# Patient Record
Sex: Female | Born: 1940 | Race: White | Hispanic: No | Marital: Married | Smoking: Former smoker
Health system: Southern US, Community
[De-identification: ages and names within clinical notes are randomized; demographics above are authoritative.]

## PROBLEM LIST (undated history)

## (undated) DIAGNOSIS — D126 Benign neoplasm of colon, unspecified: Secondary | ICD-10-CM

## (undated) DIAGNOSIS — K5909 Other constipation: Secondary | ICD-10-CM

## (undated) DIAGNOSIS — E079 Disorder of thyroid, unspecified: Secondary | ICD-10-CM

## (undated) DIAGNOSIS — K5792 Diverticulitis of intestine, part unspecified, without perforation or abscess without bleeding: Secondary | ICD-10-CM

## (undated) DIAGNOSIS — R51 Headache: Secondary | ICD-10-CM

## (undated) DIAGNOSIS — K317 Polyp of stomach and duodenum: Secondary | ICD-10-CM

## (undated) DIAGNOSIS — I10 Essential (primary) hypertension: Secondary | ICD-10-CM

## (undated) DIAGNOSIS — K8020 Calculus of gallbladder without cholecystitis without obstruction: Secondary | ICD-10-CM

## (undated) DIAGNOSIS — E538 Deficiency of other specified B group vitamins: Secondary | ICD-10-CM

## (undated) DIAGNOSIS — R519 Headache, unspecified: Secondary | ICD-10-CM

## (undated) DIAGNOSIS — K449 Diaphragmatic hernia without obstruction or gangrene: Secondary | ICD-10-CM

## (undated) DIAGNOSIS — M1990 Unspecified osteoarthritis, unspecified site: Secondary | ICD-10-CM

## (undated) DIAGNOSIS — K219 Gastro-esophageal reflux disease without esophagitis: Secondary | ICD-10-CM

## (undated) DIAGNOSIS — E785 Hyperlipidemia, unspecified: Secondary | ICD-10-CM

## (undated) DIAGNOSIS — K5790 Diverticulosis of intestine, part unspecified, without perforation or abscess without bleeding: Secondary | ICD-10-CM

## (undated) HISTORY — DX: Benign neoplasm of colon, unspecified: D12.6

## (undated) HISTORY — DX: Disorder of thyroid, unspecified: E07.9

## (undated) HISTORY — PX: VAGINAL HYSTERECTOMY: SUR661

## (undated) HISTORY — DX: Other constipation: K59.09

## (undated) HISTORY — DX: Unspecified osteoarthritis, unspecified site: M19.90

## (undated) HISTORY — PX: COLON SURGERY: SHX602

## (undated) HISTORY — PX: TONSILLECTOMY: SUR1361

## (undated) HISTORY — DX: Diaphragmatic hernia without obstruction or gangrene: K44.9

## (undated) HISTORY — DX: Calculus of gallbladder without cholecystitis without obstruction: K80.20

## (undated) HISTORY — DX: Diverticulitis of intestine, part unspecified, without perforation or abscess without bleeding: K57.92

## (undated) HISTORY — DX: Hyperlipidemia, unspecified: E78.5

## (undated) HISTORY — DX: Polyp of stomach and duodenum: K31.7

## (undated) HISTORY — DX: Headache, unspecified: R51.9

## (undated) HISTORY — PX: JOINT REPLACEMENT: SHX530

## (undated) HISTORY — DX: Deficiency of other specified B group vitamins: E53.8

## (undated) HISTORY — DX: Gastro-esophageal reflux disease without esophagitis: K21.9

## (undated) HISTORY — DX: Essential (primary) hypertension: I10

## (undated) HISTORY — DX: Headache: R51

## (undated) HISTORY — DX: Diverticulosis of intestine, part unspecified, without perforation or abscess without bleeding: K57.90

---

## 1997-10-02 ENCOUNTER — Ambulatory Visit (HOSPITAL_BASED_OUTPATIENT_CLINIC_OR_DEPARTMENT_OTHER): Admission: RE | Admit: 1997-10-02 | Discharge: 1997-10-02 | Payer: Self-pay | Admitting: Plastic Surgery

## 1998-06-06 ENCOUNTER — Other Ambulatory Visit: Admission: RE | Admit: 1998-06-06 | Discharge: 1998-06-06 | Payer: Self-pay | Admitting: Gastroenterology

## 1999-06-23 ENCOUNTER — Ambulatory Visit (HOSPITAL_COMMUNITY): Admission: RE | Admit: 1999-06-23 | Discharge: 1999-06-23 | Payer: Self-pay | Admitting: Obstetrics and Gynecology

## 1999-06-23 ENCOUNTER — Encounter (INDEPENDENT_AMBULATORY_CARE_PROVIDER_SITE_OTHER): Payer: Self-pay | Admitting: Specialist

## 2000-04-05 HISTORY — PX: ABDOMINAL HYSTERECTOMY: SUR658

## 2000-05-17 ENCOUNTER — Encounter (INDEPENDENT_AMBULATORY_CARE_PROVIDER_SITE_OTHER): Payer: Self-pay | Admitting: Specialist

## 2000-05-17 ENCOUNTER — Ambulatory Visit (HOSPITAL_COMMUNITY): Admission: RE | Admit: 2000-05-17 | Discharge: 2000-05-17 | Payer: Self-pay | Admitting: Internal Medicine

## 2000-05-26 ENCOUNTER — Encounter: Payer: Self-pay | Admitting: Gynecology

## 2000-05-27 ENCOUNTER — Encounter (INDEPENDENT_AMBULATORY_CARE_PROVIDER_SITE_OTHER): Payer: Self-pay | Admitting: Specialist

## 2000-05-27 ENCOUNTER — Inpatient Hospital Stay (HOSPITAL_COMMUNITY): Admission: RE | Admit: 2000-05-27 | Discharge: 2000-05-28 | Payer: Self-pay | Admitting: Gynecology

## 2001-01-12 ENCOUNTER — Encounter: Payer: Self-pay | Admitting: Obstetrics and Gynecology

## 2001-01-12 ENCOUNTER — Encounter: Admission: RE | Admit: 2001-01-12 | Discharge: 2001-01-12 | Payer: Self-pay | Admitting: Obstetrics and Gynecology

## 2001-12-21 ENCOUNTER — Other Ambulatory Visit: Admission: RE | Admit: 2001-12-21 | Discharge: 2001-12-21 | Payer: Self-pay | Admitting: Gynecology

## 2002-04-03 ENCOUNTER — Encounter: Payer: Self-pay | Admitting: Gynecology

## 2002-04-03 ENCOUNTER — Encounter: Admission: RE | Admit: 2002-04-03 | Discharge: 2002-04-03 | Payer: Self-pay | Admitting: Gynecology

## 2002-12-24 ENCOUNTER — Other Ambulatory Visit: Admission: RE | Admit: 2002-12-24 | Discharge: 2002-12-24 | Payer: Self-pay | Admitting: Gynecology

## 2002-12-27 ENCOUNTER — Encounter: Payer: Self-pay | Admitting: Gynecology

## 2002-12-27 ENCOUNTER — Encounter: Admission: RE | Admit: 2002-12-27 | Discharge: 2002-12-27 | Payer: Self-pay | Admitting: Gynecology

## 2003-04-06 HISTORY — PX: TOTAL KNEE ARTHROPLASTY: SHX125

## 2003-10-14 ENCOUNTER — Encounter: Admission: RE | Admit: 2003-10-14 | Discharge: 2003-10-14 | Payer: Self-pay | Admitting: Internal Medicine

## 2003-12-31 ENCOUNTER — Other Ambulatory Visit: Admission: RE | Admit: 2003-12-31 | Discharge: 2003-12-31 | Payer: Self-pay | Admitting: Gynecology

## 2004-01-09 ENCOUNTER — Encounter: Payer: Self-pay | Admitting: Gastroenterology

## 2004-01-16 ENCOUNTER — Encounter: Admission: RE | Admit: 2004-01-16 | Discharge: 2004-01-16 | Payer: Self-pay | Admitting: Gynecology

## 2004-01-29 ENCOUNTER — Encounter (INDEPENDENT_AMBULATORY_CARE_PROVIDER_SITE_OTHER): Payer: Self-pay | Admitting: *Deleted

## 2004-01-29 ENCOUNTER — Encounter: Payer: Self-pay | Admitting: Gastroenterology

## 2004-01-29 ENCOUNTER — Ambulatory Visit (HOSPITAL_COMMUNITY): Admission: RE | Admit: 2004-01-29 | Discharge: 2004-01-29 | Payer: Self-pay | Admitting: Gastroenterology

## 2005-01-06 ENCOUNTER — Other Ambulatory Visit: Admission: RE | Admit: 2005-01-06 | Discharge: 2005-01-06 | Payer: Self-pay | Admitting: Gynecology

## 2005-03-15 ENCOUNTER — Encounter: Admission: RE | Admit: 2005-03-15 | Discharge: 2005-03-15 | Payer: Self-pay | Admitting: Gynecology

## 2006-01-24 ENCOUNTER — Other Ambulatory Visit: Admission: RE | Admit: 2006-01-24 | Discharge: 2006-01-24 | Payer: Self-pay | Admitting: Gynecology

## 2006-03-16 ENCOUNTER — Encounter: Admission: RE | Admit: 2006-03-16 | Discharge: 2006-03-16 | Payer: Self-pay | Admitting: Gynecology

## 2006-04-19 ENCOUNTER — Ambulatory Visit: Payer: Self-pay | Admitting: Gastroenterology

## 2006-04-19 LAB — CONVERTED CEMR LAB
ALT: 13 units/L (ref 0–40)
AST: 16 units/L (ref 0–37)
Albumin: 3.9 g/dL (ref 3.5–5.2)
Alkaline Phosphatase: 21 units/L — ABNORMAL LOW (ref 39–117)
BUN: 21 mg/dL (ref 6–23)
Basophils Absolute: 0 10*3/uL (ref 0.0–0.1)
Basophils Relative: 0.5 % (ref 0.0–1.0)
CO2: 31 meq/L (ref 19–32)
Calcium: 10.4 mg/dL (ref 8.4–10.5)
Chloride: 102 meq/L (ref 96–112)
Creatinine, Ser: 1.3 mg/dL — ABNORMAL HIGH (ref 0.4–1.2)
Eosinophils Relative: 2.7 % (ref 0.0–5.0)
Ferritin: 81.8 ng/mL (ref 10.0–291.0)
Folate: 11.4 ng/mL
GFR calc Af Amer: 53 mL/min
GFR calc non Af Amer: 44 mL/min
Glucose, Bld: 100 mg/dL — ABNORMAL HIGH (ref 70–99)
HCT: 42.4 % (ref 36.0–46.0)
Hemoglobin: 14.2 g/dL (ref 12.0–15.0)
Lymphocytes Relative: 25.4 % (ref 12.0–46.0)
MCHC: 33.4 g/dL (ref 30.0–36.0)
MCV: 92.9 fL (ref 78.0–100.0)
Monocytes Absolute: 0.6 10*3/uL (ref 0.2–0.7)
Monocytes Relative: 9.7 % (ref 3.0–11.0)
Neutro Abs: 3.7 10*3/uL (ref 1.4–7.7)
Neutrophils Relative %: 61.7 % (ref 43.0–77.0)
Platelets: 357 10*3/uL (ref 150–400)
Potassium: 3.6 meq/L (ref 3.5–5.1)
RBC: 4.57 M/uL (ref 3.87–5.11)
RDW: 12.8 % (ref 11.5–14.6)
Sodium: 140 meq/L (ref 135–145)
T4, Total: 10.1 ug/dL (ref 5.0–12.5)
TSH: 1.05 microintl units/mL (ref 0.35–5.50)
Total Bilirubin: 1 mg/dL (ref 0.3–1.2)
Total Protein: 6.7 g/dL (ref 6.0–8.3)
Vitamin B-12: 249 pg/mL (ref 211–911)
WBC: 6 10*3/uL (ref 4.5–10.5)

## 2006-05-04 ENCOUNTER — Ambulatory Visit: Payer: Self-pay | Admitting: Gastroenterology

## 2006-05-05 ENCOUNTER — Ambulatory Visit: Payer: Self-pay | Admitting: Gastroenterology

## 2006-05-05 LAB — CONVERTED CEMR LAB
Albumin: 3.7 g/dL (ref 3.5–5.2)
BUN: 13 mg/dL (ref 6–23)
CO2: 31 meq/L (ref 19–32)
Calcium: 10 mg/dL (ref 8.4–10.5)
Chloride: 103 meq/L (ref 96–112)
Creatinine, Ser: 1 mg/dL (ref 0.4–1.2)
GFR calc Af Amer: 72 mL/min
GFR calc non Af Amer: 60 mL/min
Glucose, Bld: 90 mg/dL (ref 70–99)
Phosphorus: 3.3 mg/dL (ref 2.3–4.6)
Potassium: 3.4 meq/L — ABNORMAL LOW (ref 3.5–5.1)
Sodium: 140 meq/L (ref 135–145)

## 2006-08-09 ENCOUNTER — Inpatient Hospital Stay (HOSPITAL_COMMUNITY): Admission: RE | Admit: 2006-08-09 | Discharge: 2006-08-11 | Payer: Self-pay | Admitting: Orthopedic Surgery

## 2006-08-15 ENCOUNTER — Observation Stay (HOSPITAL_COMMUNITY): Admission: EM | Admit: 2006-08-15 | Discharge: 2006-08-16 | Payer: Self-pay | Admitting: Emergency Medicine

## 2006-08-23 ENCOUNTER — Ambulatory Visit: Payer: Self-pay | Admitting: Gastroenterology

## 2006-08-23 LAB — CONVERTED CEMR LAB
ALT: 63 units/L — ABNORMAL HIGH (ref 0–40)
AST: 35 units/L (ref 0–37)
Albumin: 3.3 g/dL — ABNORMAL LOW (ref 3.5–5.2)
Alkaline Phosphatase: 39 units/L (ref 39–117)
BUN: 14 mg/dL (ref 6–23)
Basophils Absolute: 0 10*3/uL (ref 0.0–0.1)
Basophils Relative: 0.5 % (ref 0.0–1.0)
Bilirubin, Direct: 0.1 mg/dL (ref 0.0–0.3)
CO2: 32 meq/L (ref 19–32)
Calcium: 10.2 mg/dL (ref 8.4–10.5)
Chloride: 103 meq/L (ref 96–112)
Creatinine, Ser: 1 mg/dL (ref 0.4–1.2)
Eosinophils Absolute: 0.2 10*3/uL (ref 0.0–0.6)
Eosinophils Relative: 2.7 % (ref 0.0–5.0)
Folate: 10.6 ng/mL
GFR calc Af Amer: 72 mL/min
GFR calc non Af Amer: 59 mL/min
Glucose, Bld: 101 mg/dL — ABNORMAL HIGH (ref 70–99)
HCT: 33.5 % — ABNORMAL LOW (ref 36.0–46.0)
Hemoglobin: 11.6 g/dL — ABNORMAL LOW (ref 12.0–15.0)
Lymphocytes Relative: 14.2 % (ref 12.0–46.0)
MCHC: 34.7 g/dL (ref 30.0–36.0)
MCV: 92.5 fL (ref 78.0–100.0)
Monocytes Absolute: 0.8 10*3/uL — ABNORMAL HIGH (ref 0.2–0.7)
Monocytes Relative: 10.6 % (ref 3.0–11.0)
Neutro Abs: 5.7 10*3/uL (ref 1.4–7.7)
Neutrophils Relative %: 72 % (ref 43.0–77.0)
Platelets: 682 10*3/uL — ABNORMAL HIGH (ref 150–400)
Potassium: 4 meq/L (ref 3.5–5.1)
RBC: 3.62 M/uL — ABNORMAL LOW (ref 3.87–5.11)
RDW: 12.3 % (ref 11.5–14.6)
Sodium: 140 meq/L (ref 135–145)
TSH: 1.13 microintl units/mL (ref 0.35–5.50)
Total Bilirubin: 0.7 mg/dL (ref 0.3–1.2)
Total Protein: 6.6 g/dL (ref 6.0–8.3)
Vitamin B-12: 341 pg/mL (ref 211–911)
WBC: 7.8 10*3/uL (ref 4.5–10.5)

## 2006-08-25 ENCOUNTER — Ambulatory Visit: Payer: Self-pay | Admitting: Internal Medicine

## 2006-09-06 ENCOUNTER — Ambulatory Visit: Payer: Self-pay | Admitting: Vascular Surgery

## 2006-10-18 ENCOUNTER — Ambulatory Visit: Payer: Self-pay | Admitting: Gastroenterology

## 2007-04-04 ENCOUNTER — Inpatient Hospital Stay (HOSPITAL_COMMUNITY): Admission: RE | Admit: 2007-04-04 | Discharge: 2007-04-06 | Payer: Self-pay | Admitting: Orthopedic Surgery

## 2007-07-12 ENCOUNTER — Encounter: Admission: RE | Admit: 2007-07-12 | Discharge: 2007-07-12 | Payer: Self-pay | Admitting: Gynecology

## 2007-07-21 DIAGNOSIS — K5730 Diverticulosis of large intestine without perforation or abscess without bleeding: Secondary | ICD-10-CM | POA: Insufficient documentation

## 2007-07-21 DIAGNOSIS — M129 Arthropathy, unspecified: Secondary | ICD-10-CM | POA: Insufficient documentation

## 2007-07-21 DIAGNOSIS — K449 Diaphragmatic hernia without obstruction or gangrene: Secondary | ICD-10-CM | POA: Insufficient documentation

## 2007-07-21 DIAGNOSIS — K5909 Other constipation: Secondary | ICD-10-CM | POA: Insufficient documentation

## 2007-07-21 DIAGNOSIS — K219 Gastro-esophageal reflux disease without esophagitis: Secondary | ICD-10-CM | POA: Insufficient documentation

## 2007-08-23 ENCOUNTER — Telehealth: Payer: Self-pay | Admitting: Gastroenterology

## 2008-07-24 ENCOUNTER — Encounter: Admission: RE | Admit: 2008-07-24 | Discharge: 2008-07-24 | Payer: Self-pay | Admitting: Gynecology

## 2009-10-13 ENCOUNTER — Ambulatory Visit: Payer: Self-pay | Admitting: Gastroenterology

## 2009-10-13 DIAGNOSIS — E538 Deficiency of other specified B group vitamins: Secondary | ICD-10-CM | POA: Insufficient documentation

## 2009-10-13 LAB — CONVERTED CEMR LAB
ALT: 31 units/L (ref 0–35)
AST: 26 units/L (ref 0–37)
Albumin: 4.1 g/dL (ref 3.5–5.2)
Alkaline Phosphatase: 32 units/L — ABNORMAL LOW (ref 39–117)
BUN: 23 mg/dL (ref 6–23)
Basophils Absolute: 0 10*3/uL (ref 0.0–0.1)
Basophils Relative: 0.4 % (ref 0.0–3.0)
Bilirubin, Direct: 0.1 mg/dL (ref 0.0–0.3)
CO2: 30 meq/L (ref 19–32)
Calcium: 10.2 mg/dL (ref 8.4–10.5)
Chloride: 110 meq/L (ref 96–112)
Creatinine, Ser: 0.9 mg/dL (ref 0.4–1.2)
Eosinophils Absolute: 0.2 10*3/uL (ref 0.0–0.7)
Eosinophils Relative: 5.4 % — ABNORMAL HIGH (ref 0.0–5.0)
Ferritin: 58 ng/mL (ref 10.0–291.0)
Folate: 11 ng/mL
GFR calc non Af Amer: 69.9 mL/min (ref >60)
Glucose, Bld: 96 mg/dL (ref 70–99)
HCT: 39.5 % (ref 36.0–46.0)
Hemoglobin: 13.6 g/dL (ref 12.0–15.0)
Iron: 104 ug/dL (ref 42–145)
Lymphocytes Relative: 30.2 % (ref 12.0–46.0)
Lymphs Abs: 1.4 10*3/uL (ref 0.7–4.0)
MCHC: 34.4 g/dL (ref 30.0–36.0)
MCV: 93.9 fL (ref 78.0–100.0)
Monocytes Absolute: 0.6 10*3/uL (ref 0.1–1.0)
Monocytes Relative: 13.1 % — ABNORMAL HIGH (ref 3.0–12.0)
Neutro Abs: 2.3 10*3/uL (ref 1.4–7.7)
Neutrophils Relative %: 50.9 % (ref 43.0–77.0)
Platelets: 310 10*3/uL (ref 150.0–400.0)
Potassium: 4.8 meq/L (ref 3.5–5.1)
RBC: 4.21 M/uL (ref 3.87–5.11)
RDW: 13.6 % (ref 11.5–14.6)
Saturation Ratios: 30.6 % (ref 20.0–50.0)
Sodium: 143 meq/L (ref 135–145)
TSH: 0.89 microintl units/mL (ref 0.35–5.50)
Total Bilirubin: 0.7 mg/dL (ref 0.3–1.2)
Total Protein: 6.8 g/dL (ref 6.0–8.3)
Transferrin: 242.8 mg/dL (ref 212.0–360.0)
Vitamin B-12: 245 pg/mL (ref 211–911)
WBC: 4.5 10*3/uL (ref 4.5–10.5)

## 2009-10-15 ENCOUNTER — Ambulatory Visit: Payer: Self-pay | Admitting: Gastroenterology

## 2009-10-22 ENCOUNTER — Ambulatory Visit: Payer: Self-pay | Admitting: Gastroenterology

## 2009-10-28 ENCOUNTER — Encounter (INDEPENDENT_AMBULATORY_CARE_PROVIDER_SITE_OTHER): Payer: Self-pay | Admitting: *Deleted

## 2009-10-28 ENCOUNTER — Ambulatory Visit: Payer: Self-pay | Admitting: Gastroenterology

## 2009-10-28 DIAGNOSIS — L03039 Cellulitis of unspecified toe: Secondary | ICD-10-CM | POA: Insufficient documentation

## 2009-12-03 ENCOUNTER — Ambulatory Visit: Payer: Self-pay | Admitting: Gastroenterology

## 2009-12-05 ENCOUNTER — Encounter: Payer: Self-pay | Admitting: Gastroenterology

## 2010-03-03 ENCOUNTER — Encounter: Admission: RE | Admit: 2010-03-03 | Discharge: 2010-03-03 | Payer: Self-pay | Admitting: Gynecology

## 2010-04-26 ENCOUNTER — Encounter: Payer: Self-pay | Admitting: Internal Medicine

## 2010-05-05 NOTE — Procedures (Signed)
Summary: Colonoscopy  Patient: Pamela Snow Note: All result statuses are Final unless otherwise noted.  Tests: (1) Colonoscopy (COL)   COL Colonoscopy           DONE     [facility]     [address]     [place], [place]  [address]           COLONOSCOPY PROCEDURE REPORT           PATIENT:  Pamela Snow, Pamela Snow  MR#:  [id number]     BIRTHDATE:  [birth year], 69 yrs. old  GENDER:  female     ENDOSCOPIST:  Vania Rea. Jarold Motto, MD, [facility]     REF. BY:     PROCEDURE DATE:  12/03/2009     PROCEDURE:  Colonoscopy with biopsy, Colonoscopy with biopsy and     snare polypectomy     ASA CLASS:  Class II     INDICATIONS:  family history of colon cancer     MEDICATIONS:   Fentanyl 100 mcg IV, Versed 10 mg IV           DESCRIPTION OF PROCEDURE:   After the risks benefits and     alternatives of the procedure were thoroughly explained, informed     consent was obtained.  Digital rectal exam was performed and     revealed no abnormalities.   The LB [id number] endoscope     was introduced through the anus and advanced to the cecum, which     was identified by both the appendix and ileocecal valve, limited     by a redundant colon.  VERY REDUNDANT AND TORTUOUS     COLON,ESPECIALLY THE SIGMOID AREA.  The quality of the prep was     excellent, using Osmoprep.  The instrument was then slowly     withdrawn as the colon was fully examined.     <<PROCEDUREIMAGES>>           FINDINGS:  Mild diverticulosis was found in the sigmoid to     descending colon segments.  A sessile polyp was found in the     descending colon. FLAT POLYP HOT SNARE EXCISED.2 OTHER     DIMINUTIVE POLYS,,,1 COLD SNARE EXCISED,AND 1 CAUTERIZED IN     SIGMIOD AREA.  This was otherwise a normal examination of the     colon.   Retroflexed views in the rectum revealed no     abnormalities.    The scope was then withdrawn from the patient     and the procedure completed.           COMPLICATIONS:  None  ENDOSCOPIC IMPRESSION:     1) Mild diverticulosis in the sigmoid to descending colon     segments     2) Sessile polyp in the descending colon     3) Otherwise normal examination     R/O ADENOMAS     RECOMMENDATIONS:     1) Follow up colonoscopy in 5 years     REPEAT EXAM:  No           ______________________________     Vania Rea. Jarold Motto, MD, [facility]           CC:  Creola Corn, MD           n.     Rosalie Doctor:   Vania Rea. Asser Lucena at 12/03/2009 11:56 AM           Robinette Haines, [id number]  Note: An  exclamation mark (!) indicates a result that was not dispersed into the flowsheet. Document Creation Date: 12/03/2009 11:57 AM _______________________________________________________________________  (1) Order result status: Final Collection or observation date-time: 12/03/2009 11:49 Requested date-time:  Receipt date-time:  Reported date-time:  Referring Physician:   Ordering Physician: Sheryn Bison [phone]) Specimen Source:  Source: Launa Grill Order Number: [id number] Lab site:   Appended Document: Colonoscopy     Procedures Next Due Date:    Colonoscopy: 12/2012

## 2010-05-05 NOTE — Assessment & Plan Note (Signed)
Summary: #2 of 3 weekly B12/dfs  Nurse Visit   Allergies: No Known Drug Allergies  Medication Administration  Injection # 1:    Medication: Vit B12 1000 mcg    Diagnosis: VITAMIN B12 DEFICIENCY (ICD-266.2)    Route: IM    Site: R deltoid    Exp Date: 05/2011    Lot #: [id number]    Mfr: [employer]    Patient tolerated injection without complications    Given by: Milford Cage NCMA (October 22, 2009 9:20 AM)  Orders Added: 1)  Vit B12 1000 mcg [J3420]

## 2010-05-05 NOTE — Letter (Signed)
Summary: Patient Notice-Endo Biopsy Results  [facility]  [address] [place], [place] [address]   Phone: [phone]  Fax: [phone]        December 05, 2009 MRN: [id number]    JANIQUA FRISCIA [address] [place], [place]  [address]    Dear Lynden Ang, I am pleased to inform you that the biopsies taken during your recent endoscopic examination did not show any evidence of cancer upon pathologic examination.  Additional information/recommendations:  _x_No further action is needed at this time.  Please follow-up with      your primary care physician for your other healthcare needs.  __ Please call [phone] to schedule a return visit to review      your condition.  __ Continue with the treatment plan as outlined on the day of your      exam.  __ You should have a repeat endoscopic examination for this problem              in _ months/years.   Please call us if you are having persistent problems or have questions about your condition that have not been fully answered at this time.  Sincerely,  Mardella Layman MD [facility]  This letter has been electronically signed by your physician.  Appended Document: Patient Notice-Endo Biopsy Results letter mailed

## 2010-05-05 NOTE — Assessment & Plan Note (Signed)
Summary: #1 of 3 weekly B12/dfs  Nurse Visit   Allergies: No Known Drug Allergies  Medication Administration  Injection # 1:    Medication: Vit B12 1000 mcg    Diagnosis: VITAMIN B12 DEFICIENCY (ICD-266.2)    Route: IM    Site: L deltoid    Exp Date: 05/07/2011    Lot #: [id number]    Mfr: [employer]    Patient tolerated injection without complications    Given by: Harlow Mares CMA (AAMA) (October 15, 2009 9:12 AM)  Appended Document: #1 of 3 weekly B12/dfs needes ov

## 2010-05-05 NOTE — Miscellaneous (Signed)
Summary: OSP Consent Form/[facility]  OSP Consent Form/[facility]   Imported By: Sherian Rein 11/04/2009 11:30:46  _____________________________________________________________________  External Attachment:    Type:   Image     Comment:   External Document

## 2010-05-05 NOTE — Letter (Signed)
Summary: Patient Notice- Polyp Results  [facility]  [address] [place], [place] [address]   Phone: [phone]  Fax: [phone]        December 05, 2009 MRN: [id number]    Pamela Snow [address] [place], [place]  [address]    Dear [name],  I am pleased to inform you that the colon polyp(s) removed during your recent colonoscopy was (were) found to be benign (no cancer detected) upon pathologic examination.  I recommend you have a repeat colonoscopy examination in 3_ years to look for recurrent polyps, as having colon polyps increases your risk for having recurrent polyps or even colon cancer in the future.  Should you develop new or worsening symptoms of abdominal pain, bowel habit changes or bleeding from the rectum or bowels, please schedule an evaluation with either your primary care physician or with me.  Additional information/recommendations:  _x_ No further action with gastroenterology is needed at this time. Please      follow-up with your primary care physician for your other healthcare      needs.  __ Please call [phone] to schedule a return visit to review your      situation.  __ Please keep your follow-up visit as already scheduled.  __ Continue treatment plan as outlined the day of your exam.  Please call us if you are having persistent problems or have questions about your condition that have not been fully answered at this time.  Sincerely,  Mardella Layman MD [facility]  This letter has been electronically signed by your physician.  Appended Document: Patient Notice- Polyp Results letter mailed

## 2010-05-05 NOTE — Procedures (Signed)
Summary: Gastroenterology colon  Gastroenterology colon   Imported By: Donneta Romberg 07/22/2007 10:45:31  _____________________________________________________________________  External Attachment:    Type:   Image     Comment:   External Document

## 2010-05-05 NOTE — Letter (Signed)
Summary: OSP Based Product Consent Form  [facility]  [address] [place], [place] [address]   Phone: [phone]  Fax: [phone]      October 28, 2009 MRN: [id number] [name]  [facility]  OSP BASED PRODUCT CONSENT FORM  I understand that in order to prepare myself for an examination of my large bowel during a colonoscpoy I must undergo a bowel cleansing prior to the procedure. This is to ensure that my bowel is as clean as possible so the physician performing the procedure can thoroughly examine the lining of my colon to detect polyps or other abnormalities.  I have beeen informed that the FDA has become aware of reports of acute phosphate nephropathy, a type of acute kidney injury, associated with the use of oral sodium phosphate based products (OPS) for bowel cleansing prior to colonoscopy and other procedures. These rare but serious events have occurred in some patients wothout identifible factors that would put them at risk for developing acute kidney injury. These products include the prescription products, OsmoPrep and Visicol, as well as previously available over-the-counter laxatives (e.g., Fleet Phospho-soda).  Prior caution should be taken in patients with a history of renal insufficiency, advanced age, or those taking diuretics or certain blood pressure medications. The use of OSP is contraindicated in patients with advanced renal disease or a history of heart failure.  Alternative preparations for bowel cleansing recommended by my physician include: MoviPrep, Golytely, HalfLytely, Miralax, or other PEG (polyethylene glycol) based products which, to date, have not been associated with these types od adverse events.  The above risks and alternatives have been fully expained to me and I am aware that by taking OsmoPrep, or like products, an acute kidney injury may occur without warning. Despite this warning, I have chosen to use a phosphate-based  regime for bowel cleansing prior to my colonoscopy and will assume responsibility for any adverse effect that I may experience from this product. I also understand the importance of vigorous hydration before and immediately following my procedure.     Instructed by:________________________________     Signed:________________________________________   Date:_______________   Original 06/06/07

## 2010-05-05 NOTE — Assessment & Plan Note (Signed)
Summary: Recheck/B12 inj/dfs   History of Present Illness Visit Type: follow up  Primary GI MD: Sheryn Bison MD FACP FAGA Primary Provider: [name], MD  Requesting Provider: na Chief Complaint: F/u for chronic constipation and pt denies any GI complaints. Need B12 injection  History of Present Illness:   70 year old Caucasian female with a strong family history of colon cancer in her father and her sister who has continued chronic constipation requiring daily MiraLax use. She also has chronic acid reflux and is on daily Nexium 40 mg. She currently denies GI complaints. She's had a recent traumatic injury to her left great toe nail. She is postmenopausal is on estrogen therapy and Lexapro 10 mg a day for depression. Recent lab data was normal except for a borderline low B12 level.She currently is on parenteral B12 replacement therapy. She denies peripheral neuropathy symptomatology or any mental status changes. She does take a daily Ecotrin tablet also.   GI Review of Systems      Denies abdominal pain, acid reflux, belching, bloating, chest pain, dysphagia with liquids, dysphagia with solids, heartburn, loss of appetite, nausea, vomiting, vomiting blood, weight loss, and  weight gain.        Denies anal fissure, black tarry stools, change in bowel habit, constipation, diarrhea, diverticulosis, fecal incontinence, heme positive stool, hemorrhoids, irritable bowel syndrome, jaundice, light color stool, liver problems, rectal bleeding, and  rectal pain.    Current Medications (verified): 1)  Ecotrin 325 Mg Tbec (Aspirin) .... As Needed For Pain and Headaches 2)  Lexapro 10 Mg Tabs (Escitalopram Oxalate) .... One Tablet By Mouth Once Daily 3)  Nexium 40 Mg Cpdr (Esomeprazole Magnesium) .... One Tablet By Mouth Once Daily 4)  Synthroid 50 Mcg Tabs (Levothyroxine Sodium) .... One Tablet By Mouth Once Daily 5)  Estrogen Patch 0.375 .... Two Times A Week 6)  Calcium-Vitamin D 250-125  Mg-Unit Tabs (Calcium Carbonate-Vitamin D) .... One Tablet By Mouth Once Daily 7)  Miralax  Powd (Polyethylene Glycol 3350) .... Once Daily  Allergies (verified): 1)  ! Penicillin 2)  ! * Mycins  Past History:  Past medical, surgical, family and social histories (including risk factors) reviewed for relevance to current acute and chronic problems.  Past Medical History: Reviewed history from 07/21/2007 and no changes required. Current Problems:  HIATAL HERNIA (ICD-553.3) DIVERTICULOSIS, ASYMPTOMATIC (ICD-562.10) CONSTIPATION, CHRONIC (ICD-564.09) GERD (ICD-530.81) ARTHRITIS (ICD-716.90)  Past Surgical History: Reviewed history from 07/21/2007 and no changes required. knee replacement rt.  Family History: Reviewed history and no changes required. Family History of Colon Cancer: Mother and Sister   Social History: Reviewed history and no changes required. Occupation: Retired Married Patient is a former smoker.  Alcohol Use - yes Illicit Drug Use - no Smoking Status:  quit Drug Use:  no  Review of Systems       The patient complains of arthritis/joint pain.  The patient denies allergy/sinus, anemia, anxiety-new, back pain, blood in urine, breast changes/lumps, change in vision, confusion, cough, coughing up blood, depression-new, fainting, fatigue, fever, headaches-new, hearing problems, heart murmur, heart rhythm changes, itching, menstrual pain, muscle pains/cramps, night sweats, nosebleeds, pregnancy symptoms, shortness of breath, skin rash, sleeping problems, sore throat, swelling of feet/legs, swollen lymph glands, thirst - excessive, urination - excessive, urination changes/pain, urine leakage, vision changes, and voice change.    Vital Signs:  Patient profile:   70 year old female Pulse rate:   68 / minute Pulse rhythm:   regular BP sitting:   122 /  76  (left arm) Cuff size:   regular  Vitals Entered By: Ok Anis CMA (October 28, 2009 11:20 AM)  Physical  Exam  General:  Well developed, well nourished, no acute distress.healthy appearing.   Head:  Normocephalic and atraumatic. Eyes:  PERRLA, no icterus. Neck:  Supple; no masses or thyromegaly. Lungs:  Clear throughout to auscultation. Heart:  Regular rate and rhythm; no murmurs, rubs,  or bruits. Abdomen:  Soft, nontender and nondistended. No masses, hepatosplenomegaly or hernias noted. Normal bowel sounds. Rectal:  deferred until time of colonoscopy.   Msk:  Symmetrical with no gross deformities. Normal posture.Left large toe with missing toe nail obviously resected to dermal margin. There is no tenderness or exudate or evidence of cellulitis. Pulses:  Normal pulses noted. Extremities:  No clubbing, cyanosis, edema or deformities noted. Cervical Nodes:  No significant cervical adenopathy. Psych:  Alert and cooperative. Normal mood and affect.   Impression & Recommendations:  Problem # 1:  CONSTIPATION, CHRONIC (ICD-564.09) Assessment Improved Continue daily MiraLax as tolerated. Followup colonoscopy scheduled at her convenience.  Problem # 2:  GERD (ICD-530.81) Assessment: Improved Continue reflex regime with followup endoscopy at her request  Problem # 3:  VITAMIN B12 DEFICIENCY (ICD-266.2) Assessment: Improved Weekly nasal B12 spray suggested  Problem # 4:  PARONYCHIA, TOE (ICD-681.11) Assessment: Improved Continue local care and Neosporin application. She is to call Dr. Timothy Lasso should any evidence of infection ensue.  Other Orders: Vit B12 1000 mcg (U9811)  Patient Instructions: 1)  You are scheduled for an upper endoscopy and a colonoscopy. 2)  Begin Nascobal once a week. 3)  The medication list was reviewed and reconciled.  All changed / newly prescribed medications were explained.  A complete medication list was provided to the patient / caregiver. 4)  Copy sent to : Dr. Creola Corn 5)  Please continue current medications.  6)  Constipation and Hemorrhoids brochure  given.  7)  Colonoscopy and Flexible Sigmoidoscopy brochure given.  8)  Conscious Sedation brochure given.  9)  Upper Endoscopy brochure given.  Prescriptions: NASCOBAL 500 MCG/0.1ML SOLN (CYANOCOBALAMIN) 1 spray weekly  #1 x 6   Entered by:   Ashok Cordia RN   Authorized by:   Mardella Layman MD [facility]   Signed by:   Ashok Cordia RN on 10/28/2009   Method used:   Print then Give to Patient   RxID:   [id number]    Medication Administration  Injection # 1:    Medication: Vit B12 1000 mcg    Diagnosis: VITAMIN B12 DEFICIENCY (ICD-266.2)    Route: IM    Site: L deltoid    Exp Date: 5/13    Lot #: [id number]    Mfr: [employer]    Patient tolerated injection without complications    Given by: Milford Cage NCMA (October 28, 2009 12:09 PM)  Orders Added: 1)  Vit B12 1000 mcg [J3420]  Appended Document: Recheck/B12 inj/dfs    Clinical Lists Changes  Medications: Added new medication of OSMOPREP 1.102-0.398 GM  TABS (SOD PHOS MONO-SOD PHOS DIBASIC) As per prep instructions. - Signed Rx of OSMOPREP 1.102-0.398 GM  TABS (SOD PHOS MONO-SOD PHOS DIBASIC) As per prep instructions.;  #32 x 0;  Signed;  Entered by: Ashok Cordia RN;  Authorized by: Mardella Layman MD [facility];  Method used: Electronically to [facility] [address] [phone]*, [address] [place], [place], [place]  [address], Ph: [phone] or [phone], Fax: [phone] Orders: Added new Test order of  Colon/Endo (Colon/Endo) - Signed    Prescriptions: OSMOPREP 1.102-0.398 GM  TABS (SOD PHOS MONO-SOD PHOS DIBASIC) As per prep instructions.  #32 x 0   Entered by:   Ashok Cordia RN   Authorized by:   Mardella Layman MD [facility]   Signed by:   Ashok Cordia RN on 10/28/2009   Method used:   Electronically to        [facility] [address] [facility]* (retail)       [address]       [place], [place]  [address]       Ph: [phone] or [phone]       Fax: [phone]   RxID:    [id number]    Appended Document: Recheck/B12 inj/dfs did she schedule her endoscopy and colonoscopy ??  Appended Document: Recheck/B12 inj/dfs Scheduled for 12/03/09

## 2010-05-05 NOTE — Procedures (Signed)
Summary: Upper Endoscopy  Patient: Pamela Snow Note: All result statuses are Final unless otherwise noted.  Tests: (1) Upper Endoscopy (EGD)   EGD Upper Endoscopy       DONE     [facility]     [address]     [place], [place]  [address]           ENDOSCOPY PROCEDURE REPORT           PATIENT:  [name], [name]  MR#:  [id number]     BIRTHDATE:  Jul 26, 1940, 69 yrs. old  GENDER:  female           ENDOSCOPIST:  Vania Rea. Jarold Motto, MD, [facility]     Referred by:           PROCEDURE DATE:  12/03/2009     PROCEDURE:  EGD with biopsy     ASA CLASS:  Class II     INDICATIONS:  CHRONIC GERD           MEDICATIONS:   There was residual sedation effect present from     prior procedure., Versed 3 mg IV     TOPICAL ANESTHETIC:           DESCRIPTION OF PROCEDURE:   After the risks benefits and     alternatives of the procedure were thoroughly explained, informed     consent was obtained.  The LB GIF-H180 [id number] endoscope was     introduced through the mouth and advanced to the second portion of     the duodenum, without limitations.  The instrument was slowly     withdrawn as the mucosa was fully examined.     <<PROCEDUREIMAGES>>           Mild gastritis was found in the fundus. MILD ERYTHEMA AND A FEW     EROSIONS BIOPSIED.ALSO REMOVED WERE BENIGN FUNDAL POLYPS.  Normal     duodenal folds were noted.  The esophagus and gastroesophageal     junction were completely normal in appearance.    Retroflexed     views revealed no masses.    The scope was then withdrawn from the     patient and the procedure completed.           COMPLICATIONS:  None           ENDOSCOPIC IMPRESSION:     1) Mild gastritis in the fundus     2) Normal duodenal folds     3) Normal esophagus     4) No masses     1.R/O H.PYLORI     2.PPI INDUCED HYPERPLASTIC FUNDAL POLYPS     3.NO BARRETT'S MUCOSA NOTED.     RECOMMENDATIONS:     1) Await biopsy results     2) Rx CLO if positive     3) continue  PPI           REPEAT EXAM:  No           ______________________________     Vania Rea. Jarold Motto, MD, [facility]           CC:  Creola Corn, MD           n.     Rosalie Doctor:   Vania Rea. Ahleah Simko at 12/03/2009 12:12 PM           Robinette Haines, [phone]  Note: An exclamation mark (!) indicates a result that was not dispersed into the flowsheet. Document Creation Date: 12/03/2009 12:14 PM _______________________________________________________________________  (  1) Order result status: Final Collection or observation date-time: 12/03/2009 12:05 Requested date-time:  Receipt date-time:  Reported date-time:  Referring Physician:   Ordering Physician: Sheryn Bison [phone]) Specimen Source:  Source: Launa Grill Order Number: [id number] Lab site:

## 2010-05-05 NOTE — Letter (Signed)
Summary: [facility] Instructions  [facility]  [address] [place], [place] [address]   Phone: [phone]  Fax: [phone]       Pamela Snow    1940/10/16    MRN: [id number]        Procedure Day Dorna Bloom:  Wednesday, 12/03/09     Arrival Time: 10:00      Procedure Time: 11:00     Location of Procedure:                    _X _  [facility] (4th Floor)     PREPARATION FOR COLONOSCOPY WITH OSMOPREP  Starting 5 days prior to your procedure 11/28/09 do not eat nuts, seeds, popcorn, corn, beans, peas,  salads, or any raw vegetables.  Do not take any fiber supplements (e.g. Metamucil, Citrucel, and Benefiber). _________________________________________________________________________________________________  THE DAY BEFORE YOUR PROCEDURE             DATE: 12/02/09    DAY: Tuesday  1.   Drink clear liquids the entire day - NO SOLID FOOD.  Drink at least 64 oz. of fluid during the day to prevent hydration and help the prep work efficiently.    2.   Do not drink anything colored red or purple.  Avoid juices with pulp.  No orange juice.              CLEAR LIQUIDS INCLUDE: Water Jello Ice Popsicles Tea (sugar ok, no milk/cream) Powdered fruit flavored drinks Coffee (sugar ok, no milk/cream) Gatorade Juice: apple, white grape, white cranberry  Lemonade Clear bullion, consomm, broth Carbonated beverages (any kind) Strained chicken noodle soup Hard Candy   3.   Beginning at 5:00 p.m. or 6:00 p.m. the night before your procedure, drink one dose (4 tablets with 8 oz. of any clear liquid) every 15 minutes for a total of 5 doses (20 tablets total).  ___________________________________________________________________________________________________   THE DAY OF YOUR PROCEDURE            DATE: 12/03/09  DAY: Wednesday  1.   Beginning at 6:00 (5 hours before procedure), drink one dose (4 tablets with 8 oz. of any clear liquid) every 15 minutes for a total  of 3 doses (12 tablets).  2.   You may drink clear liquids until 9:00 (2 hours before exam).  Do not drink anything after this time.       MEDICATION INSTRUCTIONS  Unless otherwise instructed, you should take regular prescription medications with a small sip of water as early as possible the morning of your procedure.                   OTHER INSTRUCTIONS  You will need a responsible adult at least 68 years of age to accompany you and drive you home.   This person must remain in the waiting room during your procedure.  Wear loose fitting clothing that is easily removed.  Leave jewelry and other valuables at home.  However, you may wish to bring a book to read or an iPod/MP3 player to listen to music as you wait for your procedure to start.  Remove all body piercing jewelry and leave at home.  Total time from sign-in until discharge is approximately 2-3 hours.  You should go home directly after your procedure and rest.  You can resume normal activities the day after your procedure.  The day of your procedure you should not:   Drive   Make legal  decisions   Operate machinery   Drink alcohol   Return to work  You will receive specific instructions about eating, activities and medications before you leave.   The above instructions have been reviewed and explained to me by   _______________________   I fully understand and can verbalize these instructions _____________________________ Date _________

## 2010-05-05 NOTE — Progress Notes (Signed)
Summary: Pt wants to sched EGD & Colon  Medications Added DARVOCET-N 100 100-650 MG TABS (PROPOXYPHENE N-APAP) Take 1 tablet by mouth every twelve  hours LEVSIN 0.125 MG TABS (HYOSCYAMINE SULFATE) Take 1 tablet by mouth every six hours      Allergies Added: NKDA Phone Note Call from Patient Call back at [phone]   Call For: [facility] Summary of Call: Pt having difficutly swallowing, mother & sister have colon ca, pt wants to schedulle EGD & Colon. Is this okay? Reminder in [facility] for 2013 for colon. Please review. Patient's chart has been requested.  Initial call taken by: Verdell Face,  Aug 23, 2007 1:06 PM  Follow-up for Phone Call        pt ? when last colon and endo was Dr. Timothy Lasso told the pt that it was more than 5 years. advised pt she had both procedures done on 05-04-2006. pt was ok with that and now needs nothing Follow-up by: Harlow Mares CMA,  Aug 23, 2007 2:46 PM    New/Updated Medications: DARVOCET-N 100 100-650 MG TABS (PROPOXYPHENE N-APAP) Take 1 tablet by mouth every twelve  hours LEVSIN 0.125 MG TABS (HYOSCYAMINE SULFATE) Take 1 tablet by mouth every six hours

## 2010-07-13 ENCOUNTER — Telehealth: Payer: Self-pay | Admitting: Gastroenterology

## 2010-07-13 ENCOUNTER — Ambulatory Visit (INDEPENDENT_AMBULATORY_CARE_PROVIDER_SITE_OTHER): Payer: Medicare Other | Admitting: Physician Assistant

## 2010-07-13 ENCOUNTER — Encounter: Payer: Self-pay | Admitting: Physician Assistant

## 2010-07-13 ENCOUNTER — Other Ambulatory Visit (INDEPENDENT_AMBULATORY_CARE_PROVIDER_SITE_OTHER): Payer: Medicare Other

## 2010-07-13 VITALS — BP 134/86 | HR 62

## 2010-07-13 DIAGNOSIS — R1032 Left lower quadrant pain: Secondary | ICD-10-CM

## 2010-07-13 DIAGNOSIS — K5732 Diverticulitis of large intestine without perforation or abscess without bleeding: Secondary | ICD-10-CM

## 2010-07-13 DIAGNOSIS — D126 Benign neoplasm of colon, unspecified: Secondary | ICD-10-CM

## 2010-07-13 LAB — CBC WITH DIFFERENTIAL/PLATELET
Basophils Absolute: 0 10*3/uL (ref 0.0–0.1)
Basophils Relative: 0.3 % (ref 0.0–3.0)
Eosinophils Absolute: 0.1 10*3/uL (ref 0.0–0.7)
Eosinophils Relative: 2.4 % (ref 0.0–5.0)
HCT: 38.2 % (ref 36.0–46.0)
Hemoglobin: 12.9 g/dL (ref 12.0–15.0)
Lymphocytes Relative: 30.8 % (ref 12.0–46.0)
Lymphs Abs: 1.8 10*3/uL (ref 0.7–4.0)
MCHC: 33.9 g/dL (ref 30.0–36.0)
MCV: 94.2 fl (ref 78.0–100.0)
Monocytes Absolute: 0.6 10*3/uL (ref 0.1–1.0)
Monocytes Relative: 9.7 % (ref 3.0–12.0)
Neutro Abs: 3.2 10*3/uL (ref 1.4–7.7)
Neutrophils Relative %: 56.8 % (ref 43.0–77.0)
Platelets: 339 10*3/uL (ref 150.0–400.0)
RBC: 4.05 Mil/uL (ref 3.87–5.11)
RDW: 12.5 % (ref 11.5–14.6)
WBC: 5.7 10*3/uL (ref 4.5–10.5)

## 2010-07-13 LAB — BASIC METABOLIC PANEL
BUN: 18 mg/dL (ref 6–23)
CO2: 29 mEq/L (ref 19–32)
Calcium: 10.4 mg/dL (ref 8.4–10.5)
Chloride: 101 mEq/L (ref 96–112)
Creatinine, Ser: 0.8 mg/dL (ref 0.4–1.2)
GFR: 76.92 mL/min (ref >60.00)
Glucose, Bld: 85 mg/dL (ref 70–99)
Potassium: 4.8 mEq/L (ref 3.5–5.1)
Sodium: 138 mEq/L (ref 135–145)

## 2010-07-13 MED ORDER — CIPROFLOXACIN HCL 500 MG PO TABS: 500.0000 mg | ORAL_TABLET | Freq: Two times a day (BID) | ORAL | Status: DC

## 2010-07-13 MED ORDER — METRONIDAZOLE 500 MG PO TABS: 500.0000 mg | ORAL_TABLET | Freq: Two times a day (BID) | ORAL | Status: AC

## 2010-07-13 MED ORDER — HYDROCODONE-ACETAMINOPHEN 5-500 MG PO TABS: 1.0000 | ORAL_TABLET | Freq: Every day | ORAL | Status: DC

## 2010-07-13 NOTE — Telephone Encounter (Signed)
Pt c/o new onset of lower abdominal pain. Hx of constipation, HH, and gerd. ENDO 01/2004 with Polyps, Stricture, HH; COLON showing Diverticulosis. Repeat ECL with Gastritis, Tubular Adenoma in COLON.  Pain is described as pressure and tenderness to her lower abdomen just above the pubic area; she also thinks her abdomen is distended. She takes Miralax daily so she has "normal stools" for her, but she c/o pressure when she passes gas and has a BM. She denies n/v, blood in her stool or a temp. She is most comfortable lying down or standing still; any movement causes the discomfort. Pt thought it was gas, but GAS X did not help. Pt would like to be seen today. Pt given an appt with Mike Gip, PA at 3pm today.

## 2010-07-13 NOTE — Patient Instructions (Addendum)
We sent prescriptions to [facility] for Cipro and  Flagyl. We gave you a printed, signed, prescription for Vicodin 5/500 mg to take to your pharmacy. We scheduled the CT scan for tomorrow 07-14-2010 at 2:30 PM. I have given you 2 bottles of contrast. You need to drink the 1st bottle at 12:30 PM, and the 2nd bottle at 1:30 PM .  Have no food or drink past  10:30AM.  The location is [facility] In the [employer] building on [address].

## 2010-07-13 NOTE — Progress Notes (Signed)
  Subjective:    Patient ID: Pamela Snow, female    DOB: Oct 21, 1940, 70 y.o.   MRN: [id number]  HPI Pamela Snow is a very nice 70 year old female known to Dr. Jarold Motto. She has family history of colon cancer and personal history of colon polyps. She last underwent colonoscopy August of 2001 with finding of mild sigmoid diverticulosis and one sessile polyp which was a tubular adenoma. She also had upper endoscopy at that time with finding of gastritis and fundic gland polyps biopsies negative for H. pylori.  She comes in today with onset of lower abdominal pain on Thursday, 07/09/2010. She says that the pain gradually worsened through the weekend and has been constant. She said she spent much of the weekend in bed. She has not had any documented fever, chills or sweats. She complains of a pressure type of pain which is worse with movement and walking. She feels bloated and distended though she has been having bowel movements. No melanoma or hematochezia. She complains of increased pain with straining for bowel movement,no dysuria urgency or frequency. No nausea or vomiting.   Review of Systems  Constitutional: Negative.   HENT: Negative.   Eyes: Negative.   Respiratory: Negative.   Cardiovascular: Negative.   Gastrointestinal: Positive for abdominal pain, constipation and abdominal distention.  Genitourinary: Negative.   Musculoskeletal: Negative.   Skin: Negative.   Neurological: Negative.   Hematological: Negative.   Psychiatric/Behavioral: Negative.        Objective:   Physical Exam Well-developed white female in no acute distress pleasant alert and oriented x3 HEENT; nontraumatic normocephalic EOMI PERRLA sclera anicteric Neck; Supple no JVD Cardiovascular ;regular rate and rhythm with S1-S2 no murmur rub or gallop Pulmonary; clear bilaterally  Abdomen; soft bowel sounds active she is tender in the left lower quadrant and suprapubic area no guarding she does have rebound no palpable mass  or hepatosplenomegaly  Rectal; not done  Skin; Benign Psych; mood and affect normal an appropriate        Assessment & Plan:  #102 70 year old female with a five-day history of left lower quadrant pain and pressure consistent with diverticulitis. She does have some rebound on physical exam and will therefore obtain CT scan abdomen and pelvis. Check CBC and be met today Start Cipro 500 mg twice daily x14 days Start Flagyl 500 mg twice daily x14 days Vicodin 5/501 every 6-8 hours if needed for pain #20 no refills. She is advised to call should she have any worsening in her abdominal pain. We have scheduled the CT scan for tomorrow 07/14/2010

## 2010-07-14 ENCOUNTER — Ambulatory Visit (INDEPENDENT_AMBULATORY_CARE_PROVIDER_SITE_OTHER)
Admission: RE | Admit: 2010-07-14 | Discharge: 2010-07-14 | Disposition: A | Discharge status: Home / Self Care | Payer: Medicare Other | Source: Ambulatory Visit | Attending: Physician Assistant | Admitting: Physician Assistant

## 2010-07-14 ENCOUNTER — Telehealth: Payer: Self-pay | Admitting: *Deleted

## 2010-07-14 DIAGNOSIS — R1032 Left lower quadrant pain: Secondary | ICD-10-CM

## 2010-07-14 DIAGNOSIS — K5732 Diverticulitis of large intestine without perforation or abscess without bleeding: Secondary | ICD-10-CM

## 2010-07-14 MED ORDER — IOHEXOL 300 MG/ML  SOLN
100.0000 mL | Freq: Once | INTRAMUSCULAR | Status: AC | PRN
  Administered 2010-07-14: 100 mL via INTRAVENOUS

## 2010-07-14 NOTE — Progress Notes (Signed)
Reviewed and agree with management. Draco Malczewski D. Devona Holmes, M.D., FACG  

## 2010-07-14 NOTE — Telephone Encounter (Signed)
Message copied by Jesse Fall on Tue Jul 14, 2010  8:22 AM ------      Message from: [place], [place]      Created: Mon Jul 13, 2010  5:33 PM       Please let pt know her labs are normal;

## 2010-07-14 NOTE — Telephone Encounter (Signed)
Patient notified of lab results as per Amy Esterwood, PA 

## 2010-07-15 ENCOUNTER — Telehealth: Payer: Self-pay | Admitting: *Deleted

## 2010-07-15 NOTE — Telephone Encounter (Signed)
Per Mike Gip, PA, call patient and tell her the CT shows diverticulitis. Need to finish antibiotics. Call if pain continues after finishing antibiotics. Patient notified of results and recommendations.

## 2010-07-22 ENCOUNTER — Inpatient Hospital Stay (HOSPITAL_COMMUNITY): Payer: Medicare Other

## 2010-07-22 ENCOUNTER — Inpatient Hospital Stay (HOSPITAL_COMMUNITY)
Admission: AD | Admit: 2010-07-22 | Discharge: 2010-07-26 | DRG: 392 | Disposition: A | Discharge status: Home / Self Care | Payer: Medicare Other | Source: Ambulatory Visit | Attending: Internal Medicine | Admitting: Internal Medicine

## 2010-07-22 ENCOUNTER — Other Ambulatory Visit (HOSPITAL_COMMUNITY): Payer: Medicare Other

## 2010-07-22 ENCOUNTER — Telehealth: Payer: Self-pay | Admitting: Gastroenterology

## 2010-07-22 ENCOUNTER — Encounter: Payer: Self-pay | Admitting: Nurse Practitioner

## 2010-07-22 ENCOUNTER — Ambulatory Visit (HOSPITAL_COMMUNITY): Payer: Medicare Other | Admitting: Nurse Practitioner

## 2010-07-22 VITALS — BP 144/88 | HR 80 | Ht 68.5 in

## 2010-07-22 DIAGNOSIS — K5732 Diverticulitis of large intestine without perforation or abscess without bleeding: Secondary | ICD-10-CM | POA: Insufficient documentation

## 2010-07-22 LAB — COMPREHENSIVE METABOLIC PANEL
ALT: 22 U/L (ref 0–35)
AST: 20 U/L (ref 0–37)
Albumin: 3.7 g/dL (ref 3.5–5.2)
Alkaline Phosphatase: 27 U/L — ABNORMAL LOW (ref 39–117)
BUN: 17 mg/dL (ref 6–23)
CO2: 28 mEq/L (ref 19–32)
Calcium: 10 mg/dL (ref 8.4–10.5)
Chloride: 103 mEq/L (ref 96–112)
Creatinine, Ser: 0.91 mg/dL (ref 0.4–1.2)
GFR calc Af Amer: >60 mL/min (ref >60)
GFR calc non Af Amer: >60 mL/min (ref >60)
Glucose, Bld: 101 mg/dL — ABNORMAL HIGH (ref 70–99)
Potassium: 3.8 mEq/L (ref 3.5–5.1)
Sodium: 139 mEq/L (ref 135–145)
Total Bilirubin: 0.7 mg/dL (ref 0.3–1.2)
Total Protein: 6.6 g/dL (ref 6.0–8.3)

## 2010-07-22 LAB — CBC
HCT: 38.7 % (ref 36.0–46.0)
Hemoglobin: 13.3 g/dL (ref 12.0–15.0)
MCH: 30.9 pg (ref 26.0–34.0)
MCHC: 34.4 g/dL (ref 30.0–36.0)
MCV: 90 fL (ref 78.0–100.0)
Platelets: 297 10*3/uL (ref 150–400)
RBC: 4.3 MIL/uL (ref 3.87–5.11)
RDW: 12.4 % (ref 11.5–15.5)
WBC: 11.6 10*3/uL — ABNORMAL HIGH (ref 4.0–10.5)

## 2010-07-22 MED ORDER — GADOBENATE DIMEGLUMINE 529 MG/ML IV SOLN
15.0000 mL | Freq: Once | INTRAVENOUS | Status: AC | PRN
  Administered 2010-07-22: 15 mL via INTRAVENOUS

## 2010-07-22 NOTE — Progress Notes (Signed)
07/22/2010 Pamela Snow [id number] Jun 12, 1940   History of Present Illness:  Pamela Snow is a 70 year old female followed by Dr. Jarold Motto for a family history of colon cancer and a personal history of colon polyps. She has a known history of sigmoid diverticulosis. On April 9th  [name] saw [name], PA-C in our office with lower abdominal pain described as a pressure exacerbated with movement and walking. CT scan of the abdomen and pelvis with contrast remarkable for a focal area of wall thickening and pericolonic edema of the sigmoid colon. Incidentally there was gastric wall thickening seen, favored to be under distention. CBC at that time was unremarkable.Basic metabolic profile was also unremarkable. Patient was given a 14 day course of Flagyl and Cipro. She responded nicely to the antibiotics but then over the last couple of days has developed recurrent abdominal pain. Abdominal pain seems to be more diffuse in nature this time, though she has significant left lower quadrant tenderness on examination. No chills. No significant nausea. Patient is scared to eat or drink for fear of causing exacerbation of pain. In the office her vital signs are stable, no signs of dehydration. Her examination is remarkable for fairly significant left lower quadrant tenderness.   Past Medical History  Diagnosis Date  . Hiatal hernia   . Diverticulosis   . Constipation   . GERD (gastroesophageal reflux disease)   . Arthritis    Past Surgical History  Procedure Date  . Knee surgery     bilateral     reports that she has quit smoking. She has never used smokeless tobacco. She reports that she drinks alcohol. She reports that she does not use illicit drugs. family history includes Colon cancer in her mother. Allergies  Allergen Reactions  . Penicillins       Outpatient Encounter Prescriptions as of 07/22/2010  Medication Sig Dispense Refill  . CALCIUM-VITAMIN D PO Take by mouth.        .  ciprofloxacin (CIPRO) 500 MG tablet Take 1 tablet (500 mg total) by mouth 2 (two) times daily.  28 tablet  0  . escitalopram (LEXAPRO) 10 MG tablet Take 10 mg by mouth daily.        Marland Kitchen esomeprazole (NEXIUM) 40 MG capsule Take 40 mg by mouth daily before breakfast.        . levothyroxine (SYNTHROID, LEVOTHROID) 50 MCG tablet Take 50 mcg by mouth daily.        . metroNIDAZOLE (FLAGYL) 500 MG tablet Take 1 tablet (500 mg total) by mouth 2 (two) times daily.  28 tablet  0  . polyethylene glycol powder (GLYCOLAX/MIRALAX) powder Take 17 g by mouth daily. Nightly       . HYDROcodone-acetaminophen (VICODIN) 5-500 MG per tablet Take 1 tablet by mouth at bedtime.  20 tablet  0     ROS: All other systems reviewed and negative except where noted in the History of Present Illness.   Physical Exam: General: Well developed , well nourished, no acute distress Head: Normocephalic and atraumatic Eyes:  sclerae anicteric,conjunctive pink. Ears: Normal auditory acuity Mouth: No deformity or lesions Neck: Supple, no masses.  Lungs: Clear throughout to auscultation Heart: Regular rate and rhythm; no murmurs heard Abdomen: Soft, non tender and non distended. No masses or hepatomegaly noted. Normal Bowel sounds Rectal: Not done Musculoskeletal: Symmetrical with no gross deformities  Skin: No lesions on visible extremities Extremities: No edema or deformities noted Neurological: Alert oriented x 4, grossly nonfocal Cervical Nodes:  No significant cervical adenopathy Psychological:  Alert and cooperative. Normal mood and affect  Assessment and Plan:

## 2010-07-22 NOTE — Patient Instructions (Signed)
We have called admitting at [facility] and they have a room for you in the 3 [facility] floor. When you leave here go directly to admitting across the street at [facility]. Our hospital  MD for the week is Dr. Stan Head. Please give the envelope we have given you to the admitting department representative.

## 2010-07-22 NOTE — Telephone Encounter (Signed)
Spoke to pt who stated Dr Jarold Motto has already called her and Willette Cluster, NP will work her in today.

## 2010-07-22 NOTE — Assessment & Plan Note (Signed)
First episode of diverticulitis seen on CT scan 07/10/2010. Patient has been on Cipro and Flagyl and was showing considerable improvement until the last day or so when she had recurrent abdominal pain. We discussed continuing outpatient management versus inpatient treatment. Given the patient's relapse while on antibiotics, probably prudent to admit her for further evaluation and treatment. She will be admitted to [facility] for MRI of the abdomen, IV antibiotics, IV fluids, analgesics and DVT prophylaxis.

## 2010-07-23 ENCOUNTER — Inpatient Hospital Stay (HOSPITAL_COMMUNITY): Payer: Medicare Other

## 2010-07-23 DIAGNOSIS — R1032 Left lower quadrant pain: Secondary | ICD-10-CM

## 2010-07-23 DIAGNOSIS — K5732 Diverticulitis of large intestine without perforation or abscess without bleeding: Secondary | ICD-10-CM

## 2010-07-23 MED ORDER — IOHEXOL 300 MG/ML  SOLN
100.0000 mL | Freq: Once | INTRAMUSCULAR | Status: AC | PRN
  Administered 2010-07-23: 100 mL via INTRAVENOUS

## 2010-07-24 LAB — DIFFERENTIAL
Basophils Absolute: 0 10*3/uL (ref 0.0–0.1)
Basophils Relative: 0 % (ref 0–1)
Eosinophils Absolute: 0.1 10*3/uL (ref 0.0–0.7)
Eosinophils Relative: 2 % (ref 0–5)
Lymphocytes Relative: 24 % (ref 12–46)
Lymphs Abs: 1.4 10*3/uL (ref 0.7–4.0)
Monocytes Absolute: 0.6 10*3/uL (ref 0.1–1.0)
Monocytes Relative: 10 % (ref 3–12)
Neutro Abs: 3.7 10*3/uL (ref 1.7–7.7)
Neutrophils Relative %: 64 % (ref 43–77)

## 2010-07-24 LAB — CBC
HCT: 35.8 % — ABNORMAL LOW (ref 36.0–46.0)
Hemoglobin: 12 g/dL (ref 12.0–15.0)
MCH: 30.2 pg (ref 26.0–34.0)
MCHC: 33.5 g/dL (ref 30.0–36.0)
MCV: 90.2 fL (ref 78.0–100.0)
Platelets: 264 10*3/uL (ref 150–400)
RBC: 3.97 MIL/uL (ref 3.87–5.11)
RDW: 12.3 % (ref 11.5–15.5)
WBC: 5.8 10*3/uL (ref 4.0–10.5)

## 2010-07-24 LAB — C-REACTIVE PROTEIN: CRP: 4.2 mg/dL — ABNORMAL HIGH (ref <0.6)

## 2010-07-25 ENCOUNTER — Inpatient Hospital Stay (HOSPITAL_COMMUNITY): Payer: Medicare Other

## 2010-07-26 DIAGNOSIS — K5732 Diverticulitis of large intestine without perforation or abscess without bleeding: Secondary | ICD-10-CM

## 2010-07-26 DIAGNOSIS — R1032 Left lower quadrant pain: Secondary | ICD-10-CM

## 2010-07-26 LAB — CLOSTRIDIUM DIFFICILE BY PCR: Toxigenic C. Difficile by PCR: NEGATIVE

## 2010-07-27 ENCOUNTER — Encounter: Payer: Self-pay | Admitting: *Deleted

## 2010-07-27 ENCOUNTER — Telehealth: Payer: Self-pay | Admitting: *Deleted

## 2010-07-27 NOTE — Telephone Encounter (Signed)
Per Mike Gip, PA, lmom and mailed pt a letter notifying her of f/u appt with Dr Jarold Motto for 08/13/10 at 11am.

## 2010-08-11 ENCOUNTER — Other Ambulatory Visit: Payer: Medicare Other

## 2010-08-11 ENCOUNTER — Telehealth: Payer: Self-pay | Admitting: *Deleted

## 2010-08-11 DIAGNOSIS — R197 Diarrhea, unspecified: Secondary | ICD-10-CM

## 2010-08-11 DIAGNOSIS — R509 Fever, unspecified: Secondary | ICD-10-CM

## 2010-08-11 MED ORDER — TRAMADOL HCL 50 MG PO TABS: ORAL_TABLET | ORAL | Status: DC

## 2010-08-11 NOTE — Telephone Encounter (Signed)
Resent tramadol script.

## 2010-08-11 NOTE — Telephone Encounter (Signed)
Pt called to report she completed the Cipro and Flagyl last Wednesday and was feeling fine. This am she woke up with temp of 100, diarrhea and abdominal pain. Pt has an appt 08/13/10 at 1100am.  Notified pt per Dr Jarold Motto, bring in a stool sample for C Diff by PCR and we will order Ultram for her pain. Pt reports she hasn't eaten today and I recommended she try the BRAT diet and avoid dairy products. Keep her appt on 08/13/10. Pt will call for worsening problems.

## 2010-08-12 LAB — CLOSTRIDIUM DIFFICILE EIA: CDIFTX: NEGATIVE

## 2010-08-12 MED ORDER — TRAMADOL HCL 50 MG PO TABS: ORAL_TABLET | ORAL | Status: DC

## 2010-08-12 NOTE — Telephone Encounter (Signed)
Addended by: Graciella Freer on: 08/12/2010 02:17 PM   Modules accepted: Orders

## 2010-08-13 ENCOUNTER — Ambulatory Visit (INDEPENDENT_AMBULATORY_CARE_PROVIDER_SITE_OTHER): Payer: Medicare Other | Admitting: Gastroenterology

## 2010-08-13 ENCOUNTER — Encounter: Payer: Self-pay | Admitting: Gastroenterology

## 2010-08-13 ENCOUNTER — Other Ambulatory Visit (INDEPENDENT_AMBULATORY_CARE_PROVIDER_SITE_OTHER): Payer: Medicare Other

## 2010-08-13 VITALS — BP 156/88 | HR 104 | Temp 98.8°F

## 2010-08-13 DIAGNOSIS — T887XXA Unspecified adverse effect of drug or medicament, initial encounter: Secondary | ICD-10-CM

## 2010-08-13 DIAGNOSIS — T50905A Adverse effect of unspecified drugs, medicaments and biological substances, initial encounter: Secondary | ICD-10-CM

## 2010-08-13 LAB — CBC WITH DIFFERENTIAL/PLATELET
Basophils Absolute: 0 10*3/uL (ref 0.0–0.1)
Basophils Relative: 0.2 % (ref 0.0–3.0)
Eosinophils Absolute: 0 10*3/uL (ref 0.0–0.7)
Eosinophils Relative: 0 % (ref 0.0–5.0)
HCT: 36.6 % (ref 36.0–46.0)
Hemoglobin: 12.7 g/dL (ref 12.0–15.0)
Lymphocytes Relative: 7.7 % — ABNORMAL LOW (ref 12.0–46.0)
Lymphs Abs: 0.7 10*3/uL (ref 0.7–4.0)
MCHC: 34.8 g/dL (ref 30.0–36.0)
MCV: 91.6 fl (ref 78.0–100.0)
Monocytes Absolute: 0.8 10*3/uL (ref 0.1–1.0)
Monocytes Relative: 8.3 % (ref 3.0–12.0)
Neutro Abs: 7.7 10*3/uL (ref 1.4–7.7)
Neutrophils Relative %: 83.8 % — ABNORMAL HIGH (ref 43.0–77.0)
Platelets: 264 10*3/uL (ref 150.0–400.0)
RBC: 4 Mil/uL (ref 3.87–5.11)
RDW: 12.6 % (ref 11.5–14.6)
WBC: 9.1 10*3/uL (ref 4.5–10.5)

## 2010-08-13 LAB — BASIC METABOLIC PANEL
BUN: 16 mg/dL (ref 6–23)
CO2: 25 mEq/L (ref 19–32)
Calcium: 9.7 mg/dL (ref 8.4–10.5)
Chloride: 100 mEq/L (ref 96–112)
Creatinine, Ser: 0.9 mg/dL (ref 0.4–1.2)
GFR: 67.9 mL/min (ref >60.00)
Glucose, Bld: 92 mg/dL (ref 70–99)
Potassium: 3.7 mEq/L (ref 3.5–5.1)
Sodium: 135 mEq/L (ref 135–145)

## 2010-08-13 LAB — SEDIMENTATION RATE: Sed Rate: 29 mm/hr — ABNORMAL HIGH (ref 0–22)

## 2010-08-13 LAB — HIGH SENSITIVITY CRP: CRP, High Sensitivity: 66.75 mg/L — ABNORMAL HIGH (ref 0.00–5.00)

## 2010-08-13 MED ORDER — TAPENTADOL HCL 100 MG PO TABS: 1.0000 | ORAL_TABLET | Freq: Two times a day (BID) | ORAL | Status: DC

## 2010-08-13 MED ORDER — METHYLPREDNISOLONE ACETATE 80 MG/ML IJ SUSP
80.0000 mg | Freq: Once | INTRAMUSCULAR | Status: AC
  Administered 2010-08-13: 80 mg via INTRAMUSCULAR

## 2010-08-13 MED ORDER — ALPRAZOLAM 0.5 MG PO TABS: 0.5000 mg | ORAL_TABLET | Freq: Three times a day (TID) | ORAL | Status: DC | PRN

## 2010-08-13 NOTE — Patient Instructions (Signed)
Today in the office you were given depo.medrol 80mg . We printed and gave you two rx one for Xanax & Nucynta. Please go to the basement today for your labs.

## 2010-08-13 NOTE — Progress Notes (Signed)
This is a 70 year old Caucasian female who has recently been hospitalized because of severe diverticulitis with left lower quadrant pain. She was on multiple antibiotics, and had been doing well until yesterday when she phoned in with a relapse of her left lower quadrant pain without diarrhea or systemic complaints or upper gastrointestinal or hepatobiliary problems. He was prescribed when necessary tramadol 50 mg every 6-8 hours. She now presents with a serum sickness-like appearance with multiple arthralgias, facial edema, general malaise, low-grade fever. Denies current gastrointestinal issues. She takes regular MiraLax for severe chronic constipation, Nexium for GERD, Lexapro 10 mg a day, and calcium with multivitamins. Her primary care physician is Dr. Creola Corn.  Current Medications, Allergies, Past Medical History, Past Surgical History, Family History and Social History were reviewed in [employer] record.  Pertinent Review of Systems Negative   Physical exam: Somewhat ill-appearing white female in no acute distress. There is some facial puffiness and swelling, no systemic skin rash or other swollen joints. She does not appear toxic. There is no lymphadenopathy, chest is clear to P&A, cardiac exam is unremarkable with a normal rhythm, abdominal exam is unremarkable. There are very high-pitched bowel sounds noted. Mental status is clear but she is in somewhat emotional distress. Temperature of this is 37.1C     Assessment and Plan: Probable drug reaction to tramadol with serum sickness-like picture. She refuses steroid Dosepak, and therefore have given her Depo-Medrol 80 mg IM, will stop tramadol and use when necessary Nucynta 100 mg every 12 hours as needed for pain control, stop hydrocodone with acetaminophen because of possible acetaminophen toxicity, prescribe Xanax 0.5 mg every 6-8 hours as needed, continue MiraLax for constipation, and check CBC, differential, CRP,  and metabolic profile. She will need followup colonoscopy in next 1-2 months because of her strong family history of colon cancer. Her clinical picture however has been most consistent with diverticulitis.  Please copy her primary care physician, referring physician, and pertinent subspecialists.   Encounter Diagnosis  Name Primary?  . Drug reaction Yes

## 2010-08-14 ENCOUNTER — Inpatient Hospital Stay (HOSPITAL_COMMUNITY)
Admission: EM | Admit: 2010-08-14 | Discharge: 2010-08-16 | DRG: 916 | Disposition: A | Discharge status: Home / Self Care | Payer: Medicare Other | Source: Ambulatory Visit | Attending: Internal Medicine | Admitting: Internal Medicine

## 2010-08-14 ENCOUNTER — Inpatient Hospital Stay (HOSPITAL_COMMUNITY): Payer: Medicare Other

## 2010-08-14 ENCOUNTER — Encounter: Payer: Self-pay | Admitting: Gastroenterology

## 2010-08-14 DIAGNOSIS — M2550 Pain in unspecified joint: Secondary | ICD-10-CM | POA: Diagnosis present

## 2010-08-14 DIAGNOSIS — Q638 Other specified congenital malformations of kidney: Secondary | ICD-10-CM

## 2010-08-14 DIAGNOSIS — D649 Anemia, unspecified: Secondary | ICD-10-CM | POA: Diagnosis present

## 2010-08-14 DIAGNOSIS — T8069XA Other serum reaction due to other serum, initial encounter: Principal | ICD-10-CM | POA: Diagnosis present

## 2010-08-14 DIAGNOSIS — M19039 Primary osteoarthritis, unspecified wrist: Secondary | ICD-10-CM | POA: Diagnosis present

## 2010-08-14 DIAGNOSIS — Z880 Allergy status to penicillin: Secondary | ICD-10-CM

## 2010-08-14 DIAGNOSIS — K219 Gastro-esophageal reflux disease without esophagitis: Secondary | ICD-10-CM | POA: Diagnosis present

## 2010-08-14 DIAGNOSIS — R5381 Other malaise: Secondary | ICD-10-CM | POA: Diagnosis present

## 2010-08-14 DIAGNOSIS — E860 Dehydration: Secondary | ICD-10-CM | POA: Diagnosis present

## 2010-08-14 DIAGNOSIS — E538 Deficiency of other specified B group vitamins: Secondary | ICD-10-CM | POA: Diagnosis present

## 2010-08-14 DIAGNOSIS — Z96659 Presence of unspecified artificial knee joint: Secondary | ICD-10-CM

## 2010-08-14 DIAGNOSIS — M1710 Unilateral primary osteoarthritis, unspecified knee: Secondary | ICD-10-CM | POA: Diagnosis present

## 2010-08-14 DIAGNOSIS — T3695XA Adverse effect of unspecified systemic antibiotic, initial encounter: Secondary | ICD-10-CM | POA: Diagnosis present

## 2010-08-14 DIAGNOSIS — B370 Candidal stomatitis: Secondary | ICD-10-CM | POA: Diagnosis present

## 2010-08-14 DIAGNOSIS — Z79899 Other long term (current) drug therapy: Secondary | ICD-10-CM

## 2010-08-14 DIAGNOSIS — G4700 Insomnia, unspecified: Secondary | ICD-10-CM | POA: Diagnosis present

## 2010-08-14 DIAGNOSIS — K222 Esophageal obstruction: Secondary | ICD-10-CM | POA: Diagnosis present

## 2010-08-14 DIAGNOSIS — F341 Dysthymic disorder: Secondary | ICD-10-CM | POA: Diagnosis present

## 2010-08-14 DIAGNOSIS — K5730 Diverticulosis of large intestine without perforation or abscess without bleeding: Secondary | ICD-10-CM | POA: Diagnosis present

## 2010-08-14 DIAGNOSIS — E039 Hypothyroidism, unspecified: Secondary | ICD-10-CM | POA: Diagnosis present

## 2010-08-14 DIAGNOSIS — R627 Adult failure to thrive: Secondary | ICD-10-CM | POA: Diagnosis present

## 2010-08-14 DIAGNOSIS — K5900 Constipation, unspecified: Secondary | ICD-10-CM | POA: Diagnosis present

## 2010-08-14 DIAGNOSIS — R609 Edema, unspecified: Secondary | ICD-10-CM | POA: Diagnosis present

## 2010-08-14 LAB — CBC
HCT: 36.6 % (ref 36.0–46.0)
Hemoglobin: 12.3 g/dL (ref 12.0–15.0)
MCH: 30.1 pg (ref 26.0–34.0)
MCHC: 33.6 g/dL (ref 30.0–36.0)
MCV: 89.7 fL (ref 78.0–100.0)
Platelets: 254 10*3/uL (ref 150–400)
RBC: 4.08 MIL/uL (ref 3.87–5.11)
RDW: 12.2 % (ref 11.5–15.5)
WBC: 8.7 10*3/uL (ref 4.0–10.5)

## 2010-08-14 LAB — COMPREHENSIVE METABOLIC PANEL
ALT: 17 U/L (ref 0–35)
AST: 18 U/L (ref 0–37)
Albumin: 3.4 g/dL — ABNORMAL LOW (ref 3.5–5.2)
Alkaline Phosphatase: 29 U/L — ABNORMAL LOW (ref 39–117)
BUN: 20 mg/dL (ref 6–23)
CO2: 26 mEq/L (ref 19–32)
Calcium: 9.9 mg/dL (ref 8.4–10.5)
Chloride: 94 mEq/L — ABNORMAL LOW (ref 96–112)
Creatinine, Ser: 0.85 mg/dL (ref 0.4–1.2)
GFR calc Af Amer: >60 mL/min (ref >60)
GFR calc non Af Amer: >60 mL/min (ref >60)
Glucose, Bld: 85 mg/dL (ref 70–99)
Potassium: 4.4 mEq/L (ref 3.5–5.1)
Sodium: 128 mEq/L — ABNORMAL LOW (ref 135–145)
Total Bilirubin: 0.6 mg/dL (ref 0.3–1.2)
Total Protein: 6.6 g/dL (ref 6.0–8.3)

## 2010-08-14 LAB — URINE MICROSCOPIC-ADD ON

## 2010-08-14 LAB — CK: Total CK: 31 U/L (ref 7–177)

## 2010-08-14 LAB — URINALYSIS, ROUTINE W REFLEX MICROSCOPIC
Glucose, UA: NEGATIVE mg/dL
Hgb urine dipstick: NEGATIVE
Ketones, ur: 40 mg/dL — AB
Leukocytes, UA: NEGATIVE
Nitrite: NEGATIVE
Protein, ur: 30 mg/dL — AB
Specific Gravity, Urine: 1.025 (ref 1.005–1.030)
Urobilinogen, UA: 0.2 mg/dL (ref 0.0–1.0)
pH: 5.5 (ref 5.0–8.0)

## 2010-08-14 LAB — TSH: TSH: 0.57 u[IU]/mL (ref 0.350–4.500)

## 2010-08-14 LAB — SEDIMENTATION RATE: Sed Rate: 54 mm/hr — ABNORMAL HIGH (ref 0–22)

## 2010-08-14 NOTE — Progress Notes (Signed)
I spoke with the patient today and she is still having arthralgias and general malaise. Her labs revealed an elevated CRP and sed rate. She denies abdominal pain or rectal bleeding. I have called her primary care physician Dr. Creola Corn who will see the patient today for evaluation.

## 2010-08-15 LAB — C-REACTIVE PROTEIN: CRP: 26.3 mg/dL — ABNORMAL HIGH (ref <0.6)

## 2010-08-15 LAB — RHEUMATOID FACTOR: Rhuematoid fact SerPl-aCnc: 17 IU/mL — ABNORMAL HIGH (ref <=14)

## 2010-08-17 LAB — ANTI-NUCLEAR AB-TITER (ANA TITER): ANA Titer 1: 1:40 {titer} — ABNORMAL HIGH

## 2010-08-17 LAB — ANA: Anti Nuclear Antibody(ANA): POSITIVE — AB

## 2010-08-17 LAB — CYCLIC CITRUL PEPTIDE ANTIBODY, IGG
Cyclic Citrullin Peptide Ab: <2 U/mL (ref 0.0–5.0)
Cyclic Citrullin Peptide Ab: <2 U/mL (ref 0.0–5.0)

## 2010-08-18 NOTE — Discharge Summary (Signed)
NAME:  Pamela Snow, Pamela Snow             ACCOUNT NO.:  [id number]  MEDICAL RECORD NO.:  [id number]           PATIENT TYPE:  I  LOCATION:  1334                         FACILITY:  [facility]  PHYSICIAN:  Barry Dienes. Eloise Harman, M.D.DATE OF BIRTH:  1940-07-20  DATE OF ADMISSION:  08/14/2010 DATE OF DISCHARGE:  08/16/2010                              DISCHARGE SUMMARY   PERTINENT FINDINGS:  The patient is a 70 year old Caucasian woman who is otherwise very healthy and was seen in the office for significant arthralgias and facial edema.  On July 14, 2010, she had abdominal pain and a CAT scan showed sigmoid diverticulitis, so she was treated with oral antibiotics.  She did not tolerate the oral antibiotics well and needed IV antibiotics and an inpatient admission for 5 days.  She was discharged home on July 26, 2010.  She was seen by her GI physician as an outpatient with symptoms of significant facial edema, arthralgias, malaise, and a concern about possible serum sickness.  He checked her CRP test which was extremely high, gave her one dose of Depo-Medrol and sent her home.  She was seen at our office by Dr. Creola Corn for a followup.  He noted that she had significant wrist edema and other arthralgias as well as facial edema.  She also complained of a temperature of 100 on the day prior to admission.  She was dehydrated with a thick swollen tongue and possible thrush, so she was admitted for further evaluation.  PAST MEDICAL HISTORY:  Mood disorder with depression, on treatment; peripheral edema; GERD with stricture; hypothyroidism; insomnia; diverticulosis, April 2012 episode of diverticulitis in the sigmoid colon; history of severe sinusitis with headaches; degenerative disk disease; bilateral knee osteoarthritis; history of total abdominal hysterectomy with bilateral salpingo-oophorectomy in 2002; history of remote tonsillectomy; history of basal cell carcinoma of the nose; history of right  total knee replacement; history of colonoscopy in January 2008 that only showed diverticulosis.  See admission history and physical for details of family history, social history, and review of systems.  ALLERGIES:  PENICILLIN and CLINDAMYCIN.  MEDICATIONS:  Medications prior to hospitalization:  Alprazolam 0.25 mg p.o. t.i.d. p.r.n. anxiety, calcium 600 mg by mouth twice daily, Lexapro 10 mg daily, multivitamin 1 tablet daily, Nexium 40 mg daily, Synthroid 50 mcg daily, Nascobal 500 mcg 0.1 mL nasal solution take 8 sprays once weekly, Lunesta 3 mg p.o. q.h.s. p.r.n. sleep, Nucynta 100 mg p.o. b.i.d., Vicodin 5/500 one tablet p.o. daily p.r.n. per Dr. Sheryn Bison.  INITIAL PHYSICAL EXAM:  VITAL SIGNS:  Blood pressure 110/70, pulse 100, respirations 20, temperature 98. GENERAL:  In general, she had facial edema and looked fatigued. HEENT:  Head, eyes, ears, nose and throat exam was otherwise notable for dry tongue with white coat on the tongue that appeared to be thrush. NECK:  Supple without jugular venous distention or carotid bruit. CHEST:  Clear to auscultation. HEART:  Regular rate and rhythm without significant murmur. ABDOMEN:  Normal bowel sounds and no tenderness. MUSCULOSKELETAL EXAM:  Showed edema of the wrists and knees. NEUROLOGIC EXAM:  There were no focal neurologic deficits.  HOSPITAL COURSE:  This patient was admitted to a medical bed without telemetry.  She was treated with IV antibiotics as well as initial IV high-dose corticosteroids followed by moderate dose prednisone.  She also had serial labs checked.  Her arthralgias, facial edema, and malaise rapidly improved.  PROCEDURES:  None.  COMPLICATIONS:  None.  CONDITION ON DISCHARGE:  She felt a bit "weird" from moderate dose prednisone treatment but was otherwise spine having only very mild discomfort in both wrists.  She had been able to eat and drink well and only had mild insomnia.  She denied any  shortness of breath or abdominal pain.  CURRENT PHYSICAL EXAMINATION:  VITAL SIGNS:  Blood pressure 114/73, pulse 61, respirations 18, temperature 98.4, pulse oxygen saturation was 99% on room air. GENERAL:  In general, she is a well-nourished, well-developed white woman who is in no apparent distress while lying supine in bed. HEENT:  Head, eyes, ears, nose and throat exam was within normal limits. NECK:  Supple without jugular venous distention. CHEST:  Clear to auscultation. HEART:  Regular rate and rhythm without significant murmur or gallop. ABDOMEN:  Normal bowel sounds and no tenderness. EXTREMITIES:  Without cyanosis, clubbing, or edema.  There signs of osteoarthritis in multiple fingers of both hands.  There was no evidence of synovitis.  PERTINENT LABORATORY STUDIES:  Serum sodium 128, potassium 4.4, chloride 94, carbon dioxide 26, BUN 20, creatinine 0.85, glucose 85.  Liver associated enzymes normal.  White blood cells 8.7, hemoglobin 12, hematocrit 36.6, platelets 254.  Abdomen x-rays were normal.  TSH was 0.57.  Urinalysis was normal.  Erythrocyte sedimentation rate was 54 (0- 22).  C-reactive protein was 26.3 (greater than 0.6) with the level the day prior 66.7.  Rheumatoid factor was 17 (0-20), and blood cultures were negative today.  DISCHARGE DIAGNOSES: 1. Acute serum sickness, markedly improved. 2. Anemia, mild. 3. Thrush. 4. Osteoarthritis of the hands and wrists. 5. Depression. 6. Insomnia. 7. Constipation. 8. Gastroesophageal reflux disease with esophageal stricture. 9. Anxiety. 10.Hypothyroidism. 11.Vitamin B12 deficiency.  DISCHARGE MEDICATIONS: 1. Flora-Q probiotic one cap p.o. daily for the next 14 days. 2. Magic mouthwash 10 mL p.o. 4 times daily. 3. Tramadol 50 mg p.o. t.i.d. p.r.n. moderate pain. 4. Prednisone time 10 mg tablets take 2 tablets by mouth daily for 4     days, then 1 tablet daily for 4 days. 5. Lexapro 10 mg p.o. q.h.s. and she  was advised that if diarrhea     recurs then she should reduce her dose by half tablet daily. 6. Lunesta 3 mg p.o. q.h.s. p.r.n. sleep. 7. MiraLax 17 g in 8 ounces of fluid p.o. daily p.r.n. constipation. 8. Nexium 40 mg p.o. daily. 9. Os-Cal D 500 one tablet p.o. daily. 10.Synthroid 50 mcg p.o. daily. 11.Xanax 0.5 mg p.o. t.i.d. p.r.n. anxiety. 12.Nascobal 500 mcg per 0.1 mL nasal solution take 8 sprays     intranasally once weekly.  DISPOSITION AND FOLLOWUP:  The patient will be discharged to home and should schedule a followup appointment with Dr. Creola Corn at [facility] within approximately 2 weeks.  She was advised to call [phone] to schedule that appointment.  She should call earlier should her symptoms recur.          ______________________________ Barry Dienes. Eloise Harman, M.D.     DGP/MEDQ  D:  08/16/2010  T:  08/16/2010  Job:  [id number]  cc:   [name], MD Fax: [phone]  Vania Rea. Jarold Motto, MD, Clementeen Graham, FACP, FAGA [phone]  [address] [place] [address]  Electronically Signed by Jarome Matin M.D. on 08/18/2010 10:46:54 AM

## 2010-08-18 NOTE — Op Note (Signed)
NAME:  Pamela Snow, Pamela Snow             ACCOUNT NO.:  [id number]   MEDICAL RECORD NO.:  [id number]          PATIENT TYPE:  INP   LOCATION:  0001                         FACILITY:  [facility]   PHYSICIAN:  Madlyn Frankel. Charlann Boxer, M.D.  DATE OF BIRTH:  07-21-1940   DATE OF PROCEDURE:  04/04/2007  DATE OF DISCHARGE:                               OPERATIVE REPORT   PREOPERATIVE DIAGNOSIS:  Left knee osteoarthritis.   POSTOPERATIVE DIAGNOSIS:  Left knee osteoarthritis.   PROCEDURE:  Left total knee replacement.   COMPONENTS USED:  DePuy rotating platform posterior stabilized knee  system, size 2.5 femur, 2.5 tibial, 10 mm insert, and a 38 patellar  button.   SURGEON:  Madlyn Frankel. Charlann Boxer, M.D.   ASSISTANT:  Yetta Glassman. Marcellus Scott.   ANESTHESIA:  Duramorph spinal.   COMPLICATIONS:  None.   DRAINS:  Times 1.   INDICATIONS FOR PROCEDURE:  Ms. Vorhees is a very pleasant 70 year old  patient of mine who has had bilateral knee osteoarthritis, predominantly  patellofemoral.  She is status post right total knee replacement with an  excellent clinical result.  She presented with persistent recurring left  knee pain and the plan is to have this left knee done.  We reviewed the  risks and benefits including the postoperative complication that she had  the last time with some GI constipation.  Consent was obtained.   PROCEDURE IN DETAIL:  The patient was brought to the operative theater.  Once adequate anesthesia and preoperative antibiotics of Ancef were  administered, the patient was positioned supine.  Left lower extremity  was scrubbed and prepped and draped in sterile fashion.   The leg was exsanguinated.  Tourniquet elevated.  Midline incision was  made.  Medial parapatellar arthrotomies were made.  There was  subluxation of patella without any eversion.  Following initial  debridement, attention was directed to this very arthritic patella,  predominantly over the lateral facet.  I resected the bone  down  to 13-  14 mm symmetrically.  I then fitted it with a 38 patellar button  consistent with the contralateral knee.  A metal shim was placed to  protect this cut surface of the patella from retractors.  Attention was  now directed to the femur.  Femoral exposure was obtained and the canal  drilled.  I then irrigated the canal to prevent fat emboli and then  using the intramedullary device, cut 10 mm of bone off the distal femur  in 5 degrees of valgus.  A sizing guide was used and determined that a  size 2.5 femur worked best and anterior and posterior chamfer cuts were  made based off the posterior condylar axis.  This matched the  perpendicular [employer].  These cuts were made without  complication.   At this point, the final box cut was made based off the lateral aspect  of distal femur.  At this point, attention was directed to the tibia.  The tibia retractor was placed.  Remaining meniscus and cruciate stump  was debrided.  I resected 10 mm of bone off the proximal lateral tibia.  I  checked this cut and the cut surface was a 2.5 and I checked the  alignment rod and was happy that the cut was perpendicular to both the  sagittal and coronal planes.  I checked the extension block and was  happy that the knee came out to full extension.  At this point, I held  the tibial tray in place, drilled, keel-punched it, and trial reduction  was carried out with a 2.5 femur, 2.5 tibia, and 10 mm insert.  The knee  came out to full extension.  The patella tracked without application of  pressure.  At this point all trial components were removed.  The knee  was irrigated with normal saline solution and pulse lavaged.  The  synovial capsular layer was injected with 60 mL of 0.25% Marcaine with  epinephrine and 1 mL of  Toradol.   The final components were cemented into position with a 10 mm insert  placed and the knee brought to extension.  Extruded cement was removed.  Once cement had  cured, excessive cement was debrided throughout the  knee.  Tourniquet was let down at 45 minutes.  Five mL of Floseal was  then injected into the posterior capsule.  The final insert was placed.  The knee was then brought to flexion.  Medium Hemovac was placed and the  knee was reapproximated.  Extensor mechanism was reapproximated using #1  Vicryl.  The remainder of the wound was closed in layers with 2-0 Vicryl  and running 4-0 Monocryl.  The patient's knee was cleaned, dried and  dressed with sterile Steri-Strips and a bulky sterile wrap.  She was  brought to Recovery stable.      Madlyn Frankel Charlann Boxer, M.D.  Electronically Signed     MDO/MEDQ  D:  04/04/2007  T:  04/04/2007  Job:  [id number]

## 2010-08-18 NOTE — H&P (Signed)
NAME:  Pamela Snow, Pamela Snow             ACCOUNT NO.:  [id number]   MEDICAL RECORD NO.:  [id number]           PATIENT TYPE:   LOCATION:                                 FACILITY:   PHYSICIAN:  Madlyn Frankel. Charlann Boxer, M.D.  DATE OF BIRTH:  September 05, 1940   DATE OF ADMISSION:  04/04/2007  DATE OF DISCHARGE:                              HISTORY & PHYSICAL   PROCEDURE:  Left total knee arthroplasty.   CHIEF COMPLAINT:  Left knee pain.   HISTORY OF PRESENT ILLNESS:  70 year old female with a history of left  knee pain secondary to osteoarthritis.  It has been refractory to all  conservative treatments.  She does have a history of right total knee  replacement which she has done very well with.  Of note, after the first  right total knee replacement, she did have a postoperative GI impaction.  Has been very careful and diligent about the use of laxatives to  maintain normal bowel function.  We will be sure to provide MiraLax at  time of surgery as well.  We have consulted with Dr. Jarold Motto and have  had her preop assessed for this left total knee.   PAST MEDICAL HISTORY:  1. Osteoarthritis.  2. Reflux disease.  3. Degenerative disk disease.  4. Postmenopausal.   PAST SURGICAL HISTORY:  1. Left total knee replacement 2008.  2. Hysterectomy 2002.   FAMILY HISTORY:  Cancer.   SOCIAL HISTORY:  Married.  Primary caregiver after surgery will be  husband.   DRUG ALLERGIES:  No known drug allergies.   MEDICATIONS:  1. Vitamin D 5000 units once a week.  2. Nexium 40 mg 1 p.o. daily.  3. Synthroid 0.05 mg once daily.  4. MiraLax 17 g 1 p.o. b.i.d. with 80 ounces of water daily.  5. Robaxin 500 mg p.o. q.6 p.r.n.   REVIEW OF SYSTEMS:  GI:  She did have a postop impaction after her last  surgery.  GENITOURINARY:  History of yeast infections with antibiotic  use.  Otherwise, see HPI.   PHYSICAL EXAMINATION:  Pulse 72, respirations 18, blood pressure 128/92.  GENERAL:  Awake, alert and oriented;  well-developed, well-nourished; no  acute distress.  NECK:  Supple.  No carotid bruits.  CHEST/LUNGS:  Clear to auscultation bilaterally.  BREASTS:  Deferred.  HEART:  Regular rate and rhythm without murmurs.  ABDOMEN:  Soft, nontender, nondistended.  Bowel sounds present.  GENITOURINARY:  Deferred.  EXTREMITIES:  Dorsalis pedis pulse positive left lower extremity.  She  lacks 2 degrees to 3 degrees  of full extension with tenderness around  the patella.  SKIN:  No cellulitis.  NEUROLOGIC:  Intact distal sensibilities.   LABORATORY DATA:  EKG, chest x-ray all pending presurgical testing.   IMPRESSION:  1. Osteoarthritis.  2. Reflux disease.  3. Degenerative disk disease.   PLAN OF ACTION:  A left total knee arthroplasty on April 04, 2007,  [facility] by surgeon Dr. Durene Romans.  Risks and  complications were discussed.   POSTOPERATIVE GI PLAN:  MiraLax 17 g p.o. b.i.d., in addition to Colace  as well  as 80 ounces of water on a daily basis.   POSTOPERATIVE MEDICATIONS:  Were given at time of history and physical,  which included Lovenox Robaxin, iron, aspirin, Colace, MiraLax.     ______________________________  Yetta Glassman. [address]oreta Ave, [place]      Madlyn Frankel. Charlann Boxer, M.D.  Electronically Signed    BLM/MEDQ  D:  03/27/2007  T:  03/28/2007  Job:  [id number]   cc:   Vania Rea. Jarold Motto, MD, FACG, FACP, FAGA  [address]  [place]  [place] [address]   [name], MD  Fax: [phone]

## 2010-08-18 NOTE — Op Note (Signed)
Pamela Snow, Pamela Snow             ACCOUNT NO.:  [id number]   MEDICAL RECORD NO.:  [id number]          PATIENT TYPE:  INP   LOCATION:  0009                         FACILITY:  [facility]   PHYSICIAN:  Madlyn Frankel. Charlann Boxer, M.D.  DATE OF BIRTH:  01-14-1941   DATE OF PROCEDURE:  08/09/2006  DATE OF DISCHARGE:                               OPERATIVE REPORT   PREOPERATIVE DIAGNOSIS:  Right knee osteoarthritis with severe  patellofemoral changes.   POSTOPERATIVE DIAGNOSIS:  Right knee osteoarthritis with severe  patellofemoral changes.   PROCEDURE:  Right total knee replacement.   COMPONENTS USED:  DePuy rotating platform posterior stabilized knee  system with a size 2.5 femur, 2.5 tibia, 10 mm insert, and 38 patellar  button.   SURGEON:  Madlyn Frankel. Charlann Boxer, M.D.   ASSISTANT:  Yetta Glassman. Mann, P.A.-C.   ANESTHESIA:  Duramorph spinal.   BLOOD LOSS:  Less than 50 mL.   TOURNIQUET TIME:  60 minutes at 250 mmHg.   DRAINS:  None.   COMPLICATIONS:  None.   INDICATIONS FOR PROCEDURE:  Pamela Snow is a 70 year old female who  presented to the office for evaluation of knee pain.  She had steadily  and progressively developed increasing discomfort and intolerance to her  level of pain.  She tolerated severe degenerative changes for a long  time but at this point was ready to proceed with surgical intervention  given the decreased quality of life.  She reviewed the risks of  infection, DVT, component failure, need for postoperative therapy, and  expectations of the surgery and consent was obtained.   PROCEDURE IN DETAIL:  The patient was brought to operative theater.  Once adequate anesthesia and preoperative antibiotics, 1 gram of Ancef,  were administered, the patient was positioned supine.  A proximal thigh  tourniquet was placed. The right lower extremity was then pre-scrubbed  and prepped and draped in a sterile fashion.  A midline incision was  made followed by a modified median  parapatellar arthrotomy.  The patella  was supple.  Following the arthrotomy, there was identification of  severe patellofemoral changes with significant ridging in the trochlear,  anterior aspect of the femur, as well as the patella.  Patellar exposure  was obtained including debridement of fragments and osteophytes around  the knee.   Our attention was first directed to the patellar cut.  Precut  measurement of the central portion of the patella was only about 15 mm.  I tried to resect as little as possible down to 13, keeping as much of  the thickness of the patella as I possibly could.  Following further  debridement, I was able to maintain a thickness of 12 to 13 mm.  I felt  the 38 patellar button restored best.  This got her back to a 22 thick  patella which is normal for a female.   These holes were made and I left the patellar button in place to protect  this thin patella from retractor placement.  This helped to decompress  the joint at this point. Further synovectomy was carried out as well as  debridement  of the anterior and medial lateral fat pad. At this point,  proximal medial peel was carried out for knee exposure.  I used an  osteotome to debride osteophytes off the central notch area as well as  debride the posterior and anterior cruciate ligaments. The femoral canal  was then opened with a drill and irrigated to prevent fat emboli.  Intramedullary rod was passed and at 5 degrees of valgus, I resected 10  mm of bone.   I sized the femur to be a 2.5.  The anterior, posterior, and chamfer  cuts were then made without complication.  This got [place] to a fresh cut  bone surface.  I checked the medial and lateral dimension with a 2.5  femur and was very happy with the sizing of it.  The final box cut was  then made on the central aspect of the distal femur.  Following this and  further debridement of some osteophytes on the medial and lateral aspect  and posterior aspect of  the knee including a very large loose body in  the lateral gutter that was identified radiographically, attention was  then directed to the tibia.  Tibial exposure was obtained including  meniscectomies and further debridement of the PCL and stump.   With the extramedullary guide in position, I chose to resect 8 mm of  bone off the high side of the lateral proximal tibia based on  radiographs in a relatively neutral position.  Following the bone cut  and debridement, I checked with an extension block and the knee came out  to full extension with a 10 mm extension block from the rotating  platform system.  Given this finding, I went ahead and sized the  proximal surfaces and felt that the 2.5 fit best with a pr-narrow  anterior-posterior dimension in the lateral aspect of the tibia, this  allowed for rotation of the medial third of the tubercle.  I pinned it  and checked the alignment and was happy that the cut was at least in a  perpendicular if not slightly valgus position.   Then, the tibial component was pinned, drilled and keel punched.  The  trial reduction then carried out with a 25. Femur, 2.5 tibia, and a 10  mm insert.  The knee was noted to come out in full extension just like  her preoperative position.  She flexed with the patella tracking without  application of pressure.  At this point, all trial components were  removed and further debridements as necessary were carried out, and the  knee irrigated with normal saline solution.  I then injected the knee  with 60 mL of 0.25% Marcaine with epinephrine and 1 mL of Toradol.  The  cement was mixed as the final components were opened.  The components  were then cemented in position with the tibia first, then the femur, and  then patella.  Extruded cement was debrided.  Once the cement had cured,  the knee was examined thoroughly for any loose fragments of cement. Once I was satisfied that none were visualized including the  posterior  aspect of the knee, the final size 10 x 2.5 poly was inserted. The knee  was reduced.   The knee was again irrigated.  I injected 10 mL of FloSeal into the  posterior aspect of the medial and lateral gutters and suprapatellar  pouch region.  No drain was used.  I then reapproximated the extensor  mechanism using a #1 Vicryl.  The remainder of the wound was closed with  2-0 Vicryl and a running 4-0 Monocryl.  The knee was cleaned, dried and  dressed sterilely with Steri-Strips, dressing sponges, and a bulky  dressing.  She was then brought to the recovery room in stable  condition.      Madlyn Frankel Charlann Boxer, M.D.  Electronically Signed     MDO/MEDQ  D:  08/09/2006  T:  08/09/2006  Job:  [id number]

## 2010-08-18 NOTE — Assessment & Plan Note (Signed)
[facility]                         GASTROENTEROLOGY OFFICE NOTE   Pamela, Snow                    MRN:          [id number]  DATE:10/18/2006                            DOB:          [birth year]    REFERRING PHYSICIAN:  Pattricia Snow is doing well.  She is taking daily MiraLax.  She is having no  further constipation problems.  She had stopped her Nexium because she  was having no reflux symptoms.  She has had a long history of chronic  recurrent gastroesophageal reflux disease.  I have advised her not to  discontinue her Nexium.  In fact, I saw her in January of this past year  with chest pain felt secondary to acid reflux.  She had a rather  prominent hiatal hernia at that time.   Her constipation was really the result of a recent knee surgery by Dr.  Charlann Snow.  She did not wish to take any medications for constipation, except  for MiraLax, and was doing extremely well.  She has had several  colonoscopies, which have been unremarkable, most remarkable in October  of 2005.  She does have diverticulosis.   PHYSICAL EXAM:  VITAL SIGNS:  Normal.  The patient refused to be  weighed.  General physical exam was not performed.   ASSESSMENT:  1. Chronic gastroesophageal reflux disease with the need for chronic      proton pump inhibitor therapy.  2. Chronic functional constipation, doing well on MiraLax therapy.  3. Asymptomatic diverticulosis coli.  4. Degenerative arthritis with right knee replacement and planned      upcoming left knee replacement.   RECOMMENDATIONS:  1. Continue daily MiraLax and we will try Amitiza if needed.  2. High fiber diet, liberal p.o. fluids.  3. Reflux regimen with daily Nexium therapy.  4. P.r.n. GI followup as needed.     Vania Rea. Jarold Motto, MD, Caleen Essex, FAGA  Electronically Signed    DRP/MedQ  DD: 10/18/2006  DT: 10/18/2006  Job #: [id number]

## 2010-08-18 NOTE — H&P (Signed)
NAME:  Pamela Snow, Pamela Snow             ACCOUNT NO.:  [id number]   MEDICAL RECORD NO.:  [id number]          PATIENT TYPE:  LINP   LOCATION:                                FACILITY:  [facility]   PHYSICIAN:  Madlyn Frankel. Charlann Boxer, M.D.  DATE OF BIRTH:  [birth year]   DATE OF ADMISSION:  08/09/2006  DATE OF DISCHARGE:                              HISTORY & PHYSICAL   PROCEDURE:  Right total knee replacement.   CHIEF COMPLAINT:  Right knee pain.   HISTORY OF PRESENT ILLNESS:  This is a 70 year old female with a history  of persistent progressive right knee pain.  It has been refractory to  all conservative treatments.  Due to her diminished quality of life and  limited options, she has been scheduled for a right total knee  replacement.   Her primary care physician is Dr. Creola Corn, whom she is going to be  seeing on Aug 04, 2006 for presurgical clearance.   PAST MEDICAL HISTORY:  1. Osteoarthritis.  2. Lumbar degenerative disk disease.  3. Reflux disease.   PAST SURGICAL HISTORY:  Hysterectomy in 2002 with some nausea and  vomiting after surgery.   FAMILY HISTORY:  Cancer.   SOCIAL HISTORY:  She is married.  Husband, Nadine Counts, will be primary  caregiver after surgery.   DRUG ALLERGIES:  NO KNOWN DRUG ALLERGIES.   MEDICATIONS:  1. Synthroid 50 mcg one p.o. daily.  2. Nexium 40 mg one p.o. daily.  3. Triamterene HCTZ 37.5 one p.o. daily.  4. Calcium 500 mg one p.o. daily.   REVIEW OF SYSTEMS:  GASTROINTESTINAL:  history of constipation.  GENITOURINARY:  History of the recurring urinary tract infections.  GYNECOLOGIC:  Postmenopausal.  Otherwise, see history of present  illness.   PHYSICAL EXAMINATION:  VITAL SIGNS:  Pulse 64, respirations 18, blood  pressure 150/84.  Height 5 feet 8 inches, weight 165 pounds.  GENERAL:  She is awake, alert and oriented, well-developed, well-  nourished in no acute distress.  NECK:  Supple.  No carotid bruits.  CHEST:  Lungs clear to auscultation  bilaterally.  BREASTS:  Deferred.  HEART:  Regular rate and rhythm without gallops, clicks, rubs or  murmurs.  ABDOMEN:  Soft, nontender, nondistended.  Bowel sounds present.  GENITOURINARY:  Deferred.  EXTREMITIES:  Right lower extremities goes out to full extension with  flexion to 120+, no effusion.  Stable all four quadrants.  No increased  pain with valgus loading.  SKIN:  Dorsalis pedis pulses positive.  No signs of cellulitis, right  lower extremity.  NEUROLOGIC:  She has intact distal sensibilities, right lower extremity.   LABORATORIES/ELECTROCARDIOGRAM/CHEST X-RAY:  All pending presurgical  clearance results.   IMPRESSION:  Right knee osteoarthritis.   PLAN OF ACTION:  Right total knee replacement on Aug 09, 2006 at [facility] by surgeon, Dr. Durene Romans.   Risks and complications were discussed.  Questions were encouraged,  answered and reviewed.  During her history and physical, we did discuss  the use of Celebrex before surgery which would include 1 by mouth twice  a day 2 days  prior surgery, and on the morning of surgery, she will  actually take 2 in the morning.  This will be done with just a minimum  sip of water.  I will also want to follow up with the administration of  Celebrex on postoperative day #1 and #2, as well.     ______________________________  Yetta Glassman. [address], [place]      Madlyn Frankel. Charlann Boxer, M.D.  Electronically Signed    BLM/MEDQ  D:  08/03/2006  T:  08/03/2006  Job:  [id number]

## 2010-08-18 NOTE — Assessment & Plan Note (Signed)
[facility]                         GASTROENTEROLOGY OFFICE NOTE   [name], Snow                    MRN:          [id number]  DATE:08/23/2006                            DOB:          06-15-1940    CLINIC NOTE   Pamela Snow underwent knee replacement on Aug 09, 2006 and was taking  every 4 hour codeine and became severely impacted, requiring  hospitalization at [facility] on Aug 10, 2006.  She had  multiple enemas and finally became dis-impacted, was discharged on twice  a day MiraLax.  Review of her lab data from that hospitalization shows  that she had mild hypokalemia on admission, but also had markedly  abnormal liver function tests with an SGOT of 182, SGPT 143, and a  normal alkaline phosphatase and bilirubin.  By the time she was  discharged, her SGOT had decreased to 133, but her SGPT was 153.  Previous liver function tests checked within the last year had been  unremarkable.  She gives no known history of liver disease or family  history of liver disease.  She uses alcohol socially, but does not have  alcohol dependency.  Only other surgical procedure was hysterectomy in  2002.  Recent knee surgery by Dr. Durene Romans did not include general  anesthesia.   EXAM:  She weighs 175 pounds and blood pressure is 108/64, pulse is 80  and regular.  She was a tired-appearing white female in no acute distress.  She obviously had a swollen right knee, but there was no evidence of  edema in her leg, phlebitis, or tenderness to examination.  ABDOMEN:  No organomegaly, masses, or tenderness.  Bowel sounds were  normal.  RECTAL:  Deferred.   ASSESSMENT:  1. Pamela Snow has had good response to MiraLax, but has really not      been using the narcotics since her discharge, but has been advised      to go back on these by orthopedics.  She will certainly need to be      careful about repeat impactions with narcotic use.  2. Unexplained  abnormal liver function tests, which may be a reactive      hepatitis versus other etiologies to liver disease.  She denies any      other over-the-counter drug use.  Her only medications at this time      are daily Synthroid, Nexium, Maxzide 27.5 mg a day, and NSAID.   RECOMMENDATIONS:  1. Repeat CBC, metabolic profile, and electrolytes.  2. Repeat liver profile and proceed accordingly.  3. Start Amitiza 24 mcg twice a day with meals as tolerated with      p.r.n. MiraLax use as needed.  4. GI followup in 3 weeks' time.   ADDENDUM:  This patient did have ultrasound exam in January of 2008,  which was unremarkable.  There was no evidence of cholelithiasis, and  the liver appeared normal on this examination.     Pamela Rea. Jarold Motto, MD, Caleen Essex, FAGA  Electronically Signed    DRP/MedQ  DD: 08/23/2006  DT: 08/23/2006  Job #: [id number]  cc:   [name], MD  Madlyn Frankel. Charlann Boxer, M.D.

## 2010-08-20 LAB — CULTURE, BLOOD (ROUTINE X 2)
Culture  Setup Time: 201205112334
Culture  Setup Time: 201205112334
Culture: NO GROWTH
Culture: NO GROWTH

## 2010-08-21 NOTE — H&P (Signed)
[facility]  Patient:    Pamela Snow, Pamela Snow                      MRN: [id number] Attending:  Gretta Cool, M.D. CC:         Esmeralda Arthur, M.D.  Jonelle Sports. Cheryll Cockayne, M.D.   History and Physical  CHIEF COMPLAINT:  Referred by Dr. Christie Beckers on emergency basis for evaluation of severe menometrorrhagia.  HISTORY OF PRESENT ILLNESS:  A 70 year old white married gravida 4, para 3, AB1 with a history of hysteroscopy with resection of endometrial polyp on May 17, 2000.  She did very well with very scant bleeding following the surgery until today when she began to have extremely heavy flow, passing large clots, having severe cramps.  Her flow is sufficient to come through pads and her clothing in 10-15 minutes.  Her bleeding has continued to be exceedingly heavy.  She saw Dr. Katrinka Blazing and was placed on high dose Premarin and referred for further evaluation and management.  She has a history of previous hysteroscopy, resection of polyp and fibroid. She had reasonable control of bleeding on Combipatch until recently when she has had recurrence of difficulty leading to repeat hysteroscopy.  She is now referred for definitive therapy.  PAST MEDICAL HISTORY:  Usual childhood diseases without sequelae.  Medical illnesses:  None.  ALLERGIES:  None known.  CURRENT MEDICATIONS: 1. Celexa 20 mg q.d. for depression. 2. Premarin 1.25 mg p.o. q.6h. for severe bleeding. 3. Prilosec for reflux.  PAST SURGICAL HISTORY:  D&C and hysteroscopy 1998, repeat hysteroscopy February 2002 both by Dr. Katrinka Blazing.  FAMILY HISTORY:  Father died of MI after surgery for gastric ulcer disease at age 14.  Mother is living with hypertension, colon cancer diagnosed 25 years ago.  No evidence of recurrent disease.  REVIEW OF SYSTEMS:  HEENT:  Denies symptoms.  CARDIORESPIRATORY:  Denies asthma, cough, bronchitis, shortness of breath. GASTROINTESTINAL/GENITOURINARY:  Denies  frequency, urgency, dysuria, change in bowel habits, food intolerance.  PHYSICAL EXAMINATION  GENERAL:  Well-developed, well-nourished white female.  HEENT:  PERRLA.  Fundi:  Benign.  Oropharynx:  Clear.  NECK:  Supple without mass or thyroid enlargement.  CHEST:  Clear to P to A.  HEART:  Regular rhythm without murmur or cardiac enlargement.  BREASTS:  Soft without mass, nodes, notable discharge.  ABDOMEN:  Soft, scaphoid without mass or organomegaly.  PELVIC:  External genitalia:  Normal female.  Vagina filled with clot approximately 1/2 unit present at the time of examination.  Bleeding vigorously through the cervix.  Cervix is parous.  Uterus is upper limits of normal size.  Adnexa:  Clear.  Rectovaginal confirms.  Moderate tenderness bilaterally.  EXTREMITIES:  Negative.  NEUROLOGIC:  Physiologic.  IMPRESSION: 1. Severe menometrorrhagia following hysteroscopy, resection of endometrial    polyps. 2. Depression. 3. Prominent thyroid.  PLAN:  I have discussed alternatives including no therapy, medical therapy, conservative management, and definitive therapy by hysterectomy, salpingo-oophorectomy.  She is extremely frightened and ready for definitive therapy, wishes to proceed as soon as possible to definitive therapy by hysterectomy.  The extreme degree of her bleeding is such that I have concurred and recommended on to definitive therapy as soon as we can arrange. I have discussed all alternatives, risks, benefits totally.  Discussed recovery, pain management.  Discussed risks increased by her present therapy. Recommended consideration of Lovenox preoperatively as well as compression stockings.  On to definitive therapy as soon as possible.  Patch during recovery.  Vaginal estrogen during recovery. DD:  05/27/00 TD:  05/30/00 Job: [id number]

## 2010-08-21 NOTE — Op Note (Signed)
[facility]  Patient:    Pamela Snow, Pamela Snow                    MRN: [id number] Proc. Date: 06/23/99 Adm. Date:  [id number] Attending:  Amanda Cockayne                           Operative Report  PREOPERATIVE DIAGNOSIS:       Irregular spotting on hormone therapy and abnormality                               of the endometrial cavity on hysterosonogram.  POSTOPERATIVE DIAGNOSIS:      Submucous fibroid  OPERATION:  SURGEON:                      Esmeralda Arthur, M.D.  ASSISTANT:  ANESTHESIA:  ESTIMATED BLOOD LOSS:  PACKS:                        None.  CATHETERS:                    None.  MEDIUM:                       Sorbitol  DEFICIT:                      230 cc  FINDINGS:                     Submucous fibroid in endometrial cavity, otherwise fairly atrophic.  No other abnormalities were noted.  INDICATIONS:  DESCRIPTION OF PROCEDURE:     The patient was carried to the operating room and  after satisfactory general anesthesia, with the patient in the lithotomy position and prepped and draped in a sterile field.  The patient was examined and the uterus felt to be slightly anterior.  No masses felt in the adnexae.  A weight speculum was placed in the posterior vagina.  Cervix was grasped with  tenaculum and the uterus sounded 9 cm.  The cervix was then progressively dilated with Hegar dilators to #23.  The observation scope was placed in.  Looked on her right side like she had atrophic almost endometrium.  On the left side she had ome stimulation and what I think is a fibroid and not a polyp.  We then dilated her to a #33 using the cutting scope, we resected this.  Then cauterized areas that were bleeding.  We withdrew the scope and watched her for ten minutes.  She had no excessive bleeding.  Procedure was terminated.  The patient was carried to the recovery room in good  condition. DD:  06/23/99 TD:   06/24/99 Job: [id number]

## 2010-08-21 NOTE — Assessment & Plan Note (Signed)
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   HISTORY OF PRESENT ILLNESS:  Pamela Snow has had chronic acid reflux for  many years, but has done well on Nexium 40 mg q.a.m.  Her last endoscopy  showed an esophageal stricture.  She had an esophageal dilatation in  October 2005.  She has recently had rather severe burning substernal  chest pain both daytime and nighttime, without dysphagia.  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  She sees Dr. [name] and Dr. Shelda Pal.  There is no planned knee replacement surgery.   She denies hepatobiliary complaints such as clay-colored stools, dark,  icterus, fever or chills.  Her last ultrasound examination was in March  2000.  She has chronic functional constipation, but is doing well at  this time.  She has a very strong family history of colon carcinoma in  her mother at age 60 and her sister, who had colon cancer at age 64.  Pamela Snow last colonoscopy was 2-1/2 years ago.  She desires a repeat  examination at this time.  She denies melena, hematochezia, anorexia or  weight loss or other systemic complaints.   PAST MEDICAL/SURGICAL HISTORY:  Remarkable for previous hysterectomy in  2002.   CURRENT MEDICATIONS:  1. She is on Synthroid for chronic thyroid displacement.  2. Calcium pills daily.  3. Fish oil daily.  4. Nexium.  5. Recently she has been taking six Advil a day.   ALLERGIES:  No known drug allergies.   FAMILY HISTORY:  Remarkable for colon cancer, as mentioned above.   SOCIAL HISTORY:  She is married and lives  with her husband.  She has a  [employer].  She smokes rarely.  Uses ethanol socially.   PHYSICAL EXAMINATION:  GENERAL:  She is an attractive healthy-appearing  white female, appearing younger than her stated age.  VITAL SIGNS:  She is 5 feet 8 inches tall, weighs 177 pounds, blood  pressure 130/80, pulse 76 and regular.  HEENT:  I could not appreciate stigmata of chronic liver disease.  CHEST:  Entirely clear.  HEART:  She was in a regular rhythm without murmurs, gallops or rubs.  ABDOMEN:  No hepatosplenomegaly.  No abdominal masses or tenderness.  Bowel sounds normal.  EXTREMITIES:  Peripheral extremities unremarkable.  NEUROLOGIC:  Mental status normal.   ASSESSMENT:  1. Worsening acid reflux, probably secondary to non-steroidal anti-      inflammatory drug use.  2. Rule out recurrent peptic stricture in the esophagus.  3. Rule out cholelithiasis.  4. Strong family history of colon carcinoma, with the need for      followup per the patient's request.  5. History of degenerative arthritis  with most of her pain in her      knees bilaterally.   RECOMMENDATIONS:  1. Outpatient endoscopy and colonoscopy at her convenience.  2. Strict anti-reflux maneuvers.  Will place her on Nexium 40 mg      b.i.d. with Carafate liquid 5 mL after meals and at bedtime and      p.r.n. sublingual.  3. Levsin 0.125 mg q.6-8h.  4. Ultrasound examination to exclude cholelithiasis.  5. Stop NSAID use and have placed her on Darvocet-N-100 q.12h. p.r.n.  6. Screening laboratory parameters at her request.  7. Continue other medications as per Dr. [name].     Pamela Rea. Jarold Motto, Pamela Snow, Caleen Essex, FAGA  Electronically Signed    DRP/MedQ  DD: 04/19/2006  DT: 04/19/2006  Job #: [id number]   cc:   [name], Pamela Snow

## 2010-08-21 NOTE — Discharge Summary (Signed)
[facility]  Patient:    Pamela, Snow                      MRN: [id number] Adm. Date:  05/27/00 Disc. Date: 05/28/00 Attending:  Gretta Cool, M.D. Dictator:   Eve F.N.P. CC:         Esmeralda Arthur, M.D.  Jonelle Sports. Cheryll Cockayne, M.D.   Discharge Summary  HISTORY OF PRESENT ILLNESS:  Ms. Impson is a 70 year old white married female, gravida 4, para 3, AB 1, with a history of hysteroscopy with resection of an endometrial polyp on May 17, 2000.  Dr. Christie Beckers referred her on an emergency basis for evaluation of severe menometrorrhagia.  She did very well after her hysteroscopy with very scant bleeding until the May 17, 2000, at which time she presented with extremely heavy bleeding, passing large clots, and having severe cramping.  The bleeding continued to be excessively heavy, and Dr. Katrinka Blazing placed her on high dose Premarin and referred her for further evaluation and management.  She had done well previously on Combipatch until recently when she had recurrence which led to the repeat hysteroscopy. She is now referred for definitive therapy.  PHYSICAL EXAMINATION:  CHEST:  Clear to auscultation and percussion.  HEART:  Regular rate and rhythm, without murmur, gallop, or cardiac enlargement.  ABDOMEN:  Soft and scaphoid without masses or organomegaly.  PELVIC:  External genitalia within normal limits for female.  Vagina filled with clots approximately 1/2 unit present at time of examination.  Cervix was parous and there was very active bleeding.  Uterus is upper normal limits of size.  Adnexa bilaterally clear, but moderately tender.  Rectovaginal examination confirms.  IMPRESSION: 1. Severe menometrorrhagia following hysteroscopy, resection of endometrial    polyps. 2. Depression. 3. Prominent thyroid.  PLAN:  Alternative therapies were discussed with the patient, and due to anxiety, she elected to proceed with  definitive therapy.  Risks and benefits were discussed with the patient, and she accepts these procedures.  LABORATORY DATA:  Admission hemoglobin 12.0, hematocrit 35.1.  On the first postoperative day hemoglobin was 9.1, hematocrit 26.9, and a white count of 7.3.  The remainder of her preoperative laboratory work was within normal limits.  EKG showed normal sinus rhythm, septal infarct, age undetermined, abnormal ECG.  Chest x-ray showed no active disease.  HOSPITAL COURSE:  The patient underwent laparoscopic assisted hysterectomy and bilateral salpingo-oophorectomy under general anesthesia.  The procedures were completed without any complications, and the patient was returned to the recovery room in excellent condition.  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  Left tube and ovary benign tubo-ovarian adhesions, benign peritubal adhesion, and no evidence of malignancy.  Her postoperative course was without complications, and she was discharged on the first postoperative day in excellent condition.  FINAL DISCHARGE INSTRUCTIONS:  No heavy lifting or straining, no vaginal entrance, increase ambulation as tolerated.  She is to call for any fever over 100.5, or failure of daily improvement.  DIET:  Regular.  MEDICATIONS:  Same as prior to surgery, with the addition of Vioxx 25 mg q.d. and Tylox one p.o. q.4-6h. p.r.n. discomfort.  FOLLOWUP:  She is to return to the office in one week for followup.  CONDITION ON DISCHARGE:  Excellent.  FINAL DISCHARGE DIAGNOSES: 1. Severe menorrhagia. 2.  History of hysteroscopic resection of endometrial polyp.  PROCEDURES PERFORMED:  Laparoscopic hysterectomy and bilateral salpingo-oophorectomy under general  anesthesia. DD:  06/06/00 TD:  06/06/00 Job: [id number]

## 2010-08-21 NOTE — Op Note (Signed)
[facility]  Patient:    Pamela Snow, Pamela Snow            MRN: [id number] Proc. Date: 05/27/00 Adm. Date:  [id number] Disc. Date: [id number] Attending:  Katrina Stack CC:         Esmeralda Arthur, M.D.  Jonelle Sports. Cheryll Cockayne, M.D.   Operative Report  PREOPERATIVE DIAGNOSES: 1. Severe menorrhagia. 2. History of hysteroscopic resection of endometrial polyp.  POSTOPERATIVE DIAGNOSES: 1. Severe menorrhagia. 2. History of hysteroscopic resection of endometrial polyp.  PROCEDURES:  Laparoscopic hysterectomy, bilateral salpingo-oophorectomy.  SURGEON:  Gretta Cool, M.D.  ASSISTANT:  Raynald Kemp, M.D.  DESCRIPTION OF PROCEDURE:  Under excellent general anesthesia, endotracheal, with patient prepped and draped in Allen stirrups with Hulka tenaculum applied to her cervix and bladder drained, a subumbilical incision was made and the Veress cannula introduced. After adequate pneumoperitoneum with carbon dioxide, laparoscope trocar was introduced and the pelvic organs visualized. There was no evidence of intra-abdominal pathology. Accessory ports were placed in both lower quadrants under direct vision. At this point, Seitzinger tripolar forceps were introduced, and the infundibulopelvic vessels cauterized then transected. The round ligament was then also transected, and the parametrial tissue adjacent to the uterus and down to the level of the uterine arteries was cauterized and transected on each side. At this point, the abdominal portion of the procedure was discontinued, and the procedure resumed below. The patient was repositioned for the vaginal portion of the procedure. The cervix was then grasped with a single-tooth tenaculum and pulled down into view. The mucosa was then circumcised, and the mucosa pushed off the lower uterine segment. The cul-de-sac was then entered by [facility] scissors. The uterosacral ligaments clamped, cut, sutured, and  tied with 0 Vicryl. The cardinal ligaments were then clamped, cut, sutured, and tied with 0 Vicryl. At this point, the anterior peritoneum was opened, and a Deaver placed beneath the bladder for retraction. The uterine vessels were then clamped, cut, sutured, and tied with 0 Vicryl. At this point, a straight Masterson clamp was used to connect the upper portion of the dissection done laparoscopically with the below dissection. The pedicle was transected and then ligated with 0 Vicryl. At this point, examination of all of the pedicles revealed no evidence of bleeding. A pursestring suture was then placed in the peritoneum with high ligation of the cul-de-sac. Permanent sutures of 0 Ethibond were then used to perform a uterosacral cardinal complex suspension including uterosacral ligament as far up as it could be identified. Was secured to the posterior vaginal cuff to the cardinal ligament and to the anterior vaginal wall fascia. Sutures were placed on each side. Full-thickness closure of the vaginal mucosa was then undertaken, including all of the endopelvic fascia from anterior to posterior. Note:  A careful attempt was made to secure the posterior perirectal fascia to help prevent enterocele formation. The cuff was then closed with interrupted sutures of subcuticular sutures of 2-0 Vicryl. At this point, the cuff was dry. The attention was then turned to the laparoscopic portion of the procedure once again. The pedicles were carefully examined. One small bleeding point was noted on the right round ligament which was treated by bipolar cautery. Pelvis was irrigated free of debris and the cuff examined. All of the pedicles were dry. At this point, with reduced pressure, the pedicles remained dry. The gas was allowed to escape, and the laparoscope instruments removed. The umbilical incision and lower incisions were then closed with 5-0 Vicryl  and skin closure of Steri-Strips. At the end of  the procedure, the sponge and lap counts were correct. There were no complications. The patient returned to the recovery room in excellent condition. DD:  05/27/00 TD:  05/30/00 Job: [id number]

## 2010-08-21 NOTE — Discharge Summary (Signed)
NAME:  Pamela Snow, Pamela Snow             ACCOUNT NO.:  [id number]   MEDICAL RECORD NO.:  [id number]          PATIENT TYPE:  OBV   LOCATION:  1332                         FACILITY:  [facility]   PHYSICIAN:  Iva Boop, MD,FACGDATE OF BIRTH:  01-01-1941   DATE OF ADMISSION:  08/15/2006  DATE OF DISCHARGE:  08/16/2006                               DISCHARGE SUMMARY   ADMITTING DIAGNOSES:  5. A 70 year old white female with fecal impaction one week status      post right knee replacement.  2. Hypokalemia.  3. Anticoagulation secondary to recent knee replacement.  4. Transaminitis likely medication induced.   DISCHARGE DIAGNOSES:  1. Resolved fecal impaction.  2. Hypokalemia corrected.  3. Transaminitis, likely medication induced.  Follow as outpatient  4. One week status post right knee replacement.   CONSULTATIONS:  None.   PROCEDURES:  None.   BRIEF HISTORY:  Pamela Snow is a very nice 70 year old white female known  to Dr. Sheryn Bison with history of GERD and chronic constipation.  She had a negative colonoscopy in January 2008.  She had undergone right  knee replacement on Aug 09, 2006, and is on home Lovenox b.i.d.  currently.  She says she has not had a bowel movement since her surgery.  Has become increasingly uncomfortable over this past weekend to the  point of being nauseated and having a lot of rectal pain and pressure.  She states it feels like there is a block of cement going through the  eye of the needle.  She has tried multiple laxatives, enemas, etc. at  home without any results.  At this time, she is admitted for observation  of pain control, bowel prep and disimpaction.   LABORATORY STUDIES:  WBC of 12.5, hemoglobin 13.2, hematocrit of 38.4,  MCV of 89, platelets 444, potassium was 3, BUN 19, creatinine 1.0,  albumin 3.4, AST was 182, ALT of 143, alk phos 46.  Follow-up on May 13,  AST of 133, ALT of 153.   X-RAY STUDIES:  Abdominal films on May 12 shows a large  amount of feces  in the rectum consistent with a fecal impaction.  No obstruction or free  air.   HOSPITAL COURSE:  The patient was admitted to the service of Dr. Stan Head who was covering the hospital.  She was placed on a clear liquid  diet, started on some IV fluids.  Her potassium was replaced.  She was  continued on Lovenox and received tap water enemas, a manual  disimpaction, mineral oil enemas and a MiraLax prep.  By the following  morning, her infection was resolved, and she felt much better.  She was  otherwise stable and was allowed to discharge to home.  She was to  follow-up potassium level with Dr. Kennedy Bucker in 2 weeks, follow up with  Dr.  Jarold Motto as needed.  She was asked to stay on MiraLax 17 grams b.i.d.  while on pain medications postop for her knee, and then daily  thereafter.  She was not discharged on any additional potassium as it  had corrected.  She was continued on  her Maxzide 37.5 daily, Synthroid  0.5 daily and Nexium 40 p.o. daily.      Amy Esterwood, PA-C      Iva Boop, MD,FACG  Electronically Signed    AE/MEDQ  D:  09/27/2006  T:  09/27/2006  Job:  [id number]   cc:   Madlyn Frankel Charlann Boxer, M.D.  Fax: [phone]   Vania Rea. Jarold Motto, MD, FACG, FACP, FAGA  [address]  [place]  [place] [address]

## 2010-08-21 NOTE — H&P (Signed)
[facility]  Patient:    Pamela Snow, Pamela Snow                         MRN: [id number] Dictator:   Esmeralda Arthur, M.D.                         History and Physical  HISTORY OF PRESENT ILLNESS:   This is a 70 year old female para 4-3-1 who is admitted to the hospital for a hysteroscopy and probable removal of polyp. The patient has had spotting and some pain in her abdomen.  She has had this for about three weeks ago.  She had pain in the lower abdomen and with cramps and she had light spotting for nine days.  Her last menstrual period was March 1; she is on the CombiPatch.  Because of this and her previous history of a polyp and previous history of fibroids, she had a hysterosonogram which showed the uterus to be 8.3 x 3.2 x 5 with an endometrial strip of 2.2 mm, but on hysterosonogram she had two echogenic areas 7 x 3 and 6 x 3 in the uterus which were also missed.  Her right ovary measures 2.8 x 1.5 x 2.6 with a thin-walled cyst and her left ovary is 3.2 x 1.5 x 2.1 with a cyst.  We plan to repeat her ultrasound in six months.  The patient has had previous surgery for endometrial fibroid at this institution in March 2001, at which time she had a fibroid which was resected.  She also had a large polyp removed in 1993 which had presented symptoms were severe cramps.  Her last Pap smear was November 19, 1999, and it was reported as normal.  MEDICATIONS:                  Her medications include the CombiPatch which she is currently taking, calcium.  She takes Xanax and she takes Maxzide.  She has been taking Celexa 20 mg everyday and she thinks she feels much better on that.  REVIEW OF SYSTEMS:            No problems.  She has had no problems with her heart, lungs or respiratory.  She had no problems except for the spotting. She is on the Celexa.  PREVIOUS SURGERY:             She has had a D&C and hysteroscopy x 2 and she has had her tonsils  done.  PHYSICAL EXAMINATION:  GENERAL:                      This is a well-developed, well-nourished female oriented and alert.  HEART:                        Normal sinus rhythm.  LUNGS:                        Clear to P&A.  ABDOMEN:                      The liver, spleen, kidney are not enlarged.  PELVIC EXAMINATION:           The external genitalia within normal limits. Vagina: She has good support.  The cervix is epithelialized.  The uterus is slightly enlarged.  There are no masses.  Rectovaginal confirms.  IMPRESSION:                   1. Postmenopausal spotting and cramps.                               2. Endometrial polyps x 2.                               3. On Celexa 20 mg everyday and Xanax for sleep.  DISPOSITION:                  Admit for hysteroscopy, D&C and removal of polyps. DD:  05/16/00 TD:  05/16/00 Job: [id number]

## 2010-08-21 NOTE — Op Note (Signed)
[facility]  Patient:    Pamela Snow, Pamela Snow                    MRN: [id number] Proc. Date: 05/17/00 Adm. Date:  [id number] Attending:  Amanda Cockayne                           Operative Report  PREOPERATIVE DIAGNOSES:       1. Spotting postmenopausal on continuous                                  hormonal therapy.                               2. Endometrial polyp.                               3. Endometrial irregularity on hysterosonogram,                                  previous endometrial polyp, previous                                  endometrial fibroid.  POSTOPERATIVE DIAGNOSES:      1. Spotting postmenopausal on continuous                                  hormonal therapy.                               2. Endometrial polyp.                               3. Endometrial irregularity on hysterosonogram,                                  previous endometrial polyp, previous                                  endometrial fibroid.  OPERATION:                    ______  SURGEONEsmeralda Arthur, M.D.  ANESTHESIA:                   General.  PACKS:                        None.  CATHETERS:                    None.  MEDIUM:                       Sorbitol.  DEFICIT:  60 cc.  FINDINGS:                     She had an endometrial polyp and submucous fibroid. The fibroid was resected. Polyp was removed.  DESCRIPTION OF PROCEDURE:     The patient carried to the operating room. Under satisfactory anesthesia, she was placed in the lithotomy position and she was positioned. She was prepped and draped in sterile field. Bladder was emptied via intermittent catheterization. Examination revealed a uterus to feel mid anterior. It felt normal size and no masses in the adnexa. Graves speculum was placed in the posterior vagina. The cervix was grasped with a tenaculum and the uterus sounded to 8 cm. The  cervix was then dilated to a #25 Hegar dilator. The observation scope was placed in to see a polyp and we could see a defect in the posterior floor of the uterus consistent with a fibroid. No other abnormalities were noted. We then dilated the cervix to a #33 and used the double loop resectoscope with cutting on 190 and cautery 120, and we removed the polyp and we also resected the fibroid. We then progressively decreased the pressure and any bleeding areas were cauterized. After bringing pressure to 30, we then removed the scope and after the leakage of the Sorbitol, we watched for excessive bleeding and there was none. The patient tolerated the procedure well and was carried to the recovery room in good condition. DD:  05/17/00 TD:  05/17/00 Job: [id number]

## 2010-08-21 NOTE — Discharge Summary (Signed)
[facility]  Patient:    Pamela Snow, Pamela Snow                      MRN: [id number] Adm. Date:  05/27/00 Disc. Date: 05/28/00 Attending:  Gretta Cool, M.D. Dictator:   Eve F.N.P.                           Discharge Summary  HISTORY OF PRESENT ILLNESS:  Ms. Goon is a 70 year old white married female, gravida 4, para 3, AB 1, who was referred to our office by Dr. Christie Beckers on an emergency basis for evaluation of severe menometrorrhagia.  She has a history of a hysteroscopy with resection of endometrial polyp on May 17, 2000, and did well at that time with very scant bleeding until the day of admission when she began to have extremely heavy flow, passing large clots, and having severe cramping.  She was placed on high dose Premarin and referred for further evaluation.  She is now admitted for definitive therapy.  PHYSICAL EXAMINATION:  CHEST:  Clear to auscultation and percussion.  HEART:  Regular rate and rhythm without murmurs, gallops, or cardiac enlargement.  ABDOMEN:  Soft and scaphoid without masses or organomegaly.  PELVIC:  External genitalia within normal limits for female, vagina filled with clots approximately 1/2 unit present at the time of examination.  The bleeding was vigorously through the cervix.  The cervix is parous.  Uterus was up in normal limit of size.  Adnexa bilaterally clear.  Rectovaginal examination confirmed moderate tenderness bilaterally.  IMPRESSION: 1. Severe menometrorrhagia following hysteroscopy.  Resection of endometrial  polyps. 2. Depression. 3. Prominent thyroid.  PLAN:  Alternative plans, including no therapy, medical therapy, conservative management, definitive therapy by hysterectomy, bilateral salpingo-oophorectomy was discussed with the patient.  She is very anxious and requested definitive therapy as soon as possible.DD:  06/06/00 TD:  06/06/00 Job: [id number]

## 2010-09-02 NOTE — H&P (Signed)
NAME:  Pamela Snow, Pamela Snow NO.:  [id number]  MEDICAL RECORD NO.:  [id number]           PATIENT TYPE:  I  LOCATION:  1334                         FACILITY:  [facility]  PHYSICIAN:  [name], MD       DATE OF BIRTH:  11-Sep-1940  DATE OF ADMISSION:  08/14/2010 DATE OF DISCHARGE:                             HISTORY & PHYSICAL   PRIMARY CARE PROVIDER:  [name], MD  GASTROENTEROLOGIST:  Vania Rea. Jarold Motto, MD.  CHIEF COMPLAINT:  Recent diverticulitis, now with severe wrist, neck, leg, multijoint arthralgias and myalgias, failure to thrive, weakness and dehydration.  HISTORY OF PRESENT ILLNESS:  This is a 70 year old female who was otherwise healthy who was in for a sick work in office visit.  She was seen for diverticulitis type symptoms per Dr. Jarold Motto as an outpatient.  The first CT scan was dated April 10 and was compatible with uncomplicated sigmoid diverticulitis and a perigastric wall thickening due to underdistention and possibly gastritis noted.  She was treated with oral antibiotics.  The patient then reports that she did not tolerate the oral antibiotics and then needed IV antibiotics and inpatient admission for 5 days.  She reports she has been home since April 22.  There are CT scans and MRIs on the hospital computer dated April 19.  The CT scan on April 19 revealed inflammatory edematous changes around the proximal sigmoid colon consistent with diverticulitis, no evidence of abscess.  Interval increase in size in mesenteric lymph nodes around the proximal sigmoid colon, possibly presumably reactive.  Consider followup colonoscopy after the acute episode is passed to exclude any underlying associated mucosal lesion. Dr. Jarold Motto continued to see her as an outpatient.  He saw her and thought she was swollen, was failing to thrive, and was worried about serum sickness due to the recent use of antibiotics.  He ordered a sedimentation rate which was  which was okay but the CRP was extremely high and greater than 60.  He gave her one dose of Depo-Medrol and sent her home.  He got the labs back today and called me up and wanted me to take a look at her to see what I thought and see if I can help her out. When I had her in the room, her wrists were swollen.  She could not bend them.  She was having significant pain at the wrists.  She could not lift anything.  She was having significant arthralgias, especially in the knees, calves, ankles, shoulders.  She reports it is difficult to walk and sit up.  Her legs feel like they are going to give out.  She had some diarrhea and fortunately the diarrhea has resolved.  On review of past records, she had some recent C difficile stool studies which were negative.  She reported a temperature of 100 yesterday.  Her mouth is extremely dry.  She is clearly dehydrated.  She had a thick swollen tongue and presumed thrush.  Knees are very swollen.  The pain has been progressive since yesterday morning.  She reports she has been eating bananas, cheese crackers, water, just not  enough.  PAST MEDICAL HISTORY: 1. History of mood disorder with depression, under control. 2. History of peripheral edema. 3. History of GERD with stricture. 4. History of hypothyroidism. 5. History of insomnia. 6. History of diverticulosis. 7. History of severe sinusitis with prior headaches. 8. Degenerative disk disease. 9. Bilateral knee arthritis. 10.History of TAH-BSO in 2002. 11.Tonsil and adenoidectomy. 12.History of broken elbow. 13.Basal cell carcinoma of the nose. 14.Right total knee replacement, which was complicated by a fecal     impaction one week post surgery, and transaminitis which resolved     on its own. 15.Negative colonoscopy in January 2008 except for the diverticulosis. 16.Diverticulitis in April 2012.  FAMILY HISTORY:  Colon cancer, coronary artery disease, peripheral vascular disease, congestive  heart failure, colon cancer.  SOCIAL HISTORY:  Retired former Network engineer.  Married, three children, four grandchildren.  No regular smoking.  Glass of wine with dinner.  REVIEW OF SYSTEMS:  Chills, headaches, anorexia, fatigue, malaise, weight loss, nausea, diarrhea, change in bowel habits, back pain, joint pain, joint swelling, stiffness, arthritis, frustration.  A full review of systems was obtained and otherwise negative.  ALLERGIES:  PENICILLIN and CLINDAMYCIN.  MEDICATION LIST: 1. Alprazolam 0.25 mg t.i.d. p.r.n. 2. Calcium 600 mg by mouth twice daily. 3. Lexapro 10 mg p.o. daily. 4. Multivitamin one p.o. daily. 5. Nexium 40 mg p.o. daily. 6. Synthroid 50 mcg as directed daily. 7. Nascobal 500 mcg 0.1 mL nasal solution, 2.3 mL which is 8 sprays     once weekly. 8. Lunesta 3 mg at bedtime p.r.n. 9. Nucynta 100 b.i.d. 10.Vicodin 5/500 1 tablet p.o. p.r.n. per Dr. Jarold Motto.  PHYSICAL EXAM:  VITAL SIGNS:  Weight is 166 pounds, which is down 4 pounds since August of last year, height is 67.5 inches, temperature 98, pulse 100, blood pressure 110/70.  GENERAL:  She is dehydrated.  Her face is swollen.  She looks tired and ill.  HEENT:  Her eyes are a little sunken.  Ears, nose, throat is otherwise intact except for her tongue and mouth are very dry.  There is a coating on the tongue and it appears to be thrush.  NECK:  Supple, no masses.  Thyroid no nodules. RESPIRATORY:  Clear to auscultation bilaterally.  HEART:  Regular without murmurs, gallops or rubs.  No edema.  ABDOMEN:  Soft, nontender, abdominal scars noted.  No rash throughout her whole body.  No cervical lymphadenopathy.  MUSCULOSKELETAL:  She is weak, frail with significant arthralgias, myalgias, some synovitis noted.  Individual muscles and joints were assessed and they are strong but globally she is weak. NEUROLOGIC:  No neurologic deficits.  PSYCHIATRIC:  Mentally she is intact.  ASSESSMENT:  This is a  70 year old female status post prolonged antibiotic use for her recent diverticulitis who presents today with generalized weakness, failure to thrive, dehydration, thrush, abdominal pain, for evaluation and treatment.  Again, this is all after prolonged antibiotics.  There is some recent diarrhea, although recent Clostridium difficile was negative.  There is significant pain associated with this. Depo-Medrol was not helpful.  She will be admitted to the hospital for further evaluation and treatment.  PLAN: 1. IV fluids to correct the underlying dehydration. 2. DVT prophylaxis has been ordered. 3. I initially wrote for C difficile colitis to be rechecked but this     does not seem necessary as recent C difficile was negative and the     diarrhea has stopped. 4. Diflucan has been ordered for thrush. 5. Blood cultures  x2 have been obtained. 6. Dr. Jarold Motto maybe right that this might be only serum sickness     type issue and does need supportive care and potentially steroids.     Or, the other option is that this is a rheumatologic issue     triggered by the recent infection, antibiotics, etc. 7. She had labs with Dr. Jarold Motto yesterday.  The BMET was fine,     sedimentation rate was 29, CRP was 57 up from 4.2 2 to 3 weeks ago.     Again, she is status post Depo-Medrol shot which did not help.  Any     further steroid use may complicate any thrush-related issues.  Her     CBC was fine.  C difficile by PCR was negative on 04/22.  Recent CT     scans on 04/19 and earlier an MRI of 04/19  showed the     diverticulitis.  May need another CT scan.  Will start with KUB at     the moment.  Again, strongly considering systemic steroid use. 8. Significant amount of labs have been ordered and will follow.     Hopefully she will feel a lot better once we get some fluids in     her. 9. GERD:  She has been on PPI and she will remain on that in the     hospital. 10.Hypothyroidism:  Will check a  TSH. 11.Will check CK and see if there is any myopathy noted. 12.Diverticular disease with recent outpatient and inpatient     treatments with antibiotics.  Will hold on further antibiotics at     this current time and again consider repeat testing if the     abdominal symptoms correlate. 13.For her underlying arthralgias, pain control is strongly needed.     Hip, knees, full body myalgias, arthralgias, of questionable     etiology, on strong pain medications as she is definitely hurting. 14.Hopefully this is a short hospital stay and hopefully she gets     better real quick but will let her clinical picture dictate her     future course.     [name], MD     JMR/MEDQ  D:  08/14/2010  T:  08/14/2010  Job:  [id number]  cc:   Vania Rea. Jarold Motto, MD, FACG, FACP, FAGA [address] [place] [address]  Electronically Signed by Creola Corn MD on 09/02/2010 10:21:38 PM

## 2010-09-08 ENCOUNTER — Ambulatory Visit (INDEPENDENT_AMBULATORY_CARE_PROVIDER_SITE_OTHER): Payer: Medicare Other | Admitting: Gastroenterology

## 2010-09-08 ENCOUNTER — Encounter: Payer: Self-pay | Admitting: Gastroenterology

## 2010-09-08 VITALS — BP 128/62 | HR 80 | Ht 68.5 in

## 2010-09-08 DIAGNOSIS — R1032 Left lower quadrant pain: Secondary | ICD-10-CM

## 2010-09-08 DIAGNOSIS — Z800 Family history of malignant neoplasm of digestive organs: Secondary | ICD-10-CM

## 2010-09-08 DIAGNOSIS — Z8719 Personal history of other diseases of the digestive system: Secondary | ICD-10-CM

## 2010-09-08 DIAGNOSIS — T50905A Adverse effect of unspecified drugs, medicaments and biological substances, initial encounter: Secondary | ICD-10-CM

## 2010-09-08 DIAGNOSIS — T887XXA Unspecified adverse effect of drug or medicament, initial encounter: Secondary | ICD-10-CM

## 2010-09-08 DIAGNOSIS — K5730 Diverticulosis of large intestine without perforation or abscess without bleeding: Secondary | ICD-10-CM

## 2010-09-08 MED ORDER — RIFAXIMIN 550 MG PO TABS: 550.0000 mg | ORAL_TABLET | Freq: Two times a day (BID) | ORAL | Status: DC

## 2010-09-08 MED ORDER — PHILLIPS COLON HEALTH PO CAPS: 1.0000 | ORAL_CAPSULE | Freq: Every day | ORAL | Status: DC

## 2010-09-08 MED ORDER — MESALAMINE 1.2 G PO TBEC: DELAYED_RELEASE_TABLET | ORAL | Status: DC

## 2010-09-08 NOTE — Progress Notes (Signed)
This is a 70 year old Caucasian female recently hospitalized x2 for recurrent left lower quadrant pain and documented diverticulitis requiring prolonged antibiotic therapy. She subsequently developed a serum sickness-like illness with polyarticular arthralgia,  edema, general malaise, and mild dehydration. She was hospitalized by Dr. Creola Corn and was started all oral prednisone which has alleviated her allergic reaction symptoms. She presently denies lower abdominal discomfort, and is having fairly regular bowel movements without melena or hematochezia. There is no history of fever, chills, arthralgias or skin rashes at this time. Extensive review of the patient's past record shows no evidence of significant prior allergies although she apparently had a penicillin allergy as a child. She currently is planning a trip to [place] on July 4. Of course, she is concerned about relapse with her out of the country travel plans.  Current Medications, Allergies, Past Medical History, Past Surgical History, Family History and Social History were reviewed in [employer] record.  Pertinent Review of Systems Negative   Physical Exam:Awake and alert no acute distress appearing younger than her stated age. Her abdominal exam shows no ocal Exam: rganomegaly, masses, or tenderness. Bowel sounds are normal. I cannot appreciate stigmata of chronic liver disease, swollen joints, edema, or skin rashes. Mental status is normal.    Assessment and Plan: Chronic symptomatic diverticulosis recently treated with prolonged antibiotics, now with suspected segmental colitis, and resolved serum sickness. Because of her travel plans, I placed her oral Lialda 2.4 g a day chronically, Xifaxan 550 mg twice a day throughout her travel time with [facility]  Daily probiotic therapy. She will see me again in 2 months time and we'll discuss future colonoscopy planns. Her last colonoscopy was approximately one  year ago. I also asked her to try to follow a high-fiber diet as best as possible. We'll request hospital records from Dr. Timothy Lasso. I also have warned the patient about possible giardiasis would travel to [place], and we reviewed standard travel safety food and water precautions. No diagnosis found.

## 2010-09-08 NOTE — Patient Instructions (Signed)
Your prescription(s) have been sent to you pharmacy.  Buy the [facility] OTC and take one a day.  Make office visit to come back in 8 weeks.

## 2010-10-27 ENCOUNTER — Ambulatory Visit: Payer: Medicare Other | Admitting: Gastroenterology

## 2010-11-04 ENCOUNTER — Telehealth: Payer: Self-pay | Admitting: Gastroenterology

## 2010-11-04 ENCOUNTER — Emergency Department (HOSPITAL_COMMUNITY)
Admission: EM | Admit: 2010-11-04 | Discharge: 2010-11-04 | Disposition: A | Discharge status: Home / Self Care | Payer: Medicare Other | Source: Home / Self Care | Attending: Emergency Medicine | Admitting: Emergency Medicine

## 2010-11-04 DIAGNOSIS — K219 Gastro-esophageal reflux disease without esophagitis: Secondary | ICD-10-CM | POA: Insufficient documentation

## 2010-11-04 DIAGNOSIS — K5289 Other specified noninfective gastroenteritis and colitis: Secondary | ICD-10-CM | POA: Insufficient documentation

## 2010-11-04 DIAGNOSIS — E039 Hypothyroidism, unspecified: Secondary | ICD-10-CM | POA: Insufficient documentation

## 2010-11-04 DIAGNOSIS — R10819 Abdominal tenderness, unspecified site: Secondary | ICD-10-CM | POA: Insufficient documentation

## 2010-11-04 LAB — URINE MICROSCOPIC-ADD ON

## 2010-11-04 LAB — URINALYSIS, ROUTINE W REFLEX MICROSCOPIC
Bilirubin Urine: NEGATIVE
Glucose, UA: NEGATIVE mg/dL
Hgb urine dipstick: NEGATIVE
Ketones, ur: NEGATIVE mg/dL
Leukocytes, UA: NEGATIVE
Nitrite: NEGATIVE
Protein, ur: >300 mg/dL — AB
Specific Gravity, Urine: 1.02 (ref 1.005–1.030)
Urobilinogen, UA: 0.2 mg/dL (ref 0.0–1.0)
pH: 7.5 (ref 5.0–8.0)

## 2010-11-04 NOTE — Telephone Encounter (Signed)
YES.Marland KitchenMarland KitchenPLEASE HAVE THE PA SEE HER AND CALL ME.Marland KitchenMarland Kitchen

## 2010-11-04 NOTE — Telephone Encounter (Signed)
Informed Amy, Dr Jarold Motto would like for her to check on the pt. Amy stated understanding.

## 2010-11-04 NOTE — Telephone Encounter (Signed)
Pt just called back to report she is going to the ER at [facility]. Dr Jarold Motto, do we need to call Amy or Gunnar Fusi?

## 2010-11-04 NOTE — Telephone Encounter (Signed)
Pt reports she started vomiting midnight last pm until 4:40am with diarrhea and a severe headache. The vomiting started again this am and she can't keep anything down. She reports she ate a shrimp, fish and scallops taco and she thinks she has food poisoning. Pt wants to know if there's anything we can do.

## 2010-11-05 ENCOUNTER — Inpatient Hospital Stay (HOSPITAL_COMMUNITY): Payer: Medicare Other

## 2010-11-05 ENCOUNTER — Inpatient Hospital Stay (HOSPITAL_COMMUNITY)
Admission: AD | Admit: 2010-11-05 | Discharge: 2010-11-09 | DRG: 641 | Disposition: A | Discharge status: Home / Self Care | Payer: Medicare Other | Source: Ambulatory Visit | Attending: Internal Medicine | Admitting: Internal Medicine

## 2010-11-05 ENCOUNTER — Telehealth: Payer: Self-pay | Admitting: *Deleted

## 2010-11-05 ENCOUNTER — Encounter (HOSPITAL_COMMUNITY): Payer: Self-pay | Admitting: Radiology

## 2010-11-05 DIAGNOSIS — I10 Essential (primary) hypertension: Secondary | ICD-10-CM | POA: Diagnosis present

## 2010-11-05 DIAGNOSIS — E860 Dehydration: Principal | ICD-10-CM | POA: Diagnosis present

## 2010-11-05 DIAGNOSIS — E213 Hyperparathyroidism, unspecified: Secondary | ICD-10-CM | POA: Diagnosis present

## 2010-11-05 DIAGNOSIS — E876 Hypokalemia: Secondary | ICD-10-CM | POA: Diagnosis present

## 2010-11-05 DIAGNOSIS — E039 Hypothyroidism, unspecified: Secondary | ICD-10-CM | POA: Diagnosis present

## 2010-11-05 DIAGNOSIS — K219 Gastro-esophageal reflux disease without esophagitis: Secondary | ICD-10-CM | POA: Diagnosis present

## 2010-11-05 LAB — COMPREHENSIVE METABOLIC PANEL
ALT: 30 U/L (ref 0–35)
AST: 36 U/L (ref 0–37)
Albumin: 4.3 g/dL (ref 3.5–5.2)
Alkaline Phosphatase: 35 U/L — ABNORMAL LOW (ref 39–117)
BUN: 21 mg/dL (ref 6–23)
CO2: 26 mEq/L (ref 19–32)
Calcium: 11.3 mg/dL — ABNORMAL HIGH (ref 8.4–10.5)
Chloride: 99 mEq/L (ref 96–112)
Creatinine, Ser: 0.97 mg/dL (ref 0.50–1.10)
GFR calc Af Amer: >60 mL/min (ref >60)
GFR calc non Af Amer: 57 mL/min — ABNORMAL LOW (ref >60)
Glucose, Bld: 90 mg/dL (ref 70–99)
Potassium: 2.7 mEq/L — CL (ref 3.5–5.1)
Sodium: 137 mEq/L (ref 135–145)
Total Bilirubin: 0.4 mg/dL (ref 0.3–1.2)
Total Protein: 7.4 g/dL (ref 6.0–8.3)

## 2010-11-05 LAB — URINALYSIS, ROUTINE W REFLEX MICROSCOPIC
Bilirubin Urine: NEGATIVE
Glucose, UA: NEGATIVE mg/dL
Ketones, ur: NEGATIVE mg/dL
Nitrite: NEGATIVE
Protein, ur: NEGATIVE mg/dL
Specific Gravity, Urine: 1.013 (ref 1.005–1.030)
Urobilinogen, UA: 0.2 mg/dL (ref 0.0–1.0)
pH: 6.5 (ref 5.0–8.0)

## 2010-11-05 LAB — URINE MICROSCOPIC-ADD ON

## 2010-11-05 LAB — CBC
HCT: 40.9 % (ref 36.0–46.0)
Hemoglobin: 14.4 g/dL (ref 12.0–15.0)
MCH: 31.4 pg (ref 26.0–34.0)
MCHC: 35.2 g/dL (ref 30.0–36.0)
MCV: 89.1 fL (ref 78.0–100.0)
Platelets: 349 10*3/uL (ref 150–400)
RBC: 4.59 MIL/uL (ref 3.87–5.11)
RDW: 12.7 % (ref 11.5–15.5)
WBC: 8.8 10*3/uL (ref 4.0–10.5)

## 2010-11-05 LAB — CK: Total CK: 170 U/L (ref 7–177)

## 2010-11-05 LAB — SEDIMENTATION RATE: Sed Rate: 5 mm/hr (ref 0–22)

## 2010-11-05 LAB — TROPONIN I: Troponin I: <0.3 ng/mL (ref <0.30)

## 2010-11-05 LAB — TSH: TSH: 2.032 u[IU]/mL (ref 0.350–4.500)

## 2010-11-05 NOTE — Telephone Encounter (Signed)
Phoned pt for an update on her s&s to determine if Pamela Gip, PA needs to see her. Pt reports her nausea is under control with Zofran. She ate an egg this am and continues to drink only Gatorade. She feels better after hydration yesterday in the ER. She denies diarrhea, has some pain the the "diverticular" area, but states her main problem is headaches. Her BP rose to 200's/100's last pm and she called the on call PCP who ordered a tapering dose of Prednisone. Pt was finally given an appt at Dr Ferd Hibbs office today at 3:45pm. She will call us for an update.

## 2010-11-06 LAB — C-REACTIVE PROTEIN: CRP: 0.3 mg/dL — ABNORMAL LOW (ref <0.6)

## 2010-11-06 LAB — BASIC METABOLIC PANEL
BUN: 22 mg/dL (ref 6–23)
CO2: 25 mEq/L (ref 19–32)
Calcium: 11.1 mg/dL — ABNORMAL HIGH (ref 8.4–10.5)
Chloride: 102 mEq/L (ref 96–112)
Creatinine, Ser: 0.86 mg/dL (ref 0.50–1.10)
GFR calc Af Amer: >60 mL/min (ref >60)
GFR calc non Af Amer: >60 mL/min (ref >60)
Glucose, Bld: 129 mg/dL — ABNORMAL HIGH (ref 70–99)
Potassium: 3.9 mEq/L (ref 3.5–5.1)
Sodium: 137 mEq/L (ref 135–145)

## 2010-11-06 LAB — C3 COMPLEMENT: C3 Complement: 106 mg/dL (ref 90–180)

## 2010-11-06 LAB — C4 COMPLEMENT: Complement C4, Body Fluid: 29 mg/dL (ref 10–40)

## 2010-11-07 ENCOUNTER — Inpatient Hospital Stay (HOSPITAL_COMMUNITY): Payer: Medicare Other

## 2010-11-07 LAB — COMPLEMENT, TOTAL: Compl, Total (CH50): 59 U/mL (ref 31–60)

## 2010-11-08 LAB — COMPREHENSIVE METABOLIC PANEL
ALT: 19 U/L (ref 0–35)
AST: 19 U/L (ref 0–37)
Albumin: 3.7 g/dL (ref 3.5–5.2)
Alkaline Phosphatase: 34 U/L — ABNORMAL LOW (ref 39–117)
BUN: 15 mg/dL (ref 6–23)
CO2: 30 mEq/L (ref 19–32)
Calcium: 10.8 mg/dL — ABNORMAL HIGH (ref 8.4–10.5)
Chloride: 100 mEq/L (ref 96–112)
Creatinine, Ser: 0.76 mg/dL (ref 0.50–1.10)
GFR calc Af Amer: >60 mL/min (ref >60)
GFR calc non Af Amer: >60 mL/min (ref >60)
Glucose, Bld: 78 mg/dL (ref 70–99)
Potassium: 3.6 mEq/L (ref 3.5–5.1)
Sodium: 139 mEq/L (ref 135–145)
Total Bilirubin: 0.5 mg/dL (ref 0.3–1.2)
Total Protein: 6.9 g/dL (ref 6.0–8.3)

## 2010-11-09 LAB — PTH, INTACT AND CALCIUM
Calcium, Total (PTH): 10.5 mg/dL (ref 8.4–10.5)
PTH: 93.7 pg/mL — ABNORMAL HIGH (ref 14.0–72.0)

## 2010-11-10 ENCOUNTER — Ambulatory Visit: Payer: Medicare Other | Admitting: Gastroenterology

## 2010-11-10 LAB — VITAMIN D 1,25 DIHYDROXY
Vitamin D 1, 25 (OH)2 Total: 87 pg/mL — ABNORMAL HIGH (ref 18–72)
Vitamin D2 1, 25 (OH)2: <8 pg/mL
Vitamin D3 1, 25 (OH)2: 87 pg/mL

## 2010-11-11 LAB — UIFE/LIGHT CHAINS/TP QN, 24-HR UR
Albumin, U: DETECTED
Alpha 1, Urine: DETECTED — AB
Alpha 2, Urine: DETECTED — AB
Beta, Urine: DETECTED — AB
Free Kappa Lt Chains,Ur: 1.44 mg/dL (ref 0.14–2.42)
Free Kappa/Lambda Ratio: 11.08 ratio — ABNORMAL HIGH (ref 2.04–10.37)
Free Lambda Lt Chains,Ur: 0.13 mg/dL (ref 0.02–0.67)
Gamma Globulin, Urine: DETECTED — AB
Total Protein, Urine: 3.3 mg/dL

## 2010-11-11 LAB — PROTEIN ELECTROPHORESIS, SERUM
Albumin ELP: 61.3 % (ref 55.8–66.1)
Alpha-1-Globulin: 4.7 % (ref 2.9–4.9)
Alpha-2-Globulin: 13.2 % — ABNORMAL HIGH (ref 7.1–11.8)
Beta 2: 4 % (ref 3.2–6.5)
Beta Globulin: 5.2 % (ref 4.7–7.2)
Gamma Globulin: 11.6 % (ref 11.1–18.8)
M-Spike, %: NOT DETECTED g/dL
Total Protein ELP: 6.5 g/dL (ref 6.0–8.3)

## 2010-11-12 ENCOUNTER — Other Ambulatory Visit (HOSPITAL_COMMUNITY): Payer: Self-pay | Admitting: Internal Medicine

## 2010-11-12 LAB — CULTURE, BLOOD (ROUTINE X 2)
Culture  Setup Time: 201208030134
Culture  Setup Time: 201208030134
Culture: NO GROWTH
Culture: NO GROWTH

## 2010-11-13 LAB — CATECHOLAMINES, FRACTIONATED, URINE, 24 HOUR
Catecholamines T: 22 mcg/24 h — ABNORMAL LOW (ref 26–121)
Creatinine, Urine mg/day-CATEUR: 0.87 g/(24.h) (ref 0.63–2.50)
Dopamine 24 Hr Urine: 75 mcg/24 h (ref 52–480)
Norepinephrine 24 Hr Urine: 22 mcg/24 h (ref 15–100)
Total urine volume: 2500 mL

## 2010-11-13 LAB — METANEPHRINES, PLASMA
Metanephrine, Free: 25 pg/mL (ref <=57)
Normetanephrine, Free: 50 pg/mL (ref <=148)
Total Metanephrines-Plasma: 75 pg/mL (ref <=205)

## 2010-11-15 LAB — METANEPHRINES, URINE, 24 HOUR
Metaneph Total, Ur: 324 mcg/24 h (ref 224–832)
Metanephrines, Ur: 107 mcg/24 h (ref 90–315)
Normetanephrine, 24H Ur: 217 mcg/24 h (ref 122–676)
Volume, Urine-METAN: 2500 mL

## 2010-11-17 ENCOUNTER — Encounter (HOSPITAL_COMMUNITY)
Admission: RE | Admit: 2010-11-17 | Discharge: 2010-11-17 | Disposition: A | Discharge status: Home / Self Care | Payer: Medicare Other | Source: Ambulatory Visit | Attending: Internal Medicine | Admitting: Internal Medicine

## 2010-11-17 DIAGNOSIS — E213 Hyperparathyroidism, unspecified: Secondary | ICD-10-CM | POA: Insufficient documentation

## 2010-11-17 MED ORDER — TECHNETIUM TC 99M SESTAMIBI - CARDIOLITE
20.0000 | Freq: Once | INTRAVENOUS | Status: AC | PRN
  Administered 2010-11-17: 09:00:00 27.3 via INTRAVENOUS

## 2010-11-20 ENCOUNTER — Encounter (HOSPITAL_COMMUNITY): Payer: Medicare Other

## 2010-11-30 ENCOUNTER — Other Ambulatory Visit (HOSPITAL_COMMUNITY): Payer: Self-pay | Admitting: Neurology

## 2010-11-30 DIAGNOSIS — D3500 Benign neoplasm of unspecified adrenal gland: Secondary | ICD-10-CM

## 2010-12-01 ENCOUNTER — Other Ambulatory Visit (HOSPITAL_COMMUNITY): Payer: Self-pay | Admitting: Neurology

## 2010-12-01 ENCOUNTER — Other Ambulatory Visit: Payer: Self-pay | Admitting: Neurology

## 2010-12-01 DIAGNOSIS — R51 Headache: Secondary | ICD-10-CM

## 2010-12-02 ENCOUNTER — Ambulatory Visit (INDEPENDENT_AMBULATORY_CARE_PROVIDER_SITE_OTHER): Payer: Medicare Other | Admitting: Surgery

## 2010-12-02 ENCOUNTER — Encounter (INDEPENDENT_AMBULATORY_CARE_PROVIDER_SITE_OTHER): Payer: Self-pay | Admitting: Surgery

## 2010-12-02 ENCOUNTER — Ambulatory Visit
Admission: RE | Admit: 2010-12-02 | Discharge: 2010-12-02 | Disposition: A | Discharge status: Home / Self Care | Payer: 59 | Source: Ambulatory Visit | Attending: Neurology | Admitting: Neurology

## 2010-12-02 VITALS — BP 108/72 | HR 70 | Temp 96.7°F | Ht 68.5 in | Wt 172.6 lb

## 2010-12-02 DIAGNOSIS — E210 Primary hyperparathyroidism: Secondary | ICD-10-CM

## 2010-12-02 DIAGNOSIS — R51 Headache: Secondary | ICD-10-CM

## 2010-12-02 MED ORDER — GADOBENATE DIMEGLUMINE 529 MG/ML IV SOLN
15.0000 mL | Freq: Once | INTRAVENOUS | Status: AC | PRN
  Administered 2010-12-02: 15 mL via INTRAVENOUS

## 2010-12-02 NOTE — Progress Notes (Signed)
Chief Complaint  Patient presents with  . Parathyroid Adenoma    primary hyperparathyroidism - referral from Dr. Timothy Lasso    HISTORY: Patient is a 70 year old female seen at the request of her primary care physician for suspected primary hyperparathyroidism. Patient was admitted to the hospital in early August for unrelenting nausea, vomiting, and headache. She was noted to have significant hypertension. A medical and neurological evaluation is ongoing. Laboratory studies in early August demonstrated an elevated intact PTH level of 93. Calcium levels have ranged from 10.5-10.7. However a repeat intact PTH level was normal at 31. Patient subsequently underwent nuclear medicine parathyroid scanning which localized an abnormal parathyroid to the left superior position consistent with a parathyroid adenoma. Patient is now referred for consideration for minimally invasive parathyroidectomy.  Patient has had no prior head or neck surgery. She does have hypothyroidism and has taken thyroid hormone replacement for years. There is no other history of endocrinopathy and the patient or her family. She has had bone density scanning which is normal. She has not had a 24-hour urine collection for calcium. She did have a vitamin D level which was slightly low at 27.  Past Medical History  Diagnosis Date  . Hiatal hernia   . Diverticulosis   . Constipation   . GERD (gastroesophageal reflux disease)   . Arthritis   . Hypertension   . Generalized headaches   . 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 Weakness      Current Outpatient Prescriptions  Medication Sig Dispense Refill  . ALPRAZolam (XANAX) 0.5 MG tablet Take 1 tablet (0.5 mg total) by mouth 3 (three) times daily as needed for sleep or anxiety.  90 tablet  0  . escitalopram (LEXAPRO) 10 MG tablet Take 10 mg by mouth daily.        Marland Kitchen esomeprazole (NEXIUM) 40 MG capsule Take 40 mg by mouth daily before breakfast.        . Eszopiclone (LUNESTA PO) Take by mouth as  needed. Patient alternates between this and the Ambien in usage.       Marland Kitchen levothyroxine (SYNTHROID, LEVOTHROID) 50 MCG tablet Take 50 mcg by mouth daily.        Marland Kitchen lisinopril (PRINIVIL,ZESTRIL) 10 MG tablet Take 10 mg by mouth daily.        . polyethylene glycol powder (GLYCOLAX/MIRALAX) powder Take 17 g by mouth daily. Nightly       . Probiotic Product (PHILLIPS COLON HEALTH) CAPS Take 1 capsule by mouth daily.  30 capsule  0  . verapamil (CALAN) 120 MG tablet Take 120 mg by mouth 2 (two) times daily.        Marland Kitchen zolpidem (AMBIEN) 5 MG tablet Take 5 mg by mouth as needed.        Marland Kitchen CALCIUM-VITAMIN D PO Take by mouth.        . mesalamine (LIALDA) 1.2 G EC tablet Take two tablets by mouth once a day  60 tablet  3  . rifaximin (XIFAXAN) 550 MG TABS Take 1 tablet (550 mg total) by mouth 2 (two) times daily.  60 tablet  0     Allergies  Allergen Reactions  . Penicillins Hives and Other (See Comments)    Thrush     Family History  Problem Relation Age of Onset  . Colon cancer Mother     and sister   . Cancer Mother     colon  . Heart disease Mother     congestive heart failure  .  Colon cancer Sister   . Cancer Sister     colon  . Heart disease Father     heart attack following surgery     History   Social History  . Marital Status: Married    Spouse Name: N/A    Number of Children: 3  . Years of Education: N/A   Occupational History  . retired    Social History Main Topics  . Smoking status: Former Games developer  . Smokeless tobacco: Never Used  . Alcohol Use: Yes     weekends   . Drug Use: No  . Sexually Active: None   Other Topics Concern  . None   Social History Narrative  . None     REVIEW OF SYSTEMS - PERTINENT POSITIVES ONLY: Patient notes persistent headaches daily requiring narcotic analgesics area patient notes occasional nausea. Patient notes persistent hypertension. Patient does complain of significant fatigue. She denies nephrolithiasis.   EXAM: Filed  Vitals:   12/02/10 1032  BP: 108/72  Pulse: 70  Temp: 96.7 F (35.9 C)    HEENT: normocephalic; pupils equal and reactive; sclerae clear; dentition good; mucous membranes moist NECK:  ; symmetric on extension; no palpable anterior or posterior cervical lymphadenopathy; no supraclavicular masses; no tenderness CHEST: clear to auscultation bilaterally without rales, rhonchi, or wheezes CARDIAC: regular rate and rhythm without significant murmur; peripheral pulses are full EXT:  non-tender without edema; no deformity NEURO: no gross focal deficits; no sign of tremor   LABORATORY RESULTS: See E-Chart for most recent results   RADIOLOGY RESULTS: See E-Chart or I-Site for most recent results   IMPRESSION: Hypercalcemia, 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   PLAN: Patient is currently undergoing a neurological evaluation. Several studies are scheduled in the next week. We will await the results of these studies before proceeding with any surgical intervention.  I would like to obtain a 24-hour urine collection for calcium. That will be done this week.  Patient does have a low vitamin D level, and I will request of her primary physician initiate appropriate treatment.  The patient and her husband and I had a lengthy discussion regarding all of the above issues. We discussed minimally invasive parathyroidectomy. We discussed potential complications, specifically the risk of recurrent laryngeal nerve injury with alteration in voice quality. We discussed performing this as an outpatient procedure. We discussed the possibility of a second gland adenoma and the potential need for additional surgery. They understand and wish to schedule her thyroid surgery at some point in the near future once her neurologic assessment is complete.  The risks and benefits of the procedure have been discussed at length with the patient.  The patient understands the  proposed procedure, potential alternative treatments, and the course of recovery to be expected.  All of the patient's questions have been answered at this time.  The patient wishes to proceed with surgery and will schedule a date for their procedure through our office staff.   Velora Heckler, MD, FACS General & Endocrine Surgery [facility]      Visit Diagnoses: 1. Hyperparathyroidism, primary     Primary Care Physician: [name], MD

## 2010-12-07 NOTE — H&P (Signed)
NAMEMarland Kitchen  Pamela, Snow NO.:  [id number]  MEDICAL RECORD NO.:  [id number]  LOCATION:  4709                         FACILITY:  [facility]  PHYSICIAN:  [name], MD       DATE OF BIRTH:  02-01-1941  DATE OF ADMISSION:  11/05/2010 DATE OF DISCHARGE:                             HISTORY & PHYSICAL   PRIMARY CARE PROVIDER:  [name], MD.  PRIMARY GASTROENTEROLOGIST:  Vania Rea. Jarold Motto, MD, Clementeen Graham, FACP, FAGA.  CHIEF COMPLAINT:  Severe rigors, hypertension, headache, nausea, vomiting, and diarrhea.  HISTORY OF PRESENT ILLNESS:  This is a 70 year old female here seen today in the office for sick walk-in visit and will be admitted directly for further evaluation and treatment.  She was well until Tuesday of this week when she went out with her husband for dinner, had fish, came back, and immediately started feeling a little poor.  Around 10 p.m. at night had nausea, vomiting, and frequent bowel movements and frequent urination.  She also developed a headache.  She got sick all night until the morning.  She called in and we told her going to the emergency room. She was dehydrated and was given 2 liters of normal saline and also given Zofran, Toradol, and morphine.  Urinalysis was obtained and was negative.  No further labs or CT scanning or ancillary studies were done.  They were felt not needed and since she was feeling better, she was treated and sent home.  After some of the medicines in the emergency wore off, she developed a headache.  It became severe and she called my partner, Dr. Ivery Quale overnight and called in some prednisone. The prednisone was taken and she felt immediately better.  As the prednisone wore off, the headache came back.  She also took Xanax overnight.  She has had some confusion today with reports of having trouble turning the TV on, trouble using remote controls, and other things that are kind of way of the usual for her.  She is  here today in the office with severe rigors and chills and symptoms of fever, although she is afebrile.  The other things that are of importance is again the chills, the headache, the anorexia, the fatigue, heartburn and urinary frequency.  PAST MEDICAL HISTORY:  Includes: 1. Acute serum sickness in May 2012. 2. History of mild anemia. 3. Status post thrush in May 2012 admission. 4. Osteoarthritis of hands and wrists. 5. History of depression. 6. History of insomnia. 7. History of constipation. 8. History of GERD with esophageal stricture. 9. History of anxiety. 10.History of hypothyroidism. 11.History of B12 deficiency. 12.History of diverticulosis with diverticulitis issue back in April     2012.  SURGICAL HISTORY: 1. Total abdominal hysterectomy, bilateral salpingo-oophorectomy in     2002. 2. Tonsil and adenoidectomy. 3. History of broken elbow. 4. Basal cell carcinoma of the nose that has been removed. 5. Right total knee surgery.  FAMILY HISTORY:  Colon cancer, coronary artery disease, peripheral vascular disease, and longevity.  SOCIAL HISTORY:  Married, three children, four grandchildren.  [employer] quality scientist.  She does not smoke and drinks a glass a wine with dinner.  Denies  illicit drugs.  REVIEW OF SYSTEMS:  Otherwise negative except for stated.  Complete review of systems was done and can be found on the written H and P.  MEDICATION:  List includes: 1. Alprazolam 0.25 mg 1 tablet t.i.d. p.r.n. 2. Calcium 600 mg by mouth twice daily. 3. Lexapro 10 mg p.o. daily. 4. Multivitamin 1 p.o. daily. 5. Nexium 40 daily. 6. Synthroid 50 mcg 1 tablet as directed. 7. Nascobal 500 mcg per 0.1 mL nasal solution, 2.3 mL once weekly. 8. Lunesta 3 mg by mouth at bedtime p.r.n.  ALLERGIES:  Include PENICILLIN, CLINDAMYCIN and possibly CIPRO.  EKG shows sinus tachycardia, PVCs, and no ischemia.  PHYSICAL EXAMINATION:  VITAL SIGNS:  Weight is 170 pounds, height  67.5 inches, temperature 98.1, pulse 72, blood pressure 190/100 up to 218/108.  GENERAL:  Alert and oriented.  No meningeal signs.  Eyes are open.  She is interacting appropriately, but intermittently has these severe shaking rigors. HEENT:  Eyes:  PERRL.  Oropharynx is dry. NECK:  No JVD.  No lymphadenopathy. RESPIRATORY:  Clear to auscultation bilaterally.  No rales, rhonchi, or wheezing. CARDIOVASCULAR:  Regular without murmurs, gallops, or rubs. GI:  Soft, nontender, nondistended.  Bowel sounds are positive.  Left lower quadrant is completely benign.  She has got abdominal scars noted. MUSCULOSKELETAL:  She is able to stand up, actually standing up and putting her feet on the ground helps with the rigors.  She is not diffusely weak.  She actually looks fairly good again between episodes. NEUROLOGIC:  Mental status is currently intact.  Reflexes are intact. Cranial nerves II through XII is grossly intact.  There is no evidence of any stroke.  The tongue protrudes midline and again there is no meningeal signs.  ASSESSMENT AND PLAN:  This is a 70 year old female here with a strange cluster of symptoms including elevated blood pressure, recent dehydration, nausea and vomiting after fish, severe rigors, severe chills, and a severe headache. 1. Dehydration.  We will go ahead and give IV fluids. 2. For the rigors, nausea, vomiting, headache, we will reevaluate for     possible serum sickness again.  This may be a milder attack,     although she does not have edema or polyarthralgias at this time.     We will admit to rule out sepsis. 3. We will check labs. 4. We will continue steroids. 5. We will continue prophylactic medicines based on her symptoms. 6. We will withdraw unnecessary medications, if any. 7. We will follow up on blood cultures. 8. We will recheck her urinalysis. 9. We will recheck an EKG and we will do a broader search for any     underlying infections.  Infectious  Disease or Neurology may need to     be consulted if felt to be necessary. 10.For the elevated blood pressure, hydralazine p.r.n. has been     ordered and we will need lower blood pressure at this time. 11.We will check a cranial CT to make sure no bleed or stroke has     happened, although no clinical symptoms at this point. 12.No diverticular disease noted. 13.For hypothyroidism, we will check a TSH. 14.Squeezers for deep venous thrombosis prophylaxis. 15.Hold antibiotics unless we know what exactly we are treating. 16.Expect her white count to be elevated due to the fact that she had     some steroids.  At this current time, she is currently enigma, she     needs to be in the hospital.  Clearly something bad could be going     on and hopefully the workup will pick up exactly what is going on,     so she can be treated appropriately.     [name], MD     JMR/MEDQ  D:  11/05/2010  T:  11/06/2010  Job:  [id number]  cc:   Vania Rea. Jarold Motto, MD, Caleen Essex, FAGA  Electronically Signed by Creola Corn MD on 12/07/2010 09:47:37 AM

## 2010-12-08 ENCOUNTER — Ambulatory Visit (HOSPITAL_COMMUNITY)
Admission: RE | Admit: 2010-12-08 | Discharge: 2010-12-08 | Disposition: A | Discharge status: Home / Self Care | Payer: Medicare Other | Source: Ambulatory Visit | Attending: Neurology | Admitting: Neurology

## 2010-12-08 DIAGNOSIS — R51 Headache: Secondary | ICD-10-CM | POA: Insufficient documentation

## 2010-12-08 LAB — CSF CELL COUNT WITH DIFFERENTIAL
RBC Count, CSF: 1 /mm3 — ABNORMAL HIGH
Tube #: 3
WBC, CSF: 1 /mm3 (ref 0–5)

## 2010-12-08 LAB — CRYPTOCOCCAL ANTIGEN, CSF: Crypto Ag: NEGATIVE

## 2010-12-08 LAB — PROTEIN AND GLUCOSE, CSF
Glucose, CSF: 59 mg/dL (ref 43–76)
Total  Protein, CSF: 48 mg/dL — ABNORMAL HIGH (ref 15–45)

## 2010-12-15 ENCOUNTER — Ambulatory Visit (HOSPITAL_COMMUNITY): Payer: Medicare Other

## 2010-12-16 ENCOUNTER — Other Ambulatory Visit (HOSPITAL_COMMUNITY): Payer: 59

## 2010-12-16 ENCOUNTER — Encounter (HOSPITAL_COMMUNITY): Payer: Medicare Other

## 2010-12-16 ENCOUNTER — Telehealth (INDEPENDENT_AMBULATORY_CARE_PROVIDER_SITE_OTHER): Payer: Self-pay

## 2010-12-16 NOTE — Telephone Encounter (Signed)
Patient would like try an Rx for Vit. D to increase her energy level. Is the recommendation for Vit D over-the-counter? If not will you prescribe?

## 2010-12-17 ENCOUNTER — Encounter (HOSPITAL_COMMUNITY): Payer: Medicare Other

## 2010-12-21 ENCOUNTER — Ambulatory Visit (INDEPENDENT_AMBULATORY_CARE_PROVIDER_SITE_OTHER): Payer: Medicare Other | Admitting: Surgery

## 2010-12-21 NOTE — Telephone Encounter (Signed)
Patient may buy Vit D over the counter.  Any good vitamin brand.  Dose is 2000 units daily. TMG

## 2010-12-22 ENCOUNTER — Ambulatory Visit (HOSPITAL_COMMUNITY): Admission: RE | Admit: 2010-12-22 | Payer: 59 | Source: Ambulatory Visit | Admitting: Surgery

## 2010-12-23 LAB — BASIC METABOLIC PANEL
BUN: 10
CO2: 28
Calcium: 9.3
Chloride: 104
Creatinine, Ser: 0.97
GFR calc Af Amer: >60
GFR calc non Af Amer: 58 — ABNORMAL LOW
Glucose, Bld: 103 — ABNORMAL HIGH
Potassium: 3.3 — ABNORMAL LOW
Sodium: 137

## 2010-12-23 LAB — CBC
HCT: 29 — ABNORMAL LOW
Hemoglobin: 10 — ABNORMAL LOW
MCHC: 34.5
MCV: 91.7
Platelets: 233
RBC: 3.17 — ABNORMAL LOW
RDW: 12.4
WBC: 6.3

## 2010-12-27 NOTE — Discharge Summary (Addendum)
NAME:  Pamela Snow, Pamela Snow NO.:  [id number]  MEDICAL RECORD NO.:  [id number]  LOCATION:                               FACILITY:  [facility]  PHYSICIAN:  [name], MD       DATE OF BIRTH:  [birth year]  DATE OF ADMISSION:  11/05/2010 DATE OF DISCHARGE:  11/09/2010                              DISCHARGE SUMMARY   PRIMARY CARE PROVIDER:  [name], MD  NEUROLOGIST:  Genene Churn. Love, MD  DISCHARGE DIAGNOSES: 1. Hypertensive urgency. 2. Hypertension. 3. Headache. 4. Hypercalcemia. 5. Hypokalemia. 6. Question mastoiditis. 7. Prodrome of rigors, severe hypertension, headache, nausea,     vomiting, and diarrhea. 8. Acute serum sickness in May 2012. 9. History of mild anemia. 10.History of thrush in May 2012 admission. 11.Osteoarthritis of hands and wrist. 12.History depression. 13.History of insomnia. 14.History of constipation. 15.History of gastroesophageal reflux disease with esophageal     stricture. 16.History of anxiety. 17.History of hypothyroidism. 18.History of B12 deficiency. 19.History of diverticulosis with diverticulitis history back in April     2012. 20.Total abdominal hysterectomy, bilateral salpingo-oophorectomy in     2002. 21.Tonsil and adenoidectomy. 22.History of broken elbow. 23.Basal cell carcinoma of the nose has been removed. 24.Right total knee surgery.  DISCHARGE MEDICATIONS: 1. Doxycycline 100 mg b.i.d. 2. Vicodin 5/325 every 4 hours as needed for pain. 3. Verapamil 120 mg SR q.a.m. and verapamil 240 mg SR at bedtime. 4. Lexapro 10 mg p.o. daily. 5. Lunesta 1 mg p.o. at bedtime. 6. MiraLax 17 grams in 8 ounces of water daily p.r.n. for     constipation. 7. Nexium 40 mg p.o. daily 8. Probiotic over-the-counter while on antibiotics. 9. Synthroid 50 mcg p.o. daily. 10.Xanax 0.5 mg t.i.d. p.r.n. anxiety.  DISCHARGE PROCEDURES: 1. Cranial CT done August 2 revealing no acute intracranial     abnormality. 2. Chest x-ray done  August 2 revealing no acute cardiopulmonary     disease. 3. KUB on August 2 revealing nonobstructive bowel gas pattern.  MRI     with MRA on August 4 revealing no acute infarcts, minimal white     matter type changes consistent with resultant small vessel disease,     minimal partial opacification of the mastoid air cells and minimal     mucosal thickening of the ethmoid sinus air cells with the MRA     showing mild branch vessel atherosclerotic type changes,     nonvisualization of left PICA and both AICAs.  No aneurysms were     noted. 4. Ultrasound of the renal and urinary tract shows a 1.5 cm left upper     pole cyst, otherwise no masses or hydronephrosis seen. 5. Medical management. 6. Multiple lab evaluations.  HISTORY OF PRESENT ILLNESS:  Pamela Snow is a 70 year old woman who presented to medical attention to my office in November 05, 2010 and was directly admitted for further evaluation and treatment.  She was well until Tuesday of the week of admission.  When she went out with her husband for dinner, they ate fish.  She came back to her house and  immediately started to feel poor.  At 10:00 p.m. night she had  nausea, vomiting, frequent bowel movements, and frequent urination.  She developed a severe headache.  She got sick all night into the morning. She called in the following morning and we told her to go to the emergency room.  There, she was found to be dehydrated and given 2 liters of normal saline and also given Zofran, Toradol, and morphine.  Urinalysis was obtained and was negative.  No further labs or CT scans or ancillary studies were done.  She was felt to be better and she was sent home in stable condition.  After being at home and the medicines from the emergency room wore off, the headache came back worse.  It got to be severe and she called the person on call Dr. Eloise Harman and was called in some prednisone.  She took the prednisone and felt immediately  better but as the prednisone wore off, the headache came back.  She also took Xanax.  The next day she had confusion and altered mental state for her. She eventually showed to my office, was seen, and she had severe rigors, chills, symptoms of fever, although temperature was afebrile, headache, anorexia, fatigue, heartburn, urinary frequency, and blood pressure was up to 190/100.  It was felt that she was sick and she was directly admitted for further evaluation and treatment.  HOSPITAL COURSE:  Pamela Snow was admitted through my office on August 2.  She is a 70 year old woman with a strange cluster of symptoms including elevated blood pressure, recent dehydration, nausea and vomiting issues after fish, now with severe rigors, severe chills, severe headache, and multiple other weird symptoms.  She was felt to be dehydrated and IV fluids were started once she got to the hospital.  For the rigors, nausea, vomiting, headache, labs were checked for followup of whether she had a recurrence of serum sickness, the results came back negative.  She did not have edema or polyarthralgias at the time of admission.  Cultures were obtained and labs were obtained which ruled out sepsis and septicemia.  We continued the steroids.  Medicines were not felt to be the cause of her cluster of symptoms.  EKG and ancillary studies were done and ancillary studies are listed above.  Consideration of Infectious Disease or Neurology consultation was broached but felt not to be necessary as the treatments we provided have helped.  For her elevated blood pressures, hydralazine was started and adjustments were made in her medication list.  Squeezers were used for DVT prophylaxis.  The labs that were done in the hospital included a normal white count, normal hemoglobin, normal platelet count.  Sed rate was 5.  Kidney function was fine.  Electrolytes were fine except for one element of hypokalemia which was  replaced.  She had 1 elevated blood sugar.  Liver tests were considered normal.  Plasma metanephrines were done.  These were normal.  SPEP showed a nonspecific increase in the alpha 2 region but was otherwise negative.  CK and troponin I were negative.  TSH was 2.032.  PTH was elevated at 93.7 with a calcium at that time 10.5. Other calcium to the hospital were higher.  Complement levels were done and more appropriate.  C-reactive protein was low.  A 125 hydroxy vitamin D was 87 and a little high.  A 24-hour urine VMA and metanephrines were negative.  There was no monoclonal free light chains detected.  The urine did show polyclonal increase in the free kappa and/or lambda light chains.  Urinalysis was negative  for UTI.  Cultures came back negative and the highest calcium obtained in the hospital was 11.3.  Over the course of the hospital stay the workup was done and was negative and they were all thrilled about that.  By November 09, 2010, her blood pressure is better.  She continued to have these headaches, low- grade headaches.  They were livable.  She did not have any meningeal signs.  Vicodin and pain medication were helpful.  Her blood pressures had dropped to 120/74 up to 133/75.  Her labs looked fine.  Her physical exam was unremarkable and workup is all listed above.  For her moderate new significant hypertension better on current medication.  Her headache was still there but bearable.  Her hypercalcemia did reveal that PTH intact was elevated, vitamin D was elevated.  SPEP and UPEP was negative and she will get an outpatient workup.  Her hypokalemia was better and for the question mastoiditis seen on the MRI, she is going to be treated with a short course of antibiotics.  As far as disposition, it was determined on November 09, 2010 that she was in stable condition and if she was able to get up, walk, shower, and take care of herself at home, we are going to discharge her home in  stable condition.  She met that criteria and went home later that day.     [name], MD     JMR/MEDQ  D:  12/23/2010  T:  12/23/2010  Job:  [id number]  cc:   [name], MD Genene Churn. Love, M.D. Velora Heckler, MD  Electronically Signed by Creola Corn MD on 01/03/2011 09:41:56 AM

## 2011-01-07 ENCOUNTER — Other Ambulatory Visit (INDEPENDENT_AMBULATORY_CARE_PROVIDER_SITE_OTHER): Payer: Self-pay | Admitting: Surgery

## 2011-01-08 LAB — CBC
HCT: 30.8 — ABNORMAL LOW
HCT: 40.7
Hemoglobin: 10.8 — ABNORMAL LOW
Hemoglobin: 14
MCHC: 34.5
MCHC: 34.9
MCV: 91.9
MCV: 92
Platelets: 261
Platelets: 318
RBC: 3.35 — ABNORMAL LOW
RBC: 4.43
RDW: 12.8
RDW: 12.8
WBC: 5.1
WBC: 6.7

## 2011-01-08 LAB — BASIC METABOLIC PANEL
BUN: 16
BUN: 20
CO2: 29
CO2: 30
Calcium: 10.7 — ABNORMAL HIGH
Calcium: 9.1
Chloride: 104
Chloride: 108
Creatinine, Ser: 0.95
Creatinine, Ser: 1.02
GFR calc Af Amer: >60
GFR calc Af Amer: >60
GFR calc non Af Amer: 55 — ABNORMAL LOW
GFR calc non Af Amer: 59 — ABNORMAL LOW
Glucose, Bld: 101 — ABNORMAL HIGH
Glucose, Bld: 113 — ABNORMAL HIGH
Potassium: 4.1
Potassium: 4.6
Sodium: 138
Sodium: 146 — ABNORMAL HIGH

## 2011-01-08 LAB — URINALYSIS, ROUTINE W REFLEX MICROSCOPIC
Bilirubin Urine: NEGATIVE
Glucose, UA: NEGATIVE
Hgb urine dipstick: NEGATIVE
Ketones, ur: NEGATIVE
Nitrite: NEGATIVE
Protein, ur: NEGATIVE
Specific Gravity, Urine: 1.007
Urobilinogen, UA: 0.2
pH: 6.5

## 2011-01-08 LAB — TYPE AND SCREEN
ABO/RH(D): A POS
Antibody Screen: NEGATIVE

## 2011-01-08 LAB — CALCIUM, URINE, 24 HOUR
Calcium, 24 hour urine: 126 mg/d (ref 100–250)
Calcium, Ur: 4 mg/dL

## 2011-01-08 LAB — DIFFERENTIAL
Basophils Absolute: 0
Basophils Relative: 0
Eosinophils Absolute: 0.2
Eosinophils Relative: 4
Lymphocytes Relative: 30
Lymphs Abs: 1.5
Monocytes Absolute: 0.5
Monocytes Relative: 10
Neutro Abs: 2.9
Neutrophils Relative %: 56

## 2011-01-08 LAB — PROTIME-INR
INR: 0.9
Prothrombin Time: 12.1

## 2011-01-08 LAB — APTT: aPTT: 36

## 2011-02-09 ENCOUNTER — Telehealth (INDEPENDENT_AMBULATORY_CARE_PROVIDER_SITE_OTHER): Payer: Self-pay

## 2011-02-09 NOTE — Telephone Encounter (Signed)
Dr. Timothy Lasso called to check on if Pamela Snow could be seen in the next two weeks for follow up. When I last talked with Pamela Snow she was ready to schedule surgery. I left a message stating that I will check with Dr. Gerrit Friends tomorrow to see what the next step will truly be.

## 2011-02-10 ENCOUNTER — Telehealth (INDEPENDENT_AMBULATORY_CARE_PROVIDER_SITE_OTHER): Payer: Self-pay | Admitting: Surgery

## 2011-02-10 ENCOUNTER — Other Ambulatory Visit (INDEPENDENT_AMBULATORY_CARE_PROVIDER_SITE_OTHER): Payer: Self-pay | Admitting: Surgery

## 2011-02-10 DIAGNOSIS — E210 Primary hyperparathyroidism: Secondary | ICD-10-CM

## 2011-02-10 NOTE — H&P (Signed)
 Progress Notes   Pamela Snow (MR# [id number])       Progress Notes     Chief Complaint   Patient presents with   .  Parathyroid Adenoma       primary hyperparathyroidism - referral from Dr. Russo        HISTORY: Patient is a 70-year-old female seen at the request of her primary care physician for suspected primary hyperparathyroidism. Patient was admitted to the hospital in early August for unrelenting nausea, vomiting, and headache. She was noted to have significant hypertension. A medical and neurological evaluation is ongoing. Laboratory studies in early August demonstrated an elevated intact PTH level of 93. Calcium levels have ranged from 10.5-10.7. However a repeat intact PTH level was normal at 31. Patient subsequently underwent nuclear medicine parathyroid scanning which localized an abnormal parathyroid to the left superior position consistent with a parathyroid adenoma. Patient is now referred for consideration for minimally invasive parathyroidectomy.   Patient has had no prior head or neck surgery. She does have hypothyroidism and has taken thyroid hormone replacement for years. There is no other history of endocrinopathy and the patient or her family. She has had bone density scanning which is normal. She has not had a 24-hour urine collection for calcium. She did have a vitamin D level which was slightly low at 27.    Past Medical History   Diagnosis  Date   .  Hiatal hernia     .  Diverticulosis     .  Constipation     .  GERD (gastroesophageal reflux disease)     .  Arthritis     .  Hypertension     .  Generalized headaches     .  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  Weakness             Current Outpatient Prescriptions   Medication  Sig  Dispense  Refill   .  ALPRAZolam (XANAX) 0.5 MG tablet  Take 1 tablet (0.5 mg total) by mouth 3 (three) times daily as needed for sleep or anxiety.   90 tablet   0   .  escitalopram (LEXAPRO) 10 MG tablet  Take 10 mg by mouth  daily.           .  esomeprazole (NEXIUM) 40 MG capsule  Take 40 mg by mouth daily before breakfast.           .  Eszopiclone (LUNESTA PO)  Take by mouth as needed. Patient alternates between this and the Ambien in usage.          .  levothyroxine (SYNTHROID, LEVOTHROID) 50 MCG tablet  Take 50 mcg by mouth daily.           .  lisinopril (PRINIVIL,ZESTRIL) 10 MG tablet  Take 10 mg by mouth daily.           .  polyethylene glycol powder (GLYCOLAX/MIRALAX) powder  Take 17 g by mouth daily. Nightly          .  Probiotic Product (PHILLIPS COLON HEALTH) CAPS  Take 1 capsule by mouth daily.   30 capsule   0   .  verapamil (CALAN) 120 MG tablet  Take 120 mg by mouth 2 (two) times daily.           .  zolpidem (AMBIEN) 5 MG tablet  Take 5 mg by mouth as needed.           .    CALCIUM-VITAMIN D PO  Take by mouth.           .  mesalamine (LIALDA) 1.2 G EC tablet  Take two tablets by mouth once a day   60 tablet   3   .  rifaximin (XIFAXAN) 550 MG TABS  Take 1 tablet (550 mg total) by mouth 2 (two) times daily.   60 tablet   0           Allergies   Allergen  Reactions   .  Penicillins  Hives and Other (See Comments)       Thrush           Family History   Problem  Relation  Age of Onset   .  Colon cancer  Mother         and sister    .  Cancer  Mother         colon   .  Heart disease  Mother         congestive heart failure   .  Colon cancer  Sister     .  Cancer  Sister         colon   .  Heart disease  Father         heart attack following surgery           History       Social History   .  Marital Status:  Married       Spouse Name:  N/A       Number of Children:  3   .  Years of Education:  N/A       Occupational History   .  retired         Social History Main Topics   .  Smoking status:  Former Smoker   .  Smokeless tobacco:  Never Used   .  Alcohol Use:  Yes         weekends    .  Drug Use:  No   .  Sexually Active:  None       Other Topics  Concern   .   None       Social History Narrative   .  None          REVIEW OF SYSTEMS - PERTINENT POSITIVES ONLY: Patient notes persistent headaches daily requiring narcotic analgesics area patient notes occasional nausea. Patient notes persistent hypertension. Patient does complain of significant fatigue. She denies nephrolithiasis.     EXAM: Filed Vitals:     12/02/10 1032   BP:  108/72   Pulse:  70   Temp:  96.7 F (35.9 C)        HEENT:           normocephalic; pupils equal and reactive; sclerae clear; dentition good; mucous membranes moist NECK:             ; symmetric on extension; no palpable anterior or posterior cervical lymphadenopathy; no supraclavicular masses; no tenderness CHEST:           clear to auscultation bilaterally without rales, rhonchi, or wheezes CARDIAC:       regular rate and rhythm without significant murmur; peripheral pulses are full EXT:                non-tender without edema; no deformity NEURO:          no gross focal deficits; no sign of tremor       LABORATORY RESULTS: See E-Chart for most recent results     RADIOLOGY RESULTS: See E-Chart or I-Site for most recent results     IMPRESSION: Hypercalcemia, 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     PLAN: Patient is currently undergoing a neurological evaluation. Several studies are scheduled in the next week. We will await the results of these studies before proceeding with any surgical intervention.   I would like to obtain a 24-hour urine collection for calcium. That will be done this week.   Patient does have a low vitamin D level, and I will request of her primary physician initiate appropriate treatment.   The patient and her husband and I had a lengthy discussion regarding all of the above issues. We discussed minimally invasive parathyroidectomy. We discussed potential complications, specifically the risk of recurrent laryngeal nerve  injury with alteration in voice quality. We discussed performing this as an outpatient procedure. We discussed the possibility of a second gland adenoma and the potential need for additional surgery. They understand and wish to schedule her thyroid surgery at some point in the near future once her neurologic assessment is complete.   The risks and benefits of the procedure have been discussed at length with the patient.  The patient understands the proposed procedure, potential alternative treatments, and the course of recovery to be expected.  All of the patient's questions have been answered at this time.  The patient wishes to proceed with surgery and will schedule a date for their procedure through our office staff.     Oma Alpert M. Mckinzey Entwistle, MD, FACS General & Endocrine Surgery [facility]           Visit Diagnoses: 1.  Hyperparathyroidism, primary         Primary Care Physician: RUSSO,JOHN M, MD           Progress Notes Info       Author Note Status Last Update User Last Update Date/Time    Venba Zenner M Yadir Zentner, MD Signed Marbella Markgraf M Fadi Menter, MD 12/02/2010 11:35 AM       

## 2011-02-10 NOTE — Telephone Encounter (Signed)
Have discussed case with Dr. Timothy Lasso.  All studies are completed.  Patient has completed her neuro evaluation.  Will call to schedule parathyroid surgery.  tmg

## 2011-02-17 ENCOUNTER — Telehealth: Payer: Self-pay | Admitting: *Deleted

## 2011-02-17 ENCOUNTER — Encounter: Payer: Self-pay | Admitting: Gastroenterology

## 2011-02-17 ENCOUNTER — Ambulatory Visit (INDEPENDENT_AMBULATORY_CARE_PROVIDER_SITE_OTHER): Payer: 59 | Admitting: Gastroenterology

## 2011-02-17 ENCOUNTER — Encounter (HOSPITAL_COMMUNITY): Payer: Self-pay | Admitting: *Deleted

## 2011-02-17 ENCOUNTER — Inpatient Hospital Stay (HOSPITAL_COMMUNITY)
Admission: AD | Admit: 2011-02-17 | Discharge: 2011-02-21 | DRG: 392 | Disposition: A | Discharge status: Home / Self Care | Payer: Medicare Other | Source: Ambulatory Visit | Attending: Internal Medicine | Admitting: Internal Medicine

## 2011-02-17 ENCOUNTER — Observation Stay (HOSPITAL_COMMUNITY): Payer: Medicare Other

## 2011-02-17 VITALS — BP 102/66 | HR 72 | Ht 68.0 in | Wt 161.4 lb

## 2011-02-17 DIAGNOSIS — K5732 Diverticulitis of large intestine without perforation or abscess without bleeding: Secondary | ICD-10-CM | POA: Insufficient documentation

## 2011-02-17 DIAGNOSIS — D649 Anemia, unspecified: Secondary | ICD-10-CM | POA: Diagnosis present

## 2011-02-17 DIAGNOSIS — K5792 Diverticulitis of intestine, part unspecified, without perforation or abscess without bleeding: Secondary | ICD-10-CM

## 2011-02-17 DIAGNOSIS — F411 Generalized anxiety disorder: Secondary | ICD-10-CM | POA: Diagnosis present

## 2011-02-17 DIAGNOSIS — F329 Major depressive disorder, single episode, unspecified: Secondary | ICD-10-CM | POA: Diagnosis present

## 2011-02-17 DIAGNOSIS — R51 Headache: Secondary | ICD-10-CM | POA: Diagnosis present

## 2011-02-17 DIAGNOSIS — E876 Hypokalemia: Secondary | ICD-10-CM | POA: Diagnosis present

## 2011-02-17 DIAGNOSIS — K219 Gastro-esophageal reflux disease without esophagitis: Secondary | ICD-10-CM | POA: Diagnosis present

## 2011-02-17 DIAGNOSIS — B370 Candidal stomatitis: Secondary | ICD-10-CM | POA: Diagnosis present

## 2011-02-17 DIAGNOSIS — F3289 Other specified depressive episodes: Secondary | ICD-10-CM | POA: Diagnosis present

## 2011-02-17 DIAGNOSIS — I10 Essential (primary) hypertension: Secondary | ICD-10-CM | POA: Diagnosis present

## 2011-02-17 DIAGNOSIS — E039 Hypothyroidism, unspecified: Secondary | ICD-10-CM | POA: Diagnosis present

## 2011-02-17 DIAGNOSIS — E210 Primary hyperparathyroidism: Secondary | ICD-10-CM | POA: Diagnosis present

## 2011-02-17 DIAGNOSIS — E559 Vitamin D deficiency, unspecified: Secondary | ICD-10-CM | POA: Diagnosis present

## 2011-02-17 LAB — CBC
HCT: 34.6 % — ABNORMAL LOW (ref 36.0–46.0)
Hemoglobin: 11.9 g/dL — ABNORMAL LOW (ref 12.0–15.0)
MCH: 31.2 pg (ref 26.0–34.0)
MCHC: 34.4 g/dL (ref 30.0–36.0)
MCV: 90.6 fL (ref 78.0–100.0)
Platelets: 274 10*3/uL (ref 150–400)
RBC: 3.82 MIL/uL — ABNORMAL LOW (ref 3.87–5.11)
RDW: 12.9 % (ref 11.5–15.5)
WBC: 5.5 10*3/uL (ref 4.0–10.5)

## 2011-02-17 LAB — COMPREHENSIVE METABOLIC PANEL
ALT: 14 U/L (ref 0–35)
AST: 15 U/L (ref 0–37)
Albumin: 3.6 g/dL (ref 3.5–5.2)
Alkaline Phosphatase: 30 U/L — ABNORMAL LOW (ref 39–117)
BUN: 15 mg/dL (ref 6–23)
CO2: 23 mEq/L (ref 19–32)
Calcium: 10.8 mg/dL — ABNORMAL HIGH (ref 8.4–10.5)
Chloride: 105 mEq/L (ref 96–112)
Creatinine, Ser: 0.93 mg/dL (ref 0.50–1.10)
GFR calc Af Amer: 72 mL/min — ABNORMAL LOW (ref >90)
GFR calc non Af Amer: 62 mL/min — ABNORMAL LOW (ref >90)
Glucose, Bld: 88 mg/dL (ref 70–99)
Potassium: 3.4 mEq/L — ABNORMAL LOW (ref 3.5–5.1)
Sodium: 138 mEq/L (ref 135–145)
Total Bilirubin: 0.4 mg/dL (ref 0.3–1.2)
Total Protein: 6.9 g/dL (ref 6.0–8.3)

## 2011-02-17 LAB — PROTIME-INR
INR: 1 (ref 0.00–1.49)
Prothrombin Time: 13.4 seconds (ref 11.6–15.2)

## 2011-02-17 LAB — URINALYSIS, ROUTINE W REFLEX MICROSCOPIC
Bilirubin Urine: NEGATIVE
Glucose, UA: NEGATIVE mg/dL
Hgb urine dipstick: NEGATIVE
Ketones, ur: NEGATIVE mg/dL
Leukocytes, UA: NEGATIVE
Nitrite: NEGATIVE
Protein, ur: NEGATIVE mg/dL
Specific Gravity, Urine: 1.007 (ref 1.005–1.030)
Urobilinogen, UA: 1 mg/dL (ref 0.0–1.0)
pH: 6.5 (ref 5.0–8.0)

## 2011-02-17 LAB — DIFFERENTIAL
Basophils Absolute: 0 10*3/uL (ref 0.0–0.1)
Basophils Relative: 1 % (ref 0–1)
Eosinophils Absolute: 0.1 10*3/uL (ref 0.0–0.7)
Eosinophils Relative: 3 % (ref 0–5)
Lymphocytes Relative: 24 % (ref 12–46)
Lymphs Abs: 1.3 10*3/uL (ref 0.7–4.0)
Monocytes Absolute: 0.6 10*3/uL (ref 0.1–1.0)
Monocytes Relative: 11 % (ref 3–12)
Neutro Abs: 3.4 10*3/uL (ref 1.7–7.7)
Neutrophils Relative %: 62 % (ref 43–77)

## 2011-02-17 LAB — APTT: aPTT: 39 seconds — ABNORMAL HIGH (ref 24–37)

## 2011-02-17 MED ORDER — ONDANSETRON HCL 4 MG/2ML IJ SOLN
4.0000 mg | Freq: Four times a day (QID) | INTRAMUSCULAR | Status: DC | PRN
  Administered 2011-02-18: 4 mg via INTRAVENOUS
  Filled 2011-02-17 (×2): qty 2

## 2011-02-17 MED ORDER — PANTOPRAZOLE SODIUM 40 MG PO PACK
40.0000 mg | PACK | Freq: Every day | ORAL | Status: DC
  Administered 2011-02-18: 40 mg via ORAL
  Filled 2011-02-17 (×5): qty 20

## 2011-02-17 MED ORDER — TOPIRAMATE 25 MG PO TABS
25.0000 mg | ORAL_TABLET | Freq: Every day | ORAL | Status: DC
  Administered 2011-02-17: 25 mg via ORAL
  Filled 2011-02-17 (×2): qty 1

## 2011-02-17 MED ORDER — ESCITALOPRAM OXALATE 10 MG PO TABS
10.0000 mg | ORAL_TABLET | Freq: Every day | ORAL | Status: DC
  Administered 2011-02-18 – 2011-02-21 (×4): 10 mg via ORAL
  Filled 2011-02-17 (×6): qty 1

## 2011-02-17 MED ORDER — ZOLPIDEM TARTRATE 5 MG PO TABS: 5.0000 mg | ORAL_TABLET | Freq: Every evening | ORAL | Status: DC | PRN

## 2011-02-17 MED ORDER — ONDANSETRON HCL 4 MG PO TABS: 4.0000 mg | ORAL_TABLET | Freq: Four times a day (QID) | ORAL | Status: DC | PRN

## 2011-02-17 MED ORDER — KCL IN DEXTROSE-NACL 20-5-0.45 MEQ/L-%-% IV SOLN: INTRAVENOUS | Status: DC

## 2011-02-17 MED ORDER — LEVOTHYROXINE SODIUM 50 MCG PO TABS
50.0000 ug | ORAL_TABLET | Freq: Every day | ORAL | Status: DC
  Administered 2011-02-18 – 2011-02-21 (×4): 50 ug via ORAL
  Filled 2011-02-17 (×6): qty 1

## 2011-02-17 MED ORDER — HYDROMORPHONE HCL PF 1 MG/ML IJ SOLN
1.0000 mg | INTRAMUSCULAR | Status: DC | PRN
  Administered 2011-02-17 – 2011-02-18 (×4): 1 mg via INTRAVENOUS
  Filled 2011-02-17 (×4): qty 1

## 2011-02-17 MED ORDER — AZTREONAM 1 G IJ SOLR
1.0000 g | Freq: Three times a day (TID) | INTRAMUSCULAR | Status: DC
  Filled 2011-02-17 (×2): qty 1

## 2011-02-17 MED ORDER — VERAPAMIL HCL 120 MG PO TABS
120.0000 mg | ORAL_TABLET | Freq: Two times a day (BID) | ORAL | Status: DC
  Administered 2011-02-17: 120 mg via ORAL
  Filled 2011-02-17 (×3): qty 1

## 2011-02-17 MED ORDER — DEXTROSE 5 % IV SOLN
1.0000 g | Freq: Three times a day (TID) | INTRAVENOUS | Status: DC
  Administered 2011-02-17 – 2011-02-21 (×12): 1 g via INTRAVENOUS
  Filled 2011-02-17 (×14): qty 1

## 2011-02-17 MED ORDER — ENOXAPARIN SODIUM 40 MG/0.4ML ~~LOC~~ SOLN
40.0000 mg | SUBCUTANEOUS | Status: DC
  Administered 2011-02-17 – 2011-02-20 (×4): 40 mg via SUBCUTANEOUS
  Filled 2011-02-17 (×6): qty 0.4

## 2011-02-17 MED ORDER — ALPRAZOLAM 0.5 MG PO TABS
0.5000 mg | ORAL_TABLET | Freq: Three times a day (TID) | ORAL | Status: DC | PRN
  Administered 2011-02-19 – 2011-02-20 (×2): 0.5 mg via ORAL
  Filled 2011-02-17 (×2): qty 1

## 2011-02-17 MED ORDER — LISINOPRIL 10 MG PO TABS
10.0000 mg | ORAL_TABLET | Freq: Every day | ORAL | Status: DC
  Administered 2011-02-18 – 2011-02-21 (×4): 10 mg via ORAL
  Filled 2011-02-17 (×5): qty 1

## 2011-02-17 MED ORDER — IOHEXOL 300 MG/ML  SOLN
100.0000 mL | Freq: Once | INTRAMUSCULAR | Status: AC | PRN
  Administered 2011-02-17: 100 mL via INTRAVENOUS

## 2011-02-17 NOTE — Telephone Encounter (Signed)
Pt called Dr Jarold Motto reporting lower abdominal pain; thinks it's Diverticulitis. Dr Jarold Motto is in [facility] today, and Dr Arlyce Dice agreed to see the pt at 2:15 today.

## 2011-02-17 NOTE — H&P (Signed)
Impression #1 probable acute diverticulitis Patient symptoms are reminiscent of her prior episodes of diverticulitis. It is noteworthy that this would be her third episode in 6 months. Recommendations #1 begin aztreonam 1 g IV every 8 hours #2 CT of the abdomen and pelvis #3 would consider elective resection of the sigmoid colon at a later time if she in fact has recurrent diverticulitis  #2 history of serum sickness - to Cipro and Flagyl  #3 probable parathyroid adenoma Workup per Dr. Gerrit Friends

## 2011-02-17 NOTE — Patient Instructions (Signed)
You are being admitted to [facility]  You will need to go on over to [facility] to the Admitting office as soon as you leave our office

## 2011-02-17 NOTE — H&P (Signed)
History of Present Illness: [name] is returned as an emergency work in because of abdominal pain. She has a history of at least 2 episodes of acute diverticulitis within the past 6 months. The latter episode was complicated by a serum sickness-type reaction for which she was put on steroids. She was seen in June, 2012 for what  appeared to be a segmental colitis. In the last 4 days she has developed increasing lower bowel pain. It has intensified in the past 36 hours. She's had fevers to 101. She complains of constipation.    Past Medical History Diagnosis Date . Hiatal hernia  . Diverticulosis  . Constipation  . GERD (gastroesophageal reflux disease)  . Arthritis  . Hypertension  . Generalized headaches  . Hearing loss on left  . Weakness   Past Surgical History Procedure Date . Knee surgery 2005   bilateral  . Abdominal hysterectomy 2002  family history includes Cancer in her mother and sister; Colon cancer in her mother and sister; and Heart disease in her father and mother. Current Outpatient Prescriptions Medication Sig Dispense Refill . ALPRAZolam (XANAX) 0.5 MG tablet Take 1 tablet (0.5 mg total) by mouth 3 (three) times daily as needed for sleep or anxiety.  90 tablet  0 . escitalopram (LEXAPRO) 10 MG tablet Take 10 mg by mouth daily.       Marland Kitchen esomeprazole (NEXIUM) 40 MG capsule Take 40 mg by mouth daily before breakfast.       . levothyroxine (SYNTHROID, LEVOTHROID) 50 MCG tablet Take 50 mcg by mouth daily.       Marland Kitchen lisinopril (PRINIVIL,ZESTRIL) 10 MG tablet Take 10 mg by mouth daily.       . polyethylene glycol powder (GLYCOLAX/MIRALAX) powder Take 17 g by mouth daily. Nightly      . Probiotic Product (PHILLIPS COLON HEALTH) CAPS Take 1 capsule by mouth daily.  30 capsule  0 . topiramate (TOPAMAX) 25 MG capsule Take 25 mg by mouth. Take 3 every night      . verapamil (CALAN) 120 MG tablet Take 120 mg by mouth 2 (two) times daily.       Marland Kitchen zolpidem (AMBIEN) 5 MG  tablet Take 5 mg by mouth as needed.        Allergies as of 02/17/2011 - Review Complete 02/17/2011 Allergen Reaction Noted . Penicillins Hives and Other (See Comments)  . Ciprofloxacin  02/17/2011 . Flagyl (metronidazole hcl)  02/17/2011   reports that she quit smoking about 10 years ago. She has never used smokeless tobacco. She reports that she drinks alcohol. She reports that she does not use illicit drugs.     Review of Systems: Pertinent positive and negative review of systems were noted in the above HPI section. All other review of systems were otherwise negative.  Vital signs were reviewed in today's medical record Physical Exam: General: She is a well-appearing female Head: Normocephalic and atraumatic Eyes:  sclerae anicteric, EOMI Ears: Normal auditory acuity Mouth: No deformity or lesions Neck: Supple, no masses or thyromegaly Lungs: Clear throughout to auscultation Heart: Regular rate and rhythm; no murmurs, rubs or bruits Abdomen: Soft, as diffuse abdominal tenderness, especially bilaterally in the lower contents. There is mild guarding and slight rebound. Rectal:deferred Musculoskeletal: Symmetrical with no gross deformities  Skin: No lesions on visible extremities Pulses:  Normal pulses noted Extremities: No clubbing, cyanosis, edema or deformities noted Neurological: Alert oriented x 4, grossly nonfocal Cervical Nodes:  No significant cervical adenopathy Inguinal Nodes: No significant  inguinal adenopathy Psychological:  Alert and cooperative. Normal mood and affect

## 2011-02-17 NOTE — Progress Notes (Signed)
History of Present Illness: [name] has returned as an emergency work in because of abdominal pain. She has a history of at least 2 episodes of acute diverticulitis within the past 6 months. The latter episode was complicated by a serum sickness-type reaction for which she was put on steroids. She was seen in June, 2012 for what  appeared to be a segmental colitis. In the last 4 days she has developed increasing lower bowel pain. It has intensified in the past 36 hours. She's had fevers to 101. She complains of constipation.    Past Medical History  Diagnosis Date  . Hiatal hernia   . Diverticulosis   . Constipation   . GERD (gastroesophageal reflux disease)   . Arthritis   . Hypertension   . Generalized headaches   . 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 Weakness    Past Surgical History  Procedure Date  . Knee surgery 2005    bilateral   . Abdominal hysterectomy 2002   family history includes Cancer in her mother and sister; Colon cancer in her mother and sister; and Heart disease in her father and mother. Current Outpatient Prescriptions  Medication Sig Dispense Refill  . ALPRAZolam (XANAX) 0.5 MG tablet Take 1 tablet (0.5 mg total) by mouth 3 (three) times daily as needed for sleep or anxiety.  90 tablet  0  . escitalopram (LEXAPRO) 10 MG tablet Take 10 mg by mouth daily.        Marland Kitchen esomeprazole (NEXIUM) 40 MG capsule Take 40 mg by mouth daily before breakfast.        . levothyroxine (SYNTHROID, LEVOTHROID) 50 MCG tablet Take 50 mcg by mouth daily.        Marland Kitchen lisinopril (PRINIVIL,ZESTRIL) 10 MG tablet Take 10 mg by mouth daily.        . polyethylene glycol powder (GLYCOLAX/MIRALAX) powder Take 17 g by mouth daily. Nightly       . Probiotic Product (PHILLIPS COLON HEALTH) CAPS Take 1 capsule by mouth daily.  30 capsule  0  . topiramate (TOPAMAX) 25 MG capsule Take 25 mg by mouth. Take 3 every night       . verapamil (CALAN) 120 MG tablet Take 120 mg by mouth 2 (two) times daily.        Marland Kitchen  zolpidem (AMBIEN) 5 MG tablet Take 5 mg by mouth as needed.         Allergies as of 02/17/2011 - Review Complete 02/17/2011  Allergen Reaction Noted  . Penicillins Hives and Other (See Comments)   . Ciprofloxacin  02/17/2011  . Flagyl (metronidazole hcl)  02/17/2011    reports that she quit smoking about 10 years ago. She has never used smokeless tobacco. She reports that she drinks alcohol. She reports that she does not use illicit drugs.     Review of Systems: Pertinent positive and negative review of systems were noted in the above HPI section. All other review of systems were otherwise negative.  Vital signs were reviewed in today's medical record Physical Exam: General: She is a well-appearing female Head: Normocephalic and atraumatic Eyes:  sclerae anicteric, EOMI Ears: Normal auditory acuity Mouth: No deformity or lesions Neck: Supple, no masses or thyromegaly Lungs: Clear throughout to auscultation Heart: Regular rate and rhythm; no murmurs, rubs or bruits Abdomen: Soft,  diffuse abdominal tenderness, especially bilaterally in the lower quadrants.  There is mild guarding and slight rebound. Rectal:deferred Musculoskeletal: Symmetrical with no gross deformities  Skin: No lesions  on visible extremities Pulses:  Normal pulses noted Extremities: No clubbing, cyanosis, edema or deformities noted Neurological: Alert oriented x 4, grossly nonfocal Cervical Nodes:  No significant cervical adenopathy Inguinal Nodes: No significant inguinal adenopathy Psychological:  Alert and cooperative. Normal mood and affect  Impression-probable recurrent acute diverticulitis  Plan-admission for IV antibiotics and pain control

## 2011-02-17 NOTE — Progress Notes (Signed)
History of Present Illness: Pamela Snow is returned as an emergency work in because of abdominal pain. She has a history of at least 2 episodes of acute diverticulitis within the past 6 months. The latter episode was complicated by a serum sickness-type reaction for which she was put on steroids. She was seen in June, 2012 for what  appeared to be a segmental colitis. In the last 4 days she has developed increasing lower bowel pain. It has intensified in the past 36 hours. She's had fevers to 101. She complains of constipation.    Past Medical History  Diagnosis Date  . Hiatal hernia   . Diverticulosis   . Constipation   . GERD (gastroesophageal reflux disease)   . Arthritis   . Hypertension   . Generalized headaches   . 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 Weakness    Past Surgical History  Procedure Date  . Knee surgery 2005    bilateral   . Abdominal hysterectomy 2002   family history includes Cancer in her mother and sister; Colon cancer in her mother and sister; and Heart disease in her father and mother. Current Outpatient Prescriptions  Medication Sig Dispense Refill  . ALPRAZolam (XANAX) 0.5 MG tablet Take 1 tablet (0.5 mg total) by mouth 3 (three) times daily as needed for sleep or anxiety.  90 tablet  0  . escitalopram (LEXAPRO) 10 MG tablet Take 10 mg by mouth daily.        Marland Kitchen esomeprazole (NEXIUM) 40 MG capsule Take 40 mg by mouth daily before breakfast.        . levothyroxine (SYNTHROID, LEVOTHROID) 50 MCG tablet Take 50 mcg by mouth daily.        Marland Kitchen lisinopril (PRINIVIL,ZESTRIL) 10 MG tablet Take 10 mg by mouth daily.        . polyethylene glycol powder (GLYCOLAX/MIRALAX) powder Take 17 g by mouth daily. Nightly       . Probiotic Product (PHILLIPS COLON HEALTH) CAPS Take 1 capsule by mouth daily.  30 capsule  0  . verapamil (CALAN) 120 MG tablet Take 120 mg by mouth 2 (two) times daily.        Marland Kitchen zolpidem (AMBIEN) 5 MG tablet Take 5 mg by mouth as needed.         Allergies as of  02/17/2011 - Review Complete 02/17/2011  Allergen Reaction Noted  . Penicillins Hives and Other (See Comments)   . Ciprofloxacin  02/17/2011  . Flagyl (metronidazole hcl)  02/17/2011    reports that she quit smoking about 10 years ago. She has never used smokeless tobacco. She reports that she drinks alcohol. She reports that she does not use illicit drugs.     Review of Systems: Pertinent positive and negative review of systems were noted in the above HPI section. All other review of systems were otherwise negative.  Vital signs were reviewed in today's medical record Physical Exam: General: She is a well-appearing female Head: Normocephalic and atraumatic Eyes:  sclerae anicteric, EOMI Ears: Normal auditory acuity Mouth: No deformity or lesions Neck: Supple, no masses or thyromegaly Lungs: Clear throughout to auscultation Heart: Regular rate and rhythm; no murmurs, rubs or bruits Abdomen: Soft, as diffuse abdominal tenderness, especially bilaterally in the lower contents. There is mild guarding and slight rebound. Rectal:deferred Musculoskeletal: Symmetrical with no gross deformities  Skin: No lesions on visible extremities Pulses:  Normal pulses noted Extremities: No clubbing, cyanosis, edema or deformities noted Neurological: Alert oriented x 4, grossly  nonfocal Cervical Nodes:  No significant cervical adenopathy Inguinal Nodes: No significant inguinal adenopathy Psychological:  Alert and cooperative. Normal mood and affect  Impression-probable recurrent acute diverticulitis  Plan-admission for IV antibiotics and pain control

## 2011-02-18 ENCOUNTER — Telehealth (INDEPENDENT_AMBULATORY_CARE_PROVIDER_SITE_OTHER): Payer: Self-pay | Admitting: General Surgery

## 2011-02-18 DIAGNOSIS — K5732 Diverticulitis of large intestine without perforation or abscess without bleeding: Secondary | ICD-10-CM

## 2011-02-18 LAB — BASIC METABOLIC PANEL
BUN: 13 mg/dL (ref 6–23)
CO2: 24 mEq/L (ref 19–32)
Calcium: 10.5 mg/dL (ref 8.4–10.5)
Chloride: 105 mEq/L (ref 96–112)
Creatinine, Ser: 0.89 mg/dL (ref 0.50–1.10)
GFR calc Af Amer: 75 mL/min — ABNORMAL LOW (ref >90)
GFR calc non Af Amer: 65 mL/min — ABNORMAL LOW (ref >90)
Glucose, Bld: 95 mg/dL (ref 70–99)
Potassium: 3.9 mEq/L (ref 3.5–5.1)
Sodium: 138 mEq/L (ref 135–145)

## 2011-02-18 LAB — CBC
HCT: 35.1 % — ABNORMAL LOW (ref 36.0–46.0)
Hemoglobin: 11.6 g/dL — ABNORMAL LOW (ref 12.0–15.0)
MCH: 30.5 pg (ref 26.0–34.0)
MCHC: 33 g/dL (ref 30.0–36.0)
MCV: 92.4 fL (ref 78.0–100.0)
Platelets: 263 10*3/uL (ref 150–400)
RBC: 3.8 MIL/uL — ABNORMAL LOW (ref 3.87–5.11)
RDW: 12.8 % (ref 11.5–15.5)
WBC: 6.9 10*3/uL (ref 4.0–10.5)

## 2011-02-18 MED ORDER — VERAPAMIL HCL 80 MG PO TABS
80.0000 mg | ORAL_TABLET | Freq: Two times a day (BID) | ORAL | Status: DC
  Administered 2011-02-18 – 2011-02-21 (×7): 80 mg via ORAL
  Filled 2011-02-18 (×10): qty 1

## 2011-02-18 MED ORDER — ONDANSETRON HCL 4 MG/2ML IJ SOLN
4.0000 mg | INTRAMUSCULAR | Status: DC | PRN
  Administered 2011-02-18 – 2011-02-19 (×5): 4 mg via INTRAVENOUS
  Filled 2011-02-18 (×6): qty 2

## 2011-02-18 MED ORDER — HYDROMORPHONE HCL PF 2 MG/ML IJ SOLN
1.0000 mg | INTRAMUSCULAR | Status: DC | PRN
  Administered 2011-02-18: 1.5 mg via INTRAVENOUS
  Administered 2011-02-18 (×2): 1 mg via INTRAVENOUS
  Filled 2011-02-18 (×3): qty 1

## 2011-02-18 MED ORDER — POTASSIUM CHLORIDE CRYS ER 20 MEQ PO TBCR
20.0000 meq | EXTENDED_RELEASE_TABLET | Freq: Once | ORAL | Status: AC
  Administered 2011-02-18: 20 meq via ORAL
  Filled 2011-02-18: qty 1

## 2011-02-18 MED ORDER — CIPROFLOXACIN IN D5W 400 MG/200ML IV SOLN: 400.0000 mg | INTRAVENOUS | Status: DC

## 2011-02-18 MED ORDER — ONDANSETRON HCL 4 MG/2ML IJ SOLN
4.0000 mg | Freq: Once | INTRAMUSCULAR | Status: AC
  Administered 2011-02-18: 4 mg via INTRAVENOUS

## 2011-02-18 MED ORDER — TOPIRAMATE 25 MG PO TABS
75.0000 mg | ORAL_TABLET | Freq: Every day | ORAL | Status: DC
  Administered 2011-02-18 – 2011-02-20 (×3): 75 mg via ORAL
  Filled 2011-02-18 (×5): qty 3

## 2011-02-18 NOTE — Progress Notes (Signed)
Chart reviewed and UR completed. 

## 2011-02-18 NOTE — Progress Notes (Signed)
She feels a bit better now.  Will continue Aztreonam for diverticulitis - will need to think about acceptable po medication given allergies. Anticipate 3-5 days in hospital, at least.

## 2011-02-18 NOTE — Progress Notes (Signed)
Subjective: 40 Female admitted yesterday for Diverticulitis and placed on Cipro/Flagyl/IV ABx. She has been seeing Dr Sandria Manly for [facility] and Dr Gerrit Friends for a documented parathyroid adenoma/Hyperparathyroidism. Her HAs have been much better on Topamax per Dr Sandria Manly. She has had issues wth Hypercalcemia and HTN - although we have been weaning her BP meds and she is tolerating nicely. She also has been losing weight as an outpt.   Past History  Past Medical History (reviewed - no changes required): Depression, edema (peripheral), GERD (stricture), hypothyroidism, insomnia, diverticulosis/Diverticulitis, severe sinusitis with HA, DDD, bilateral knee arthritis  Hypercalcemia/Elevated PTHi/Elevated BP - HTN Urgency admission.  HA's - topamax.  hyperparathyroidism  Diverticulitis 07/2010  Serum Sickness issue 07/2010 Admission  Surgical History (reviewed - no changes required): TAH/BSO (2002), T&A, broken elbow, BCC on nose (removed), (R) TKA    Objective: Vital signs in last 24 hours: Temp:  [97.3 F (36.3 C)-98.4 F (36.9 C)] 98.4 F (36.9 C) (11/14 2133) Pulse Rate:  [60-72] 60  (11/14 2133) Resp:  [18-20] 18  (11/14 2133) BP: (102-136)/(66-82) 125/68 mmHg (11/14 2133) SpO2:  [98 %] 98 % (11/14 2133) Weight:  [73.11 kg (161 lb 2.9 oz)-73.211 kg (161 lb 6.4 oz)] 161 lb 2.9 oz (73.11 kg) (11/14 1645) Weight change:  Last BM Date: 02/17/11  CBG (last 3)  No results found for this basename: GLUCAP:3 in the last 72 hours  Intake/Output from previous day: 11/14 0701 - 11/15 0700 In: 50 [IV Piggyback:50] Out: -  Intake/Output this shift: Total I/O In: 50 [IV Piggyback:50] Out: -   General appearance: alert, cooperative and appears stated age Eyes: no scleral icterus Throat: oropharynx moist without erythema Resp: clear to auscultation bilaterally Cardio: regular rate and rhythm, S1, S2 normal, no murmur, click, rub or gallop Extremities: no clubbing, cyanosis or edema Ab - soft but tender.   No Rebound or guarding.  Lab Results:  [facility] 02/18/11 0503 02/17/11 1739  NA 138 138  K 3.9 3.4*  CL 105 105  CO2 24 23  GLUCOSE 95 88  BUN 13 15  CREATININE 0.89 0.93  CALCIUM 10.5 10.8*  MG -- --  PHOS -- --    Basename 02/17/11 1739  AST 15  ALT 14  ALKPHOS 30*  BILITOT 0.4  PROT 6.9  ALBUMIN 3.6    Basename 02/18/11 0503 02/17/11 1739  WBC 6.9 5.5  NEUTROABS -- 3.4  HGB 11.6* 11.9*  HCT 35.1* 34.6*  MCV 92.4 90.6  PLT 263 274   No results found for this basename: CKTOTAL:3,CKMB:3,CKMBINDEX:3,TROPONINI:3 in the last 72 hours No results found for this basename: TSH,T4TOTAL,FREET3,T3FREE,THYROIDAB in the last 72 hours No results found for this basename: VITAMINB12:2,FOLATE:2,FERRITIN:2,TIBC:2,IRON:2,RETICCTPCT:2 in the last 72 hours  Studies/Results: Ct Abdomen Pelvis W Contrast  02/17/2011  *RADIOLOGY REPORT*  Clinical Data: Sudden onset of pelvic pain.  History of diverticulitis.  CT ABDOMEN AND PELVIS WITH CONTRAST  Technique:  Multidetector CT imaging of the abdomen and pelvis was performed following the standard protocol during bolus administration of intravenous contrast.  Contrast: OMNIPAQUE IOHEXOL 300 MG/ML IV SOLN  Comparison: 07/23/2010  Findings: Lung bases are clear.  No pleural or pericardial fluid. The liver appears normal without focal lesions or biliary ductal dilatation.  No calcified gallstones.  The spleen is normal.  The pancreas is normal.  The adrenal glands are normal. The right kidney is normal except for a 5 mm cyst in the lower pole.  The left kidney is normal except for a 13  mm cyst in the upper pole. There is mild atherosclerotic change of the aorta but no aneurysm. The IVC is normal.  No retroperitoneal mass or adenopathy.  The appendix is positioned in the right upper quadrant to mid abdomen ( mobile cecum) and does not appear inflamed.  There is chronic diverticulosis of the distal descending and sigmoid colon.  There is acute  diverticulitis with moderate inflammatory changes in the peri colic fat.  No evidence of abscess.  Findings are more pronounced than were seen in 2022-08-02.  IMPRESSION: Acute diverticulitis in the sigmoid colon region, more advanced than was seen in Aug 02, 2022.  Pericolic inflammatory changes without discrete abscess formation.  Original Report Authenticated By: Thomasenia Sales, M.D.     Medications: Scheduled:   . aztreonam  1 g Intravenous Q8H  . enoxaparin  40 mg Subcutaneous Q24H  . escitalopram  10 mg Oral Daily  . levothyroxine  50 mcg Oral Daily  . lisinopril  10 mg Oral Daily  . pantoprazole sodium  40 mg Oral Daily  . topiramate  25 mg Oral Daily  . verapamil  120 mg Oral BID  . DISCONTD: aztreonam  1 g Intravenous Q8H  . DISCONTD: ciprofloxacin  400 mg Intravenous 120 min pre-op   Continuous:   . DISCONTD: dextrose 5 % and 0.45 % NaCl with KCl 20 mEq/L      Assessment/Plan: Active Problems:  Diverticulitis large intestine  Problem # 1: Diverticulitis - Now on Aztreonam.  WBC is fine.  Consider surgical consult prn. Plan per GI.  Ct showed: Acute diverticulitis in the sigmoid colon region, more advanced than was seen in 08-02-2022.  Pericolic inflammatory changes without discrete abscess formation. 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  The labs have normalized over time but we are left with an abnormal scan.  She has seen Dr Gerrit Friends as recently as 11/7 and were planning minimally invasive Parathyroidecomy.  Calcium up on admit.  Problem # 4: HYPERCALCEMIA (ICD-275.42)  For parathyroidectomy in future.   Problem # 5: ELEVATED BLOOD PRESSURE (ICD-796.2)  We have been weaning meds.  Isoptin Sr 120 Mg Cr-tabs (Verapamil hcl) .Marland Kitchen... 1 po qam and 1 po qhs - I  will lower dose to 80 mg PO BID Lisinopril 10 Mg Tabs (Lisinopril) .Marland Kitchen... 1/2 po daily   Problem # 6: Anxiety/Depression- Xanax/Lexapro   Problem #7 DVT Prohylaxis   Problem #8: GERD - Nexium   Problem #9 Hypothyroid on Synthroid   Problem #10 Vit D Deficiency- OK for Replacement   Problem 11 - hypokalemia -replete  Problem 12 - Anemia secondary to current illness - Follow.  Complete OutPt Medication List:  1) Alprazolam 0.25 Mg Tabs (Alprazolam) .Marland Kitchen.. 1 po tid prn  2) Lexapro 10 Mg Tabs (Escitalopram oxalate) .Marland Kitchen.. 1 po qd  3) Nexium 40 Mg Cpdr (Esomeprazole magnesium) .Marland Kitchen.. 1 po qd  4) Synthroid 50 Mcg Tabs (Levothyroxine sodium) .... Take (1) one tab as directed  5) Vicodin 5-500 Mg Tabs (Hydrocodone-acetaminophen) .Marland Kitchen.. 1 po q 4-6 hrs prn  6) Ambien 5 Mg Tabs (Zolpidem tartrate) .Marland Kitchen.. 1 po qhs  7) Isoptin Sr 120 Mg Cr-tabs (Verapamil hcl) .Marland Kitchen.. 1 po qam and 1 po qhs  8) Miralax Powd (Polyethylene glycol 3350) .Marland KitchenMarland KitchenMarland Kitchen 17 grams qhs  9) Lisinopril  10 Mg Tabs (Lisinopril) .... 1/2 po daily  10) Topamax 25 Mg Tabs (Topiramate) .... 2 po qhs  11) Vitamin D 1000 Unit Caps (Cholecalciferol) .... Take one capsule by mouth every day       LOS: 1 day   Neshia Mckenzie M 02/18/2011, 6:55 AM

## 2011-02-18 NOTE — Progress Notes (Signed)
[facility] Progress Note  Subjective: Pamela Snow says she had severe pain last night, worst pain she has had with the diverticulitis, feels a bit better today. She says this episode is definitely worse than the last episode of diverticulitis. She is also nauseated but says that she has been having some nausea with her parathyroid issues. She is scheduled to have parathyroid surgery with Dr. Gerrit Friends on December 7.  Objective: Vital signs in last 24 hours: Temp:  [97.3 F (36.3 C)-98.4 F (36.9 C)] 98.3 F (36.8 C) (11/15 0700) Pulse Rate:  [60-73] 73  (11/15 0700) Resp:  [18-20] 18  (11/15 0700) BP: (102-142)/(66-82) 142/68 mmHg (11/15 0700) SpO2:  [97 %-98 %] 97 % (11/15 0700) Weight:  [73.11 kg (161 lb 2.9 oz)-73.211 kg (161 lb 6.4 oz)] 161 lb 2.9 oz (73.11 kg) (11/14 1645) Last BM Date: 02/17/11 General:   Alert,  Well-developed, well-nourished, pleasant and cooperative in NAD, somewhat tearful, anxious Head:  Normocephalic and atraumatic. Eyes:  Sclera clear, no icterus.   Conjunctiva pink. Mouth:  No deformity or lesions, dentition normal. Neck:  Supple; no masses or thyromegaly. Heart:  Regular rate and rhythm; no murmurs, clicks, rubs,  or gallops. Abdomen:  Soft, tender mid quadrant and left lower quadrant with some guarding no rebound No masses, hepatosplenomegaly or hernias noted, bowel sounds are active  Neurologic:  Alert and  oriented x4;  grossly normal neurologically. Skin:  Intact without significant lesions or rashes. Cervical Nodes:  No significant cervical adenopathy. Psych:  Alert and cooperative. Normal mood and affect.  Intake/Output from previous day: 11/14 0701 - 11/15 0700 In: 50 [IV Piggyback:50] Out: -  Intake/Output this shift:    Lab Results:  Basename 02/18/11 0503 02/17/11 1739  WBC 6.9 5.5  HGB 11.6* 11.9*  HCT 35.1* 34.6*  PLT 263 274   BMET  Basename 02/18/11 0503 02/17/11 1739  NA 138 138  K 3.9 3.4*  CL 105 105  CO2 24  23  GLUCOSE 95 88  BUN 13 15  CREATININE 0.89 0.93  CALCIUM 10.5 10.8*   LFT  Basename 02/17/11 1739  PROT 6.9  ALBUMIN 3.6  AST 15  ALT 14  ALKPHOS 30*  BILITOT 0.4  BILIDIR --  IBILI --   PT/INR  Basename 02/17/11 1739  LABPROT 13.4  INR 1.00     Studies/Results: Ct Abdomen Pelvis W Contrast  02/17/2011  *RADIOLOGY REPORT*  Clinical Data: Sudden onset of pelvic pain.  History of diverticulitis.  CT ABDOMEN AND PELVIS WITH CONTRAST  Technique:  Multidetector CT imaging of the abdomen and pelvis was performed following the standard protocol during bolus administration of intravenous contrast.  Contrast: OMNIPAQUE IOHEXOL 300 MG/ML IV SOLN  Comparison: 07/23/2010  Findings: Lung bases are clear.  No pleural or pericardial fluid. The liver appears normal without focal lesions or biliary ductal dilatation.  No calcified gallstones.  The spleen is normal.  The pancreas is normal.  The adrenal glands are normal. The right kidney is normal except for a 5 mm cyst in the lower pole.  The left kidney is normal except for a 13 mm cyst in the upper pole. There is mild atherosclerotic change of the aorta but no aneurysm. The IVC is normal.  No retroperitoneal mass or adenopathy.  The appendix is positioned in the right upper quadrant to mid abdomen ( mobile cecum) and does not appear inflamed.  There is chronic diverticulosis of the distal descending and sigmoid colon.  There is  acute diverticulitis with moderate inflammatory changes in the peri colic fat.  No evidence of abscess.  Findings are more pronounced than were seen in Aug 06, 2022.  IMPRESSION: Acute diverticulitis in the sigmoid colon region, more advanced than was seen in 08-06-22.  Pericolic inflammatory changes without discrete abscess formation.  Original Report Authenticated By: Thomasenia Sales, M.D.     Assessment / Plan:  #61 70 year old female with acute sigmoid, and descending colon diverticulitis. No evidence for perforation or  abscess on CT. Continue bowel rest today, increased frequency of analgesics and antiemetics Continue aztreonam IV We discussed eventual left colon resection given recurrent nature of her diverticulitis and significant episodes requiring hospitalization. She is agreeable area She is also dealing with the parathyroid issues currently and is scheduled to have surgery with Dr. Gerrit Friends on December 7. We will involve Dr. Gerrit Friends  outpatient regarding elective colon resection.     LOS: 1 day   Amy Esterwood  02/18/2011, 10:24 AM

## 2011-02-18 NOTE — Telephone Encounter (Signed)
Call received from Amy, PA with [facility]. Pamela Snow is inpatient at [facility] with diverticulitis. She would like [place] to consult with her regarding this we will be performing parathyroid surgery in December.

## 2011-02-19 DIAGNOSIS — K5732 Diverticulitis of large intestine without perforation or abscess without bleeding: Secondary | ICD-10-CM

## 2011-02-19 MED ORDER — POLYETHYLENE GLYCOL 3350 17 G PO PACK
17.0000 g | PACK | Freq: Every day | ORAL | Status: DC
  Administered 2011-02-19 – 2011-02-20 (×2): via ORAL
  Filled 2011-02-19 (×4): qty 1

## 2011-02-19 NOTE — Progress Notes (Signed)
[facility] Progress Note  Subjective: Pamela Snow is feeling much better today, she says she feels like it different person. Her pain is significantly improved and she has not required any IV pain medication overnight. Hungry and wants to try to eat. She is having loose bowel movements.. She is feeling so much better she is hoping to go home this weekend.  Objective: Vital signs in last 24 hours: Temp:  [98.1 F (36.7 C)-98.6 F (37 C)] 98.1 F (36.7 C) (11/16 0558) Pulse Rate:  [65-71] 71  (11/16 0558) Resp:  [18] 18  (11/16 0558) BP: (132-147)/(73-79) 147/76 mmHg (11/16 0558) SpO2:  [94 %-95 %] 95 % (11/16 0558) Last BM Date: 02/18/11 General:   Alert,  Well-developed, well-nourished, pleasant and cooperative in NAD Head:  Normocephalic and atraumatic. Eyes:  Sclera clear, no icterus.   Conjunctiva pink. Heart:  Regular rate and rhythm; no murmurs, clicks, rubs,  or gallops. Abdomen:  Soft, basically nontender and nondistended. No masses, hepatosplenomegaly or hernias noted. Normal bowel sounds, without guarding, and without rebound.   Msk:  Symmetrical without gross deformities. Normal posture. Pulses:  Normal pulses noted. Extremities:  Without clubbing or edema. Neurologic:  Alert and  oriented x4;  grossly normal neurologically.    Psych:  Alert and cooperative. Normal mood and affectIntake/Output from previous day: 11/15 0701 - 11/16 0700 In: 340 [P.O.:180; IV Piggyback:160] Out: -  Intake/Output this shift:    Lab Results:  [facility] 02/18/11 0503 02/17/11 1739  WBC 6.9 5.5  HGB 11.6* 11.9*  HCT 35.1* 34.6*  PLT 263 274   BMET  Basename 02/18/11 0503 02/17/11 1739  NA 138 138  K 3.9 3.4*  CL 105 105  CO2 24 23  GLUCOSE 95 88  BUN 13 15  CREATININE 0.89 0.93  CALCIUM 10.5 10.8*   LFT  Basename 02/17/11 1739  PROT 6.9  ALBUMIN 3.6  AST 15  ALT 14  ALKPHOS 30*  BILITOT 0.4  BILIDIR --  IBILI --   PT/INR  Basename 02/17/11 1739    LABPROT 13.4  INR 1.00        Studies/Results: Ct Abdomen Pelvis W Contrast  02/17/2011  *RADIOLOGY REPORT*  Clinical Data: Sudden onset of pelvic pain.  History of diverticulitis.  CT ABDOMEN AND PELVIS WITH CONTRAST  Technique:  Multidetector CT imaging of the abdomen and pelvis was performed following the standard protocol during bolus administration of intravenous contrast.  Contrast: OMNIPAQUE IOHEXOL 300 MG/ML IV SOLN  Comparison: 07/23/2010  Findings: Lung bases are clear.  No pleural or pericardial fluid. The liver appears normal without focal lesions or biliary ductal dilatation.  No calcified gallstones.  The spleen is normal.  The pancreas is normal.  The adrenal glands are normal. The right kidney is normal except for a 5 mm cyst in the lower pole.  The left kidney is normal except for a 13 mm cyst in the upper pole. There is mild atherosclerotic change of the aorta but no aneurysm. The IVC is normal.  No retroperitoneal mass or adenopathy.  The appendix is positioned in the right upper quadrant to mid abdomen ( mobile cecum) and does not appear inflamed.  There is chronic diverticulosis of the distal descending and sigmoid colon.  There is acute diverticulitis with moderate inflammatory changes in the peri colic fat.  No evidence of abscess.  Findings are more pronounced than were seen in Jul 25, 2022.  IMPRESSION: Acute diverticulitis in the sigmoid colon region, more advanced than was seen  in April.  Pericolic inflammatory changes without discrete abscess formation.  Original Report Authenticated By: Thomasenia Sales, M.D.     Assessment / Plan: #51 70 year old female with acute descending and sigmoid diverticulitis. Symptomatically much improved. Will advance to solid diet today and continue aztreonam IV. If she tolerates solid food and pain remains under good control she probably will be stable for discharge tomorrow. I have left a message with Dr. Gerrit Friends regarding outpatient  consultation for left colon resection. Patient is already scheduled with him for a parathyroid resection the first week of December. Advance to low-residue diet Patient is allergic to penicillin and had a serum 6 and serum sickness reaction to Flagyl and Cipro. I will consult with a PharmD here at the hospital regarding selection of oral antibiotics for discharge.  I contacted pharm D - they recommend  Septra  and clindamycin combination. I am hesitant to send her home on clindamycin-would favor giving her 2 more days of IV aztreonam, then home on po septra ds bid   -will discuss with MD.    LOS: 2 days   Amy Esterwood  02/19/2011, 9:24 AM     ________________________________________________________________________  [facility] MD note:  I personally examined the patient, reviewed the data and agree with the assessment and plan described above.  Would stay on the IV aztreonam until Sunday and then OK to go hom on septra for another 7 days.  Dr. Elnoria Howard is covering this weekend   Rob Bunting, MD [facility] Pager [phone]

## 2011-02-19 NOTE — Progress Notes (Signed)
Subjective: Hospital Day 3 for Diverticulitis on IV ABx - Aztreonam.  I backed down on her HTN meds yesterday and adjusted her Topomax. Pain improving.  She is asking @ advancing diet.  Past History  Past Medical History (reviewed - no changes required): Depression, edema (peripheral), GERD (stricture), hypothyroidism, insomnia, diverticulosis/Diverticulitis, severe sinusitis with HA, DDD, bilateral knee arthritis  Hypercalcemia/Elevated PTHi/Elevated BP - HTN Urgency admission.  HA's - topamax.  hyperparathyroidism  Diverticulitis 07/2010  Serum Sickness issue 07/2010 Admission  Surgical History (reviewed - no changes required): TAH/BSO (2002), T&A, broken elbow, BCC on nose (removed), (R) TKA    Objective: Vital signs in last 24 hours: Temp:  [98.1 F (36.7 C)-98.6 F (37 C)] 98.1 F (36.7 C) (11/16 0558) Pulse Rate:  [65-73] 71  (11/16 0558) Resp:  [18] 18  (11/16 0558) BP: (132-147)/(68-79) 147/76 mmHg (11/16 0558) SpO2:  [94 %-97 %] 95 % (11/16 0558) Weight change:  Last BM Date: 02/18/11  Intake/Output from previous day: 11/15 0701 - 11/16 0700 In: 180 [P.O.:180] Out: -  Intake/Output this shift: Total I/O In: 180 [P.O.:180] Out: -   General appearance: alert, cooperative and appears stated age Eyes: no scleral icterus Throat: oropharynx moist without erythema Resp: clear to auscultation bilaterally Cardio: regular rate and rhythm, S1, S2 normal, no murmur, click, rub or gallop Extremities: no clubbing, cyanosis or edema Ab - soft but tender.  No Rebound or guarding.  Lab Results:  [facility] 02/18/11 0503 02/17/11 1739  NA 138 138  K 3.9 3.4*  CL 105 105  CO2 24 23  GLUCOSE 95 88  BUN 13 15  CREATININE 0.89 0.93  CALCIUM 10.5 10.8*  MG -- --  PHOS -- --    Basename 02/17/11 1739  AST 15  ALT 14  ALKPHOS 30*  BILITOT 0.4  PROT 6.9  ALBUMIN 3.6    Basename 02/18/11 0503 02/17/11 1739  WBC 6.9 5.5  NEUTROABS -- 3.4  HGB 11.6* 11.9*  HCT 35.1*  34.6*  MCV 92.4 90.6  PLT 263 274   No results found for this basename: CKTOTAL:3,CKMB:3,CKMBINDEX:3,TROPONINI:3 in the last 72 hours No results found for this basename: TSH,T4TOTAL,FREET3,T3FREE,THYROIDAB in the last 72 hours No results found for this basename: VITAMINB12:2,FOLATE:2,FERRITIN:2,TIBC:2,IRON:2,RETICCTPCT:2 in the last 72 hours  Studies/Results: Ct Abdomen Pelvis W Contrast  02/17/2011  *RADIOLOGY REPORT*  Clinical Data: Sudden onset of pelvic pain.  History of diverticulitis.  CT ABDOMEN AND PELVIS WITH CONTRAST  Technique:  Multidetector CT imaging of the abdomen and pelvis was performed following the standard protocol during bolus administration of intravenous contrast.  Contrast: OMNIPAQUE IOHEXOL 300 MG/ML IV SOLN  Comparison: 07/23/2010  Findings: Lung bases are clear.  No pleural or pericardial fluid. The liver appears normal without focal lesions or biliary ductal dilatation.  No calcified gallstones.  The spleen is normal.  The pancreas is normal.  The adrenal glands are normal. The right kidney is normal except for a 5 mm cyst in the lower pole.  The left kidney is normal except for a 13 mm cyst in the upper pole. There is mild atherosclerotic change of the aorta but no aneurysm. The IVC is normal.  No retroperitoneal mass or adenopathy.  The appendix is positioned in the right upper quadrant to mid abdomen ( mobile cecum) and does not appear inflamed.  There is chronic diverticulosis of the distal descending and sigmoid colon.  There is acute diverticulitis with moderate inflammatory changes in the peri colic fat.  No evidence  of abscess.  Findings are more pronounced than were seen in 08/04/2022.  IMPRESSION: Acute diverticulitis in the sigmoid colon region, more advanced than was seen in 2022-08-04.  Pericolic inflammatory changes without discrete abscess formation.  Original Report Authenticated By: Thomasenia Sales, M.D.     Medications: Scheduled:    . aztreonam  1 g  Intravenous Q8H  . enoxaparin  40 mg Subcutaneous Q24H  . escitalopram  10 mg Oral Daily  . levothyroxine  50 mcg Oral Daily  . lisinopril  10 mg Oral Daily  . ondansetron  4 mg Intravenous Once  . pantoprazole sodium  40 mg Oral Daily  . potassium chloride  20 mEq Oral Once  . topiramate  75 mg Oral Daily  . verapamil  80 mg Oral BID  . DISCONTD: topiramate  25 mg Oral Daily  . DISCONTD: verapamil  120 mg Oral BID   Continuous:   Assessment/Plan: Active Problems:  Diverticulitis large intestine  Problem # 1: Diverticulitis - Now on Aztreonam.  WBC is fine.  Consider surgical consult prn.  She is slowly improving.  We talked @ elective Sigmoidectomy after this hospitalization. Plan per GI.  Ct showed: Acute diverticulitis in the sigmoid colon region, more advanced than was seen in 2022-08-04.  Pericolic inflammatory changes without discrete abscess formation. Pain controlled  Problem # 2: HEADACHE (ICD-784.0)  better on the topamax and rare Vicodin   Problem # 3: PRIMARY HYPERPARATHYROIDISM (ICD-252.01)  Sestamibi showed L superior Parathyroid adenoma and correlates with the first labs of elevated Calcium and PTHi.  The labs have normalized over time but we are left with an abnormal scan.  She has seen Dr Gerrit Friends as recently as 11/7 and were planning minimally invasive Parathyroidectomy in December.  Calcium up on admit but better on 2nd check.  Problem # 4: HYPERCALCEMIA (ICD-275.42)  For parathyroidectomy in future.   Problem # 5: ELEVATED BLOOD PRESSURE (ICD-796.2)  We have been weaning meds.  Isoptin Sr 120 Mg Cr-tabs (Verapamil hcl) .Marland Kitchen... 1 po qam and 1 po qhs - I lowered dose to 80 mg PO BID yesterday and BP is borderline today - follow and adjust more prn. Lisinopril 10 Mg Tabs (Lisinopril) .Marland Kitchen... 1/2 po daily   Problem # 6: Anxiety/Depression- Xanax/Lexapro   Problem #7 DVT Prohylaxis   Problem #8: GERD - Nexium   Problem #9 Hypothyroid on Synthroid   Problem #10 Vit  D Deficiency- OK for Replacement   Problem 11 - hypokalemia -repleted  Problem 12 - Anemia secondary to current illness - Follow.  Complete OutPt Medication List:  1) Alprazolam 0.25 Mg Tabs (Alprazolam) .Marland Kitchen.. 1 po tid prn  2) Lexapro 10 Mg Tabs (Escitalopram oxalate) .Marland Kitchen.. 1 po qd  3) Nexium 40 Mg Cpdr (Esomeprazole magnesium) .Marland Kitchen.. 1 po qd  4) Synthroid 50 Mcg Tabs (Levothyroxine sodium) .... Take (1) one tab as directed  5) Vicodin 5-500 Mg Tabs (Hydrocodone-acetaminophen) .Marland Kitchen.. 1 po q 4-6 hrs prn  6) Ambien 5 Mg Tabs (Zolpidem tartrate) .Marland Kitchen.. 1 po qhs  7) Isoptin Sr 120 Mg Cr-tabs (Verapamil hcl) .Marland Kitchen.. 1 po qam and 1 po qhs  8) Miralax Powd (Polyethylene glycol 3350) .Marland KitchenMarland KitchenMarland Kitchen 17 grams qhs  9) Lisinopril 10 Mg Tabs (Lisinopril) .... 1/2 po daily  10) Topamax 25 Mg Tabs (Topiramate) .... 3 po qhs  11) Vitamin D 1000 Unit Caps (Cholecalciferol) .... Take one capsule by mouth every day       LOS: 2 days   Courtland Reas M  02/19/2011, 6:29 AM

## 2011-02-19 NOTE — Telephone Encounter (Signed)
Amy PA will leave voicemail for Dr. Gerrit Friends today 02/19/2011.

## 2011-02-20 MED ORDER — FLUCONAZOLE 200 MG PO TABS
200.0000 mg | ORAL_TABLET | Freq: Once | ORAL | Status: AC
  Administered 2011-02-20: 200 mg via ORAL
  Filled 2011-02-20: qty 1

## 2011-02-20 MED ORDER — FLUCONAZOLE 100 MG PO TABS
100.0000 mg | ORAL_TABLET | Freq: Every day | ORAL | Status: DC
  Administered 2011-02-21: 100 mg via ORAL
  Filled 2011-02-20 (×2): qty 1

## 2011-02-20 MED ORDER — MAGIC MOUTHWASH
10.0000 mL | Freq: Four times a day (QID) | ORAL | Status: DC | PRN
  Administered 2011-02-20 – 2011-02-21 (×2): 10 mL via ORAL
  Filled 2011-02-20 (×2): qty 10

## 2011-02-20 NOTE — Progress Notes (Signed)
  Subjective: The patient feels well.  Much better than her admission status.  Objective: Vital signs in last 24 hours: Temp:  [97.8 F (36.6 C)] 97.8 F (36.6 C) (11/17 0700) Pulse Rate:  [63] 63  (11/17 0700) Resp:  [18-53] 18  (11/17 0700) BP: (101-124)/(61-70) 124/70 mmHg (11/17 0700) SpO2:  [96 %-100 %] 100 % (11/17 0700) Last BM Date: 02/18/11  Intake/Output from previous day: 11/16 0701 - 11/17 0700 In: 510 [P.O.:360; IV Piggyback:150] Out: -  Intake/Output this shift: Total I/O In: 360 [P.O.:360] Out: -   General appearance: alert and no distress Resp: clear to auscultation bilaterally Cardio: regular rate and rhythm, S1, S2 normal, no murmur, click, rub or gallop GI: soft, non-tender; bowel sounds normal; no masses,  no organomegaly Extremities: extremities normal, atraumatic, no cyanosis or edema  Lab Results:  [facility] 02/18/11 0503 02/17/11 1739  WBC 6.9 5.5  HGB 11.6* 11.9*  HCT 35.1* 34.6*  PLT 263 274   BMET  Basename 02/18/11 0503 02/17/11 1739  NA 138 138  K 3.9 3.4*  CL 105 105  CO2 24 23  GLUCOSE 95 88  BUN 13 15  CREATININE 0.89 0.93  CALCIUM 10.5 10.8*   LFT  Basename 02/17/11 1739  PROT 6.9  ALBUMIN 3.6  AST 15  ALT 14  ALKPHOS 30*  BILITOT 0.4  BILIDIR --  IBILI --   PT/INR  Basename 02/17/11 1739  LABPROT 13.4  INR 1.00   Hepatitis Panel No results found for this basename: HEPBSAG,HCVAB,HEPAIGM,HEPBIGM in the last 72 hours C-Diff No results found for this basename: CDIFFTOX:3 in the last 72 hours Fecal Lactopherrin No results found for this basename: FECLLACTOFRN in the last 72 hours  Studies/Results: No results found.  Medications:  Scheduled:   . aztreonam  1 g Intravenous Q8H  . enoxaparin  40 mg Subcutaneous Q24H  . escitalopram  10 mg Oral Daily  . fluconazole  100 mg Oral Daily  . fluconazole  200 mg Oral Once  . levothyroxine  50 mcg Oral Daily  . lisinopril  10 mg Oral Daily  . polyethylene glycol   17 g Oral Daily  . topiramate  75 mg Oral Daily  . verapamil  80 mg Oral BID  . DISCONTD: pantoprazole sodium  40 mg Oral Daily   Continuous:   Assessment/Plan: 1) Left sided diverticulitis. 2) Oral Candidiasis.   The patient continues to progress clinically.  No problems with tolerating aztreonam.  Plan: 1) Anticipate D/C tomorrow.  The patient wants to go home and I do not think that if she misses one dose of aztreonam will have any significant adverse consequences. 2) She will start on Septa upon D/C per Dr. Larae Grooms notes. 3) Start Fluconazole if it is approved by pharmacy with her allergic reactions to flagyl.  LOS: 3 days   Pamela Snow D 02/20/2011, 8:33 AM

## 2011-02-20 NOTE — Progress Notes (Signed)
Subjective: Hospital Day 4 for Diverticulitis on IV ABx - Aztreonam.  I backed down on her HTN meds and adjusted her Topomax. Pain improving.  She is asking @ advancing diet. She is doing much batter  Past History  Past Medical History (reviewed - no changes required): Depression, edema (peripheral), GERD (stricture), hypothyroidism, insomnia, diverticulosis/Diverticulitis, severe sinusitis with HA, DDD, bilateral knee arthritis  Hypercalcemia/Elevated PTHi/Elevated BP - HTN Urgency admission.  HA's - topamax.  hyperparathyroidism  Diverticulitis 07/2010  Serum Sickness issue 07/2010 Admission  Surgical History (reviewed - no changes required): TAH/BSO (2002), T&A, broken elbow, BCC on nose (removed), (R) TKA    Objective: Vital signs in last 24 hours: Temp:  [97.8 F (36.6 C)] 97.8 F (36.6 C) (11/17 0700) Pulse Rate:  [63] 63  (11/17 0700) Resp:  [18-53] 18  (11/17 0700) BP: (101-124)/(61-70) 124/70 mmHg (11/17 0700) SpO2:  [96 %-100 %] 100 % (11/17 0700) Weight change:  Last BM Date: 02/18/11  Intake/Output from previous day: 11/16 0701 - 11/17 0700 In: 510 [P.O.:360; IV Piggyback:150] Out: -  Intake/Output this shift: Total I/O In: 360 [P.O.:360] Out: -    PE)  General appearance: alert, cooperative and appears stated age  Oral Candidiasis. Resp: clear to auscultation bilaterally  Cardio: regular rate and rhythm, S1, S2 normal, no murmur, click, rub or gallop  Extremities: no clubbing, cyanosis or edema  Ab - softer and improving. No Rebound or guarding.   Lab Results:  [facility] 02/18/11 0503 02/17/11 1739  NA 138 138  K 3.9 3.4*  CL 105 105  CO2 24 23  GLUCOSE 95 88  BUN 13 15  CREATININE 0.89 0.93  CALCIUM 10.5 10.8*  MG -- --  PHOS -- --    Basename 02/17/11 1739  AST 15  ALT 14  ALKPHOS 30*  BILITOT 0.4  PROT 6.9  ALBUMIN 3.6    Basename 02/18/11 0503 02/17/11 1739  WBC 6.9 5.5  NEUTROABS -- 3.4  HGB 11.6* 11.9*  HCT 35.1* 34.6*  MCV  92.4 90.6  PLT 263 274   No results found for this basename: CKTOTAL:3,CKMB:3,CKMBINDEX:3,TROPONINI:3 in the last 72 hours No results found for this basename: TSH,T4TOTAL,FREET3,T3FREE,THYROIDAB in the last 72 hours No results found for this basename: VITAMINB12:2,FOLATE:2,FERRITIN:2,TIBC:2,IRON:2,RETICCTPCT:2 in the last 72 hours  Studies/Results: No results found.   Medications: Scheduled:    . aztreonam  1 g Intravenous Q8H  . enoxaparin  40 mg Subcutaneous Q24H  . escitalopram  10 mg Oral Daily  . fluconazole  100 mg Oral Daily  . fluconazole  200 mg Oral Once  . levothyroxine  50 mcg Oral Daily  . lisinopril  10 mg Oral Daily  . polyethylene glycol  17 g Oral Daily  . topiramate  75 mg Oral Daily  . verapamil  80 mg Oral BID  . DISCONTD: pantoprazole sodium  40 mg Oral Daily   Continuous:   Assessment/Plan: Active Problems:  Diverticulitis large intestine  Problem # 1: Diverticulitis - Now on Aztreonam.  WBC is fine.  Consider surgical consult prn.  She is slowly improving.  We talked @ elective Sigmoidectomy after this hospitalization. Plan per GI.  Home Tomorrow and switch to PO Septra. Ct showed: Acute diverticulitis in the sigmoid colon region, more advanced than was seen in April.  Pericolic inflammatory changes without discrete abscess formation. Pain controlled  Problem # 2: HEADACHE (ICD-784.0)  better on the topamax and rare Vicodin   Problem # 3: PRIMARY HYPERPARATHYROIDISM (ICD-252.01)  Sestamibi showed  L superior Parathyroid adenoma and correlates with the first labs of elevated Calcium and PTHi.  The labs have normalized over time but we are left with an abnormal scan.  She has seen Dr Gerrit Friends as recently as 11/7 and were planning minimally invasive Parathyroidectomy in December.  Calcium up on admit but better on 2nd check.  Problem # 4: HYPERCALCEMIA (ICD-275.42)  For parathyroidectomy in future.   Problem # 5: ELEVATED BLOOD PRESSURE (ICD-796.2)    We have been weaning meds.  Isoptin Sr 120 Mg Cr-tabs (Verapamil hcl) .Marland Kitchen... 1 po qam and 1 po qhs - I lowered dose to 80 mg PO BID yesterday and BP is borderline today - follow and adjust more prn. Lisinopril 10 Mg Tabs (Lisinopril) .Marland Kitchen... 1/2 po daily   Problem # 6: Anxiety/Depression- Xanax/Lexapro   Problem #7 DVT Prohylaxis   Problem #8: GERD - Nexium   Problem #9 Hypothyroid on Synthroid   Problem #10 Vit D Deficiency- OK for Replacement   Problem 11 - hypokalemia -repleted  Problem 12 - Anemia secondary to current illness - Follow. STable  Problem 13 - Oral Candidiasis - OK for Diflucan from my standpoint.  MMW ordered.  Complete OutPt Medication List:  1) Alprazolam 0.25 Mg Tabs (Alprazolam) .Marland Kitchen.. 1 po tid prn  2) Lexapro 10 Mg Tabs (Escitalopram oxalate) .Marland Kitchen.. 1 po qd  3) Nexium 40 Mg Cpdr (Esomeprazole magnesium) .Marland Kitchen.. 1 po qd  4) Synthroid 50 Mcg Tabs (Levothyroxine sodium) .... Take (1) one tab as directed  5) Vicodin 5-500 Mg Tabs (Hydrocodone-acetaminophen) .Marland Kitchen.. 1 po q 4-6 hrs prn  6) Ambien 5 Mg Tabs (Zolpidem tartrate) .Marland Kitchen.. 1 po qhs  7) Isoptin Sr 120 Mg Cr-tabs (Verapamil hcl) .Marland Kitchen.. 1 po qam and 1 po qhs  8) Miralax Powd (Polyethylene glycol 3350) .Marland KitchenMarland KitchenMarland Kitchen 17 grams qhs  9) Lisinopril 10 Mg Tabs (Lisinopril) .... 1/2 po daily  10) Topamax 25 Mg Tabs (Topiramate) .... 3 po qhs  11) Vitamin D 1000 Unit Caps (Cholecalciferol) .... Take one capsule by mouth every day       LOS: 3 days   Damali Broadfoot M 02/20/2011, 8:37 AM

## 2011-02-21 MED ORDER — FLUCONAZOLE 100 MG PO TABS: 100.0000 mg | ORAL_TABLET | Freq: Every day | ORAL | Status: AC

## 2011-02-21 MED ORDER — BACTRIM 400-80 MG PO TABS: 1.0000 | ORAL_TABLET | Freq: Two times a day (BID) | ORAL | Status: AC

## 2011-02-21 MED ORDER — LISINOPRIL 5 MG PO TABS
5.0000 mg | ORAL_TABLET | Freq: Every day | ORAL | Status: DC
  Filled 2011-02-21: qty 1

## 2011-02-21 NOTE — Discharge Summary (Addendum)
  Physician Discharge Summary  Patient ID: Pamela Snow MRN: [id number] DOB/AGE: 10-Sep-1940 70 y.o.  Admit date: 02/17/2011 Discharge date: 02/21/2011  Admission Diagnoses: Recurrent left sided diverticulitis.  Discharge Diagnoses: Recurrent left sided diverticulitis. Active Problems:  Diverticulitis large intestine   Discharged Condition: good  Hospital Course: The patient was started on Aztreonam upon admission.  A pharmacy consultation was obtained by Dr. Christella Hartigan, as per the notes, it was recommended that she be treated with aztreonam and bactrim.  With the aztreonam her symptoms markedly improved.  Her abdominal pain was much less to palpation.  She reports having some soreness diffusely with palpation.  Patient will follow up with [facility].  Consults: general surgery  Significant Diagnostic Studies: CT of the ABM/Pelvis  Treatments: Antibiotics, Pain medications  Discharge Exam: Blood pressure 109/65, pulse 59, temperature 97.8 F (36.6 C), temperature source Oral, resp. rate 18, height 5\' 8"  (1.727 m), weight 73.11 kg (161 lb 2.9 oz), SpO2 100.00%. General appearance: alert and no distress Resp: clear to auscultation bilaterally Cardio: regular rate and rhythm, S1, S2 normal, no murmur, click, rub or gallop GI: soft, mild diffuse tenderness; bowel sounds normal; no masses,  no organomegaly Extremities: No C/C/E  Disposition: Home or Self Care   Current Discharge Medication List    START taking these medications   Details  BACTRIM 400-80 MG per tablet Take 1 tablet by mouth 2 (two) times daily. Qty: 28 tablet, Refills: 0    fluconazole (DIFLUCAN) 100 MG tablet Take 1 tablet (100 mg total) by mouth daily. Qty: 9 tablet, Refills: 0      CONTINUE these medications which have NOT CHANGED   Details  ALPRAZolam (XANAX) 0.5 MG tablet Take 1 tablet (0.5 mg total) by mouth 3 (three) times daily as needed for sleep or anxiety. Qty: 90 tablet, Refills: 0      escitalopram (LEXAPRO) 10 MG tablet Take 10 mg by mouth daily.      esomeprazole (NEXIUM) 40 MG capsule Take 40 mg by mouth daily before breakfast.      levothyroxine (SYNTHROID, LEVOTHROID) 50 MCG tablet Take 50 mcg by mouth daily.      lisinopril (PRINIVIL,ZESTRIL) 10 MG tablet Take 10 mg by mouth daily.      topiramate (TOPAMAX) 25 MG capsule Take 25 mg by mouth. Take 3 every night     verapamil (CALAN) 120 MG tablet Take 120 mg by mouth 2 (two) times daily.      zolpidem (AMBIEN) 5 MG tablet Take 5 mg by mouth as needed. For insomnia      STOP taking these medications     polyethylene glycol powder (GLYCOLAX/MIRALAX) powder      Probiotic Product (PHILLIPS COLON HEALTH) CAPS          Signed: Theda Belfast 02/21/2011, 8:49 AM  The patient will require a surgical follow up for a left hemicolectomy in the near future.

## 2011-02-21 NOTE — Progress Notes (Signed)
Subjective: Hospital Day 5 for Diverticulitis on IV ABx - Aztreonam.  Improving and may go home today.  Past History  Past Medical History (reviewed - no changes required): Depression, edema (peripheral), GERD (stricture), hypothyroidism, insomnia, diverticulosis/Diverticulitis, severe sinusitis with HA, DDD, bilateral knee arthritis  Hypercalcemia/Elevated PTHi/Elevated BP - HTN Urgency admission.  HA's - topamax.  hyperparathyroidism  Diverticulitis 07/2010  Serum Sickness issue 07/2010 Admission  Surgical History (reviewed - no changes required): TAH/BSO (2002), T&A, broken elbow, BCC on nose (removed), (R) TKA    Objective: Vital signs in last 24 hours: Temp:  [97.8 F (36.6 C)-97.9 F (36.6 C)] 97.8 F (36.6 C) (11/18 0525) Pulse Rate:  [59-63] 59  (11/18 0525) Resp:  [18] 18  (11/18 0525) BP: (109-138)/(65-82) 109/65 mmHg (11/18 0525) SpO2:  [99 %-100 %] 100 % (11/18 0525) Weight change:  Last BM Date: 02/20/11  Intake/Output from previous day: 11/17 0701 - 11/18 0700 In: 720 [P.O.:720] Out: -  Intake/Output this shift:     PE)  General appearance: alert, cooperative and appears stated age  Oral Candidiasis. Resp: clear to auscultation bilaterally  Cardio: regular rate and rhythm, S1, S2 normal, no murmur, click, rub or gallop  Extremities: no clubbing, cyanosis or edema  Ab - softer and improving. No Rebound or guarding.   Medications: Scheduled:    . aztreonam  1 g Intravenous Q8H  . enoxaparin  40 mg Subcutaneous Q24H  . escitalopram  10 mg Oral Daily  . fluconazole  100 mg Oral Daily  . levothyroxine  50 mcg Oral Daily  . lisinopril  10 mg Oral Daily  . polyethylene glycol  17 g Oral Daily  . topiramate  75 mg Oral Daily  . verapamil  80 mg Oral BID   Continuous:   Assessment/Plan: Active Problems:  Diverticulitis large intestine  Problem # 1: Diverticulitis -  Aztreonam.  Progressing towards D/C and elective Sigmoidectomy after this  hospitalization.  Home on PO Septra per GI.  Problem # 2: PRIMARY HYPERPARATHYROIDISM (ICD-252.01)  Sestamibi showed L superior Parathyroid adenoma and correlates with the first labs of elevated Calcium and PTHi.  The labs have normalized over time but we are left with an abnormal scan.  She has seen Dr Gerrit Friends as recently as 11/7 and were planning minimally invasive Parathyroidectomy in December.  Calcium up on admit but better on 2nd check.  Problem # 3: ELEVATED BLOOD PRESSURE (ICD-796.2)  We have been weaning meds.   Problem 4 - Oral Candidiasis - OK for Diflucan and MMW.  Complete OutPt Medication List:  1) Alprazolam 0.25 Mg Tabs (Alprazolam) .Marland Kitchen.. 1 po tid prn  2) Lexapro 10 Mg Tabs (Escitalopram oxalate) .Marland Kitchen.. 1 po qd  3) Nexium 40 Mg Cpdr (Esomeprazole magnesium) .Marland Kitchen.. 1 po qd  4) Synthroid 50 Mcg Tabs (Levothyroxine sodium) .... Take (1) one tab as directed  5) Vicodin 5-500 Mg Tabs (Hydrocodone-acetaminophen) .Marland Kitchen.. 1 po q 4-6 hrs prn  6) Ambien 5 Mg Tabs (Zolpidem tartrate) .Marland Kitchen.. 1 po qhs  7) Isoptin Sr 120 Mg Cr-tabs (Verapamil hcl) .Marland Kitchen.. 1 po qam and 1 po qhs --- Lowered 8) Miralax Powd (Polyethylene glycol 3350) .Marland KitchenMarland KitchenMarland Kitchen 17 grams qhs  9) Lisinopril 10 Mg Tabs (Lisinopril) .... 1/2 po daily  10) Topamax 25 Mg Tabs (Topiramate) .... 3 po qhs  11) Vitamin D 1000 Unit Caps (Cholecalciferol) .... Take one capsule by mouth every day       LOS: 4 days   Jamesetta Greenhalgh M 02/21/2011, 10:47 AM

## 2011-02-22 ENCOUNTER — Telehealth (INDEPENDENT_AMBULATORY_CARE_PROVIDER_SITE_OTHER): Payer: Self-pay

## 2011-02-22 ENCOUNTER — Telehealth: Payer: Self-pay

## 2011-02-22 ENCOUNTER — Encounter: Payer: Self-pay | Admitting: *Deleted

## 2011-02-22 ENCOUNTER — Other Ambulatory Visit: Payer: Self-pay | Admitting: Internal Medicine

## 2011-02-22 ENCOUNTER — Ambulatory Visit (INDEPENDENT_AMBULATORY_CARE_PROVIDER_SITE_OTHER): Payer: 59 | Admitting: Gastroenterology

## 2011-02-22 VITALS — BP 118/64 | HR 72

## 2011-02-22 DIAGNOSIS — K5792 Diverticulitis of intestine, part unspecified, without perforation or abscess without bleeding: Secondary | ICD-10-CM

## 2011-02-22 DIAGNOSIS — K5732 Diverticulitis of large intestine without perforation or abscess without bleeding: Secondary | ICD-10-CM

## 2011-02-22 NOTE — Telephone Encounter (Signed)
Patient was discharged from the hospital yesterday by Dr Elnoria Howard for diverticulitis.  Patient had a reaction to IV antibiotics prior to leaving the hospital.  She states when she was getting dressed to go home from the hospital.  She noted red whelps on her chest and abdomen.  She spoke with Dr Timothy Lasso last night and he started her on a Medrol Dosepak.  He told her to hold the po bactrim she was to start on today.  She was instructed to call Dr Jarold Motto this am and let him know what Dr Timothy Lasso suggested.  I have paged Mike Gip PA at Dr Norval Gable instructions.

## 2011-02-22 NOTE — Progress Notes (Signed)
Subjective:    Patient ID: Pamela Snow, female    DOB: [birth year], 70 y.o.   MRN: [id number]  HPI Pamela Snow is a 70 year old female who is hospitalized 1114 through 02/21/2011 with recurrent sigmoid and descending colon diverticulitis. She had CT scan of the abdomen and pelvis which did show significant inflammatory changes but no evidence of perforation or abscess. Because of multiple prior antibiotic reactions pharmacy was consulted and she was placed on aztreonam IV. She had very good response to the aztreonam and her pain significantly improved within the per first 24 hours. She did not have any leukocytosis. Her diet was advanced and she tolerated this without difficulty. Because this is her third episode of diverticulitis this year the second of which has required hospitalization it was felt that surgical consultation and elective resection are indicated. Patient already is scheduled for a parathyroid resection with Dr. Gerrit Friends on 03/12/2011. Unfortunately he is out of town at this time and was not able to see her while she was in the hospital. Patient was discharged on Sunday, 02/21/2011 per Dr. Elnoria Howard. At that time was doing well and was not requiring any analgesics. Apparently prior to discharge she noted some hives, and told her nurse but did not tell Dr. Elnoria Howard. She then contacted Dr. Timothy Lasso per phone, who started her on a steroid dose pack. She was to begin Bactrim DS one twice daily on discharge. her pharmacy had to order this and therefore she has not started as yet.  She is brought back into the office today to discuss management. She continues to feel well, has no complaints of abdominal pain,  has had no fever and had a normal bowel movement this morning. Her hives are  resolving with a steroid dose pack. She is obviously concerned about any other antibiotic use,and anxious about timing of her surgeries.    Review of Systems  Constitutional: Negative.   HENT: Negative.   Eyes: Negative.    Respiratory: Negative.   Cardiovascular: Negative.   Gastrointestinal: Negative.   Genitourinary: Negative.   Musculoskeletal: Negative.   Skin: Positive for rash.  Neurological: Negative.   Hematological: Negative.   Psychiatric/Behavioral: Negative.    Outpatient Prescriptions Prior to Visit  Medication Sig Dispense Refill  . ALPRAZolam (XANAX) 0.5 MG tablet Take 1 tablet (0.5 mg total) by mouth 3 (three) times daily as needed for sleep or anxiety.  90 tablet  0  . escitalopram (LEXAPRO) 10 MG tablet Take 10 mg by mouth daily.        Marland Kitchen esomeprazole (NEXIUM) 40 MG capsule Take 40 mg by mouth daily before breakfast.        . fluconazole (DIFLUCAN) 100 MG tablet Take 1 tablet (100 mg total) by mouth daily.  9 tablet  0  . levothyroxine (SYNTHROID, LEVOTHROID) 50 MCG tablet Take 50 mcg by mouth daily.        Marland Kitchen lisinopril (PRINIVIL,ZESTRIL) 10 MG tablet Take 10 mg by mouth daily.        Marland Kitchen topiramate (TOPAMAX) 25 MG capsule Take 25 mg by mouth. Take 3 every night       . verapamil (CALAN) 120 MG tablet Take 120 mg by mouth 2 (two) times daily.        Marland Kitchen zolpidem (AMBIEN) 5 MG tablet Take 5 mg by mouth as needed. For insomnia      . BACTRIM 400-80 MG per tablet Take 1 tablet by mouth 2 (two) times daily.  28 tablet  0  Facility-Administered Medications Prior to Visit  Medication Dose Route Frequency Provider Last Rate Last Dose  . ALPRAZolam Prudy Feeler) tablet 0.5 mg  0.5 mg Oral TID PRN Louis Meckel, MD   0.5 mg at 02/20/11 2145  . aztreonam (AZACTAM) 1 g in dextrose 5 % 50 mL IVPB  1 g Intravenous Q8H Wandra Babin, PA   1 g at 02/21/11 1125  . enoxaparin (LOVENOX) injection 40 mg  40 mg Subcutaneous Q24H Louis Meckel, MD   40 mg at 02/20/11 1920  . escitalopram (LEXAPRO) tablet 10 mg  10 mg Oral Daily Louis Meckel, MD   10 mg at 02/21/11 0955  . fluconazole (DIFLUCAN) tablet 100 mg  100 mg Oral Daily Jordan Hawks Hung   100 mg at 02/21/11 0955  . HYDROmorphone (DILAUDID) injection  1-1.5 mg  1-1.5 mg Intravenous Q3H PRN Yoshika Vensel, PA   1.5 mg at 02/18/11 2024  . levothyroxine (SYNTHROID, LEVOTHROID) tablet 50 mcg  50 mcg Oral Daily Louis Meckel, MD   50 mcg at 02/21/11 0956  . lisinopril (PRINIVIL,ZESTRIL) tablet 5 mg  5 mg Oral Daily [name], MD      . magic mouthwash  10 mL Oral QID PRN [name], MD   10 mL at 02/21/11 1108  . ondansetron (ZOFRAN) injection 4 mg  4 mg Intravenous Q4H PRN Owenn Rothermel, PA   4 mg at 02/19/11 0617  . polyethylene glycol (MIRALAX / GLYCOLAX) packet 17 g  17 g Oral Daily Elion Hocker, PA      . topiramate (TOPAMAX) tablet 75 mg  75 mg Oral Daily [name], MD   75 mg at 02/20/11 2013  . verapamil (CALAN) tablet 80 mg  80 mg Oral BID [name], MD   80 mg at 02/21/11 0955  . zolpidem (AMBIEN) tablet 5 mg  5 mg Oral QHS PRN Louis Meckel, MD           Objective:   Physical Exam Well-developed white female in no acute distress, blood pressure 118/64 pulse 72. HEENT no traumatic normocephalic EOMI PERRLA sclera anicteric, neck is supple no JVD, Cardiovascular regular rate and rhythm with S1-S2 no murmur or gallop, Pulmonary; clear bilaterally abdomen soft, nondistended, nontender, bowel sounds active, no palpable mass or hepatosplenomegaly. Rectal; not done. Extremities; no clubbing , cyanosis or edema, Skin she has several scattered hives over her anterior chest and upper extremities. Psych; mood and affect normal an appropriate        Assessment & Plan:  #27 70 year old female with recurrent descending and sigmoid colitis. Status post 4 day hospitalization discharged yesterday. She is doing well and has no significant abdominal tenderness, however has not completed and the entire course of antibiotics and has now had an apparent drug reaction to aztreonam with urticaria. Have advised she continue and taper off the steroid dose pack. Will schedule for CT scan of the abdomen and pelvis tomorrow to see if there is any  objective evidence of persistent inflammatory change, then decide regarding need for bleeding a course of oral antibiotics. She has taken doxycycline successfully in the past and would probably opt for this. We have placed a call to Dr. Sid Falcon office to arrange an appointment for her for consultation for elective resection. If he cannot see her within the next week Will see if one of his partners is available. Continue MiraLax 17 g at bedtime.

## 2011-02-22 NOTE — Telephone Encounter (Signed)
Pt is scheduled for [facility] on 03-12-11. Pt was admitted last week by Dr Nyra Capes for acute diverticulitis. Dr Jarold Motto wants pt seen by Dr. Gerrit Friends next week here in office. He requests pt be evalulated for diverticular disease prior to parathyroid surgery. Copy to [place] to work with Dr Gerrit Friends re: scheduling this appt.

## 2011-02-22 NOTE — Telephone Encounter (Signed)
Discussed with Mike Gip PA patient to take bactrim as ordered.  Benadryl 25 mg 1 po q 6 hours until hives are gone.  She is to be scheduled with [facility] for follow up .  I have left a message with the triage nurses and they will call me back with an appt.  She is scheduled for a parathyroidectomy 03/12/11.     Per Mike Gip PA and Dr Jarold Motto the patient needs to now hold the bactrim until she is seen in the office today at 3:15.  Patient aware to hold bactrim and will discuss further tx this pm

## 2011-02-22 NOTE — Patient Instructions (Signed)
You have been scheduled for a CT scan of the abdomen and pelvis at [facility] ([address]---this is in the same building as [facility]).   You are scheduled on 02/23/11 at 10:30 am. You should arrive 15 minutes prior to your appointment time for registration. Please follow the written instructions below on the day of your exam:  WARNING: IF YOU ARE ALLERGIC TO IODINE/X-RAY DYE, PLEASE NOTIFY RADIOLOGY IMMEDIATELY AT [phone]! YOU WILL BE GIVEN A 13 HOUR PREMEDICATION PREP.  1) Do not eat or drink anything after 6:30 am (4 hours prior to your test) 2) You have been given 2 bottles of oral contrast to drink. The solution may taste better if refrigerated, but do NOT add ice or any other liquid to this solution. Shake well before drinking.    Drink 1 bottle of contrast @ 8:30 am (2 hours prior to your exam)  Drink 1 bottle of contrast @ 9:30 am (1 hour prior to your exam)  You may take any medications as prescribed with a small amount of water except for the following: Metformin, Glucophage, Glucovance, Avandamet, Riomet, Fortamet, Actoplus Met, Janumet, Glumetza or Metaglip. The above medications must be held the day of the exam AND 48 hours after the exam.  The purpose of you drinking the oral contrast is to aid in the visualization of your intestinal tract. The contrast solution may cause some diarrhea. Before your exam is started, you will be given a small amount of fluid to drink. Depending on your individual set of symptoms, you may also receive an intravenous injection of x-ray contrast/dye. Plan on being at [facility] for 30 minutes or long, depending on the type of exam you are having performed.  If you have any questions regarding your exam or if you need to reschedule, you may call the CT department at [phone] between the hours of 8:00 am and 5:00 pm, Monday-Friday.  ________________________________________________________________________ Stay OFF  antibiotics. Continue your steroids.

## 2011-02-23 ENCOUNTER — Ambulatory Visit (INDEPENDENT_AMBULATORY_CARE_PROVIDER_SITE_OTHER)
Admission: RE | Admit: 2011-02-23 | Discharge: 2011-02-23 | Disposition: A | Discharge status: Home / Self Care | Payer: Medicare Other | Source: Ambulatory Visit | Attending: Gastroenterology | Admitting: Gastroenterology

## 2011-02-23 ENCOUNTER — Telehealth: Payer: Self-pay | Admitting: *Deleted

## 2011-02-23 DIAGNOSIS — K5792 Diverticulitis of intestine, part unspecified, without perforation or abscess without bleeding: Secondary | ICD-10-CM

## 2011-02-23 DIAGNOSIS — K5732 Diverticulitis of large intestine without perforation or abscess without bleeding: Secondary | ICD-10-CM

## 2011-02-23 MED ORDER — IOHEXOL 300 MG/ML  SOLN
100.0000 mL | Freq: Once | INTRAMUSCULAR | Status: AC | PRN
  Administered 2011-02-23: 100 mL via INTRAVENOUS

## 2011-02-23 MED ORDER — DOXYCYCLINE HYCLATE 100 MG PO TABS: ORAL_TABLET | ORAL | Status: DC

## 2011-02-23 NOTE — Progress Notes (Signed)
Agree with assessment and plan. I think this patient needs sigmoid resection ASAP per surgery. It would seem to me that this would be a more urgent procedure that her parathyroid resection. Please send a copy of this note and her records to Dr. Sandie Ano surgery

## 2011-02-23 NOTE — Telephone Encounter (Signed)
lmom for pt to call back. She needs to begin Doxycycline 100mg  daily x 7 days- do not start the Bactrim! I left a message at [facility] with Dr Ardine Eng nurse about an appt.

## 2011-02-23 NOTE — Telephone Encounter (Signed)
Notified pt not to start the Bactrim- pharmacy had to order it- and to start the Doxycycline. Informed her I will call when I get an appt with Dr Gerrit Friends.

## 2011-02-23 NOTE — Telephone Encounter (Signed)
Message copied by Florene Glen on Tue Feb 23, 2011  2:31 PM ------      Message from: [place], [place] S      Created: Tue Feb 23, 2011  2:04 PM       Aram Beecham, please call [place]- let her know her CT scan shows  Significantly improved diverticulitis. Would like her to start Doxycycline 100 mg once daily x 7 days. Do not start the bactrim.             Also, please call Dr Sid Falcon office and push for an appt for her with him for ASAP.  Thanks.

## 2011-02-24 ENCOUNTER — Telehealth (INDEPENDENT_AMBULATORY_CARE_PROVIDER_SITE_OTHER): Payer: Self-pay

## 2011-02-24 ENCOUNTER — Telehealth: Payer: Self-pay | Admitting: *Deleted

## 2011-02-24 NOTE — Telephone Encounter (Signed)
Pamela Snow has an appointment to be seen in the office on 03/10/2011.  She is was advised to call if she needed to be seen sooner for diverticula problem.  She is currently on an oral antibiotic. She will keep the scheduled parathyroid surgery on 12/07/212.

## 2011-02-24 NOTE — Telephone Encounter (Signed)
Pt lmom stating she received a call from Zella Ball, Dr Ardine Eng nurse informing her she has an appt 03/10/11 at 10:30am. She also reported she took her 1st Doxycycline last pm. [phone]. Notified Dr Jarold Motto.

## 2011-03-04 ENCOUNTER — Other Ambulatory Visit (INDEPENDENT_AMBULATORY_CARE_PROVIDER_SITE_OTHER): Payer: Self-pay | Admitting: Surgery

## 2011-03-08 ENCOUNTER — Encounter (HOSPITAL_COMMUNITY): Payer: Self-pay

## 2011-03-10 ENCOUNTER — Encounter (HOSPITAL_COMMUNITY): Payer: Self-pay

## 2011-03-10 ENCOUNTER — Ambulatory Visit (INDEPENDENT_AMBULATORY_CARE_PROVIDER_SITE_OTHER): Payer: Medicare Other | Admitting: Surgery

## 2011-03-10 ENCOUNTER — Encounter (INDEPENDENT_AMBULATORY_CARE_PROVIDER_SITE_OTHER): Payer: Self-pay | Admitting: Surgery

## 2011-03-10 ENCOUNTER — Ambulatory Visit (HOSPITAL_COMMUNITY)
Admission: RE | Admit: 2011-03-10 | Discharge: 2011-03-10 | Disposition: A | Discharge status: Home / Self Care | Payer: Medicare Other | Source: Ambulatory Visit | Attending: Surgery | Admitting: Surgery

## 2011-03-10 VITALS — BP 132/88 | HR 80 | Temp 97.8°F | Resp 16 | Ht 68.0 in | Wt 163.8 lb

## 2011-03-10 DIAGNOSIS — K5730 Diverticulosis of large intestine without perforation or abscess without bleeding: Secondary | ICD-10-CM

## 2011-03-10 DIAGNOSIS — Z01818 Encounter for other preprocedural examination: Secondary | ICD-10-CM | POA: Insufficient documentation

## 2011-03-10 LAB — CBC
HCT: 37.2 % (ref 36.0–46.0)
Hemoglobin: 12.6 g/dL (ref 12.0–15.0)
MCH: 30.9 pg (ref 26.0–34.0)
MCHC: 33.9 g/dL (ref 30.0–36.0)
MCV: 91.2 fL (ref 78.0–100.0)
Platelets: 302 10*3/uL (ref 150–400)
RBC: 4.08 MIL/uL (ref 3.87–5.11)
RDW: 12.7 % (ref 11.5–15.5)
WBC: 5 10*3/uL (ref 4.0–10.5)

## 2011-03-10 LAB — BASIC METABOLIC PANEL
BUN: 23 mg/dL (ref 6–23)
CO2: 26 mEq/L (ref 19–32)
Calcium: 10.9 mg/dL — ABNORMAL HIGH (ref 8.4–10.5)
Chloride: 109 mEq/L (ref 96–112)
Creatinine, Ser: 1.14 mg/dL — ABNORMAL HIGH (ref 0.50–1.10)
GFR calc Af Amer: 56 mL/min — ABNORMAL LOW (ref >90)
GFR calc non Af Amer: 48 mL/min — ABNORMAL LOW (ref >90)
Glucose, Bld: 86 mg/dL (ref 70–99)
Potassium: 5.1 mEq/L (ref 3.5–5.1)
Sodium: 141 mEq/L (ref 135–145)

## 2011-03-10 LAB — SURGICAL PCR SCREEN
MRSA, PCR: NEGATIVE
Staphylococcus aureus: NEGATIVE

## 2011-03-10 NOTE — Progress Notes (Signed)
Chief Complaint  Patient presents with  . Other    est pt new prob- eval diverticullar disease    HISTORY: Patient is a 70 year old white female known to my practice. She has been evaluated for primary hyperparathyroidism and is scheduled for minimally invasive parathyroidectomy later this week.  Patient presents at this time accompanied by her husband to discuss segmental colon resection for management of diverticular disease. Patient has had 3 episodes of acute diverticulitis in the past year. 2 of these episodes required hospitalization. CT scan of the abdomen and pelvis confirmed acute diverticulitis without complication. Patient has been treated with antibiotic therapy. This is becoming increasingly difficult due to allergic type reactions to medical antibiotic regimens. Patient has had colonoscopy in the recent past. I were obtained the results of that study for review.  Patient is now asymptomatic from her diverticular disease. She continues on a low-residue diet. She takes MiraLax daily.  Patient and her husband are interested in scheduling elective sigmoid colon resection in the near future in order to prevent complications from diverticular disease.   Past Medical History  Diagnosis Date  . Hiatal hernia   . Diverticulosis   . GERD (gastroesophageal reflux disease)   . Arthritis   . Hypertension   . Generalized headaches   . Weakness   . Hyperparathyroidism   . PONV (postoperative nausea and vomiting)   . Diverticulitis   . Gastritis   . Adenomatous colon polyp   . B12 deficiency   . Chronic constipation      Current Outpatient Prescriptions  Medication Sig Dispense Refill  . ALPRAZolam (XANAX) 0.5 MG tablet Take 0.5 mg by mouth 3 (three) times daily as needed. anxiety       . escitalopram (LEXAPRO) 10 MG tablet Take 10 mg by mouth every Monday, Wednesday, and Friday.       . esomeprazole (NEXIUM) 40 MG capsule Take 40 mg by mouth daily before breakfast.       .  levothyroxine (SYNTHROID, LEVOTHROID) 50 MCG tablet Take 50 mcg by mouth every morning.       Marland Kitchen lisinopril (PRINIVIL,ZESTRIL) 10 MG tablet Take 5 mg by mouth every evening. 1/2 tab      . Polyethylene Glycol 3350 (MIRALAX PO) Take 1 scoop by mouth at bedtime.       . topiramate (TOPAMAX) 25 MG capsule Take 25 mg by mouth. Take 3 every night       . verapamil (CALAN) 120 MG tablet Take 120 mg by mouth 2 (two) times daily.       . Vitamins A & D (VITAMIN A & D) 10000-400 UNITS TABS Take 1 tablet by mouth every morning.        . zolpidem (AMBIEN) 5 MG tablet Take 5 mg by mouth as needed. For insomnia         Allergies  Allergen Reactions  . Azactam (Aztreonam) Hives  . Penicillins Hives and Other (See Comments)    Thrush  . Ciprofloxacin     Serum sickness  . Flagyl (Metronidazole Hcl)     Serum sickness     Family History  Problem Relation Age of Onset  . Colon cancer Mother   . Heart failure Mother   . Cancer Mother     colon  . Colon cancer Sister   . Cancer Sister     colon  . Colon cancer Sister   . Heart disease Father     heart attack following surgery  History   Social History  . Marital Status: Married    Spouse Name: N/A    Number of Children: 3  . Years of Education: N/A   Occupational History  . retired    Social History Main Topics  . Smoking status: Former Smoker    Quit date: 04/05/2000  . Smokeless tobacco: Never Used   Comment: quit smoking 10 yrs ago  . Alcohol Use: Yes     weekends and occasional wine during the week  . Drug Use: No  . Sexually Active: None   Other Topics Concern  . None   Social History Narrative  . None     REVIEW OF SYSTEMS - PERTINENT POSITIVES ONLY: Denies abdominal pain. Chronic constipation. No fever. No nausea. No emesis.   EXAM: Filed Vitals:   03/10/11 1047  BP: 132/88  Pulse: 80  Temp: 97.8 F (36.6 C)  Resp: 16    HEENT: normocephalic; pupils equal and reactive; sclerae clear; dentition  good; mucous membranes moist NECK:  No palpable mass; symmetric on extension; no palpable anterior or posterior cervical lymphadenopathy; no supraclavicular masses; no tenderness CHEST: clear to auscultation bilaterally without rales, rhonchi, or wheezes CARDIAC: regular rate and rhythm without significant murmur; peripheral pulses are full ABDOMEN: Soft, nontender, without distention. No surgical wounds. No palpable masses. No hepatosplenomegaly. No tenderness or mass in the left lower quadrant. No sign of hernia. EXT:  non-tender without edema; no deformity NEURO: no gross focal deficits; no sign of tremor   LABORATORY RESULTS: See E-Chart for most recent results   RADIOLOGY RESULTS: See E-Chart or I-Site for most recent results   IMPRESSION: #1 history of acute diverticulitis, recurrent #2 diverticulosis #3 chronic constipation #4 primary hyperparathyroidism, pending parathyroidectomy   PLAN: I had a lengthy discussion with the patient and her husband regarding her diverticular disease. I provided them with written literature on the surgical management of diverticular disease and particularly on sigmoid colectomy. We discussed the indications for the procedure. We discussed open versus laparoscopic technique. I have recommended open sigmoid colectomy with primary anastomosis. We discussed the risk and benefits of the procedure. We've discussed the hospital stay to be anticipated. We discussed the changes in bowel habits to be anticipated following the procedure. They understand and wish to proceed. Surgery will likely be scheduled for January 2013.  The risks and benefits of the procedure have been discussed at length with the patient.  The patient understands the proposed procedure, potential alternative treatments, and the course of recovery to be expected.  All of the patient's questions have been answered at this time.  The patient wishes to proceed with surgery and will schedule a  date for their procedure through our office staff.   Velora Heckler, MD, FACS General & Endocrine Surgery [facility]    Visit Diagnoses: 1. DIVERTICULOSIS, ASYMPTOMATIC     Primary Care Physician: [name], MD, MD  GI:  Dr. Sheryn Bison

## 2011-03-10 NOTE — Patient Instructions (Signed)
22 Pamela Snow  03/10/2011   Your procedure is scheduled on: 03/12/11   Friday   Surgery [id number]  Report to [facility] at 1100 AM.  Call this number if you have problems the morning of surgery: [phone]    [facility]  PST [id number]   Remember:   Do not eat food:After Midnight.  Thursday night  May have clear liquids:until Midnight .   Thursday night  Clear liquids include soda, tea, black coffee, apple or grape juice, broth.  Take these medicines the morning of surgery with A SIP OF WATER: NEXIUM,  SYNTHROID, VERAPAMIL with sip water,   Xanax if needed   Do not wear jewelry, make-up or nail polish.  Do not wear lotions, powders, or perfumes. You may wear deodorant.  Do not shave 48 hours prior to surgery.  Do not bring valuables to the hospital.  Contacts, dentures or bridgework may not be worn into surgery.  Leave suitcase in the car. After surgery it may be brought to your room.  For patients admitted to the hospital, checkout time is 11:00 AM the day of discharge.              No aspirin, antiinflammatories, vitamins x 7 days  Patients discharged the day of surgery will not be allowed to drive home.  Name and phone number of your driver: husband  Special Instructions: CHG Shower Use Special Wash: 1/2 bottle night before surgery and 1/2 bottle morning of surgery.   Regular soap face and privates... No shaving x 48 hours   Please read over the following fact sheets that you were given: MRSA Information

## 2011-03-10 NOTE — Progress Notes (Signed)
Quick Note:  These results are acceptable for scheduled surgery. TMG ______ 

## 2011-03-10 NOTE — Pre-Procedure Instructions (Signed)
CHEST X RAY AND EKG REPORTS FROM 8/12 IN EPIC

## 2011-03-12 ENCOUNTER — Encounter (HOSPITAL_COMMUNITY)
Admission: RE | Disposition: A | Discharge status: Home / Self Care | Payer: Self-pay | Source: Ambulatory Visit | Attending: Surgery

## 2011-03-12 ENCOUNTER — Encounter (HOSPITAL_COMMUNITY): Payer: Self-pay | Admitting: *Deleted

## 2011-03-12 ENCOUNTER — Other Ambulatory Visit (INDEPENDENT_AMBULATORY_CARE_PROVIDER_SITE_OTHER): Payer: Self-pay | Admitting: Surgery

## 2011-03-12 ENCOUNTER — Encounter (HOSPITAL_COMMUNITY): Payer: Self-pay | Admitting: Anesthesiology

## 2011-03-12 ENCOUNTER — Ambulatory Visit (HOSPITAL_COMMUNITY)
Admission: RE | Admit: 2011-03-12 | Discharge: 2011-03-12 | Disposition: A | Discharge status: Home / Self Care | Payer: Medicare Other | Source: Ambulatory Visit | Attending: Surgery | Admitting: Surgery

## 2011-03-12 ENCOUNTER — Ambulatory Visit (HOSPITAL_COMMUNITY): Payer: Medicare Other | Admitting: Anesthesiology

## 2011-03-12 DIAGNOSIS — E210 Primary hyperparathyroidism: Secondary | ICD-10-CM

## 2011-03-12 DIAGNOSIS — K219 Gastro-esophageal reflux disease without esophagitis: Secondary | ICD-10-CM | POA: Insufficient documentation

## 2011-03-12 DIAGNOSIS — Z79899 Other long term (current) drug therapy: Secondary | ICD-10-CM | POA: Insufficient documentation

## 2011-03-12 DIAGNOSIS — I10 Essential (primary) hypertension: Secondary | ICD-10-CM | POA: Insufficient documentation

## 2011-03-12 HISTORY — PX: PARATHYROIDECTOMY: SHX19

## 2011-03-12 SURGERY — PARATHYROIDECTOMY
Anesthesia: General | Site: Neck | Wound class: Clean

## 2011-03-12 MED ORDER — LACTATED RINGERS IV SOLN
INTRAVENOUS | Status: DC | PRN
  Administered 2011-03-12 (×2): via INTRAVENOUS

## 2011-03-12 MED ORDER — HYDROMORPHONE HCL PF 1 MG/ML IJ SOLN
INTRAMUSCULAR | Status: AC
  Administered 2011-03-12: 0.5 mg via INTRAVENOUS
  Filled 2011-03-12: qty 1

## 2011-03-12 MED ORDER — MIDAZOLAM HCL 5 MG/5ML IJ SOLN
INTRAMUSCULAR | Status: DC | PRN
  Administered 2011-03-12: 2 mg via INTRAVENOUS

## 2011-03-12 MED ORDER — BUPIVACAINE HCL (PF) 0.5 % IJ SOLN
INTRAMUSCULAR | Status: DC | PRN
  Administered 2011-03-12: 15 mL

## 2011-03-12 MED ORDER — ACETAMINOPHEN 10 MG/ML IV SOLN
INTRAVENOUS | Status: AC
  Filled 2011-03-12: qty 100

## 2011-03-12 MED ORDER — FENTANYL CITRATE 0.05 MG/ML IJ SOLN
INTRAMUSCULAR | Status: DC | PRN
  Administered 2011-03-12 (×2): 100 ug via INTRAVENOUS

## 2011-03-12 MED ORDER — HYDROCODONE-ACETAMINOPHEN 5-325 MG PO TABS
ORAL_TABLET | ORAL | Status: AC
  Filled 2011-03-12: qty 1

## 2011-03-12 MED ORDER — LACTATED RINGERS IV SOLN
INTRAVENOUS | Status: DC
  Administered 2011-03-12: 1000 mL via INTRAVENOUS

## 2011-03-12 MED ORDER — ACETAMINOPHEN 10 MG/ML IV SOLN
INTRAVENOUS | Status: DC | PRN
  Administered 2011-03-12: 1000 mg via INTRAVENOUS

## 2011-03-12 MED ORDER — HYDROCODONE-ACETAMINOPHEN 5-325 MG PO TABS: 1.0000 | ORAL_TABLET | ORAL | Status: AC | PRN

## 2011-03-12 MED ORDER — VANCOMYCIN HCL IN DEXTROSE 1-5 GM/200ML-% IV SOLN
1000.0000 mg | INTRAVENOUS | Status: AC
  Administered 2011-03-12: 1000 mg via INTRAVENOUS
  Filled 2011-03-12: qty 200

## 2011-03-12 MED ORDER — ONDANSETRON HCL 4 MG/2ML IJ SOLN
INTRAMUSCULAR | Status: DC | PRN
  Administered 2011-03-12: 4 mg via INTRAVENOUS

## 2011-03-12 MED ORDER — SODIUM CHLORIDE 0.9 % IR SOLN
Status: DC | PRN
  Administered 2011-03-12: 1000 mL

## 2011-03-12 MED ORDER — PROPOFOL 10 MG/ML IV EMUL
INTRAVENOUS | Status: DC | PRN
  Administered 2011-03-12: 180 mg via INTRAVENOUS

## 2011-03-12 MED ORDER — LIDOCAINE HCL (CARDIAC) 20 MG/ML IV SOLN
INTRAVENOUS | Status: DC | PRN
  Administered 2011-03-12: 60 mg via INTRAVENOUS

## 2011-03-12 MED ORDER — HYDROCODONE-ACETAMINOPHEN 5-325 MG PO TABS
1.0000 | ORAL_TABLET | ORAL | Status: DC | PRN
  Administered 2011-03-12: 1 via ORAL

## 2011-03-12 MED ORDER — BUPIVACAINE HCL (PF) 0.5 % IJ SOLN
INTRAMUSCULAR | Status: AC
  Filled 2011-03-12: qty 30

## 2011-03-12 MED ORDER — HYDROMORPHONE HCL PF 1 MG/ML IJ SOLN
0.2500 mg | INTRAMUSCULAR | Status: DC | PRN
  Administered 2011-03-12 (×2): 0.5 mg via INTRAVENOUS

## 2011-03-12 MED ORDER — VANCOMYCIN HCL IN DEXTROSE 1-5 GM/200ML-% IV SOLN
INTRAVENOUS | Status: AC
  Filled 2011-03-12: qty 200

## 2011-03-12 MED ORDER — PROMETHAZINE HCL 25 MG/ML IJ SOLN: 6.2500 mg | INTRAMUSCULAR | Status: DC | PRN

## 2011-03-12 MED ORDER — ROCURONIUM BROMIDE 100 MG/10ML IV SOLN
INTRAVENOUS | Status: DC | PRN
  Administered 2011-03-12: 10 mg via INTRAVENOUS
  Administered 2011-03-12: 40 mg via INTRAVENOUS

## 2011-03-12 MED ORDER — DEXAMETHASONE SODIUM PHOSPHATE 10 MG/ML IJ SOLN
INTRAMUSCULAR | Status: DC | PRN
  Administered 2011-03-12: 10 mg via INTRAVENOUS

## 2011-03-12 SURGICAL SUPPLY — 42 items
BANDAGE ELASTIC 4 VELCRO ST LF (GAUZE/BANDAGES/DRESSINGS) IMPLANT
BANDAGE GAUZE ELAST BULKY 4 IN (GAUZE/BANDAGES/DRESSINGS) IMPLANT
BENZOIN TINCTURE PRP APPL 2/3 (GAUZE/BANDAGES/DRESSINGS) ×3 IMPLANT
BLADE HEX COATED 2.75 (ELECTRODE) ×3 IMPLANT
BLADE SURG 15 STRL LF DISP TIS (BLADE) ×2 IMPLANT
BLADE SURG 15 STRL SS (BLADE) ×1
BLADE SURG SZ10 CARB STEEL (BLADE) ×3 IMPLANT
CLIP TI MEDIUM LARGE 6 (CLIP) ×6 IMPLANT
CLIP TI WIDE RED SMALL 6 (CLIP) ×6 IMPLANT
CLOTH BEACON ORANGE TIMEOUT ST (SAFETY) ×3 IMPLANT
CONT SPECI 4OZ STER CLIK (MISCELLANEOUS) IMPLANT
COVER SURGICAL LIGHT HANDLE (MISCELLANEOUS) ×3 IMPLANT
DISSECTOR ROUND CHERRY 3/8 STR (MISCELLANEOUS) ×3 IMPLANT
DRAPE PED LAPAROTOMY (DRAPES) ×3 IMPLANT
ELECT REM PT RETURN 9FT ADLT (ELECTROSURGICAL) ×3
ELECTRODE REM PT RTRN 9FT ADLT (ELECTROSURGICAL) ×2 IMPLANT
GAUZE SPONGE 4X4 16PLY XRAY LF (GAUZE/BANDAGES/DRESSINGS) ×3 IMPLANT
GLOVE BIOGEL PI IND STRL 7.0 (GLOVE) ×2 IMPLANT
GLOVE BIOGEL PI INDICATOR 7.0 (GLOVE) ×1
GLOVE SURG ORTHO 8.0 STRL STRW (GLOVE) ×3 IMPLANT
GOWN STRL NON-REIN LRG LVL3 (GOWN DISPOSABLE) ×3 IMPLANT
GOWN STRL REIN XL XLG (GOWN DISPOSABLE) ×6 IMPLANT
HEMOSTAT SURGICEL 2X14 (HEMOSTASIS) ×3 IMPLANT
KIT BASIN OR (CUSTOM PROCEDURE TRAY) ×3 IMPLANT
NS IRRIG 1000ML POUR BTL (IV SOLUTION) ×3 IMPLANT
PACK BASIC VI WITH GOWN DISP (CUSTOM PROCEDURE TRAY) ×3 IMPLANT
PEN SKIN MARKING BROAD (MISCELLANEOUS) ×3 IMPLANT
PENCIL BUTTON HOLSTER BLD 10FT (ELECTRODE) ×3 IMPLANT
SPONGE GAUZE 4X4 12PLY (GAUZE/BANDAGES/DRESSINGS) ×3 IMPLANT
STAPLER VISISTAT 35W (STAPLE) ×3 IMPLANT
STRIP CLOSURE SKIN 1/2X4 (GAUZE/BANDAGES/DRESSINGS) ×3 IMPLANT
SUT PROLENE 4 0 RB 1 (SUTURE)
SUT PROLENE 4-0 RB1 .5 CRCL 36 (SUTURE) IMPLANT
SUT SILK 3 0 (SUTURE) ×1
SUT SILK 3-0 18XBRD TIE 12 (SUTURE) ×2 IMPLANT
SUT VIC AB 3-0 SH 18 (SUTURE) ×3 IMPLANT
SUT VIC AB 4-0 PS2 27 (SUTURE) IMPLANT
SYR BULB 3OZ (MISCELLANEOUS) IMPLANT
TAPE CLOTH SURG 4X10 WHT LF (GAUZE/BANDAGES/DRESSINGS) ×3 IMPLANT
TOWEL OR 17X26 10 PK STRL BLUE (TOWEL DISPOSABLE) ×6 IMPLANT
WATER STERILE IRR 1500ML POUR (IV SOLUTION) IMPLANT
YANKAUER SUCT BULB TIP 10FT TU (MISCELLANEOUS) ×3 IMPLANT

## 2011-03-12 NOTE — Anesthesia Preprocedure Evaluation (Signed)
Anesthesia Evaluation  Patient identified by MRN, date of birth, ID band Patient awake    Reviewed: Allergy & Precautions, H&P , NPO status , Patient's Chart, lab work & pertinent test results  History of Anesthesia Complications (+) PONV  Airway Mallampati: III TM Distance: >3 FB Neck ROM: Full    Dental No notable dental hx.    Pulmonary neg pulmonary ROS,  clear to auscultation  Pulmonary exam normal       Cardiovascular hypertension, neg cardio ROS Regular Normal    Neuro/Psych Negative Neurological ROS  Negative Psych ROS   GI/Hepatic negative GI ROS, Neg liver ROS, hiatal hernia, GERD-  ,  Endo/Other  Negative Endocrine ROS  Renal/GU negative Renal ROS  Genitourinary negative   Musculoskeletal negative musculoskeletal ROS (+)   Abdominal   Peds negative pediatric ROS (+)  Hematology negative hematology ROS (+)   Anesthesia Other Findings   Reproductive/Obstetrics negative OB ROS                           Anesthesia Physical Anesthesia Plan  ASA: II  Anesthesia Plan: General   Post-op Pain Management:    Induction: Intravenous  Airway Management Planned: Oral ETT  Additional Equipment:   Intra-op Plan:   Post-operative Plan: Extubation in OR  Informed Consent: I have reviewed the patients History and Physical, chart, labs and discussed the procedure including the risks, benefits and alternatives for the proposed anesthesia with the patient or authorized representative who has indicated his/her understanding and acceptance.   Dental advisory given  Plan Discussed with: CRNA  Anesthesia Plan Comments:         Anesthesia Quick Evaluation

## 2011-03-12 NOTE — Anesthesia Postprocedure Evaluation (Signed)
  Anesthesia Post-op Note  Patient: Pamela Snow  Procedure(s) Performed:  PARATHYROIDECTOMY - Left superior parathyroidectomy, minimally invasive   Patient Location: PACU  Anesthesia Type: General  Level of Consciousness: awake and alert   Airway and Oxygen Therapy: Patient Spontanous Breathing  Post-op Pain: mild  Post-op Assessment: Post-op Vital signs reviewed, Patient's Cardiovascular Status Stable, Respiratory Function Stable, Patent Airway and No signs of Nausea or vomiting  Post-op Vital Signs: stable  Complications: No apparent anesthesia complications

## 2011-03-12 NOTE — Transfer of Care (Signed)
Immediate Anesthesia Transfer of Care Note  Patient: Pamela Snow  Procedure(s) Performed:  PARATHYROIDECTOMY - Left superior parathyroidectomy, minimally invasive   Patient Location: PACU  Anesthesia Type: General  Level of Consciousness: alert  and oriented  Airway & Oxygen Therapy: Patient Spontanous Breathing and Patient connected to face mask oxygen  Post-op Assessment: Report given to PACU RN and Post -op Vital signs reviewed and stable  Post vital signs: Reviewed and stable  Complications: No apparent anesthesia complications

## 2011-03-12 NOTE — Op Note (Signed)
OPERATIVE REPORT - PARATHYROIDECTOMY  Preoperative diagnosis: Primary hyperparathyroidism  Postop diagnosis: Same  Procedure: Left superior minimally invasive parathyroidectomy  Surgeon:  Velora Heckler, MD, FACS  Anesthesia: Gen. endotracheal  Estimated blood loss: Minimal  Preparation: ChloraPrep  Indications: Pt is a 70 yo white female with primary hyperparathyroidism.  Calcium level is elevated at 10.7.  Intact PTH elevated at 93.  Nuclear med sestamibi scan localizes an adenoma to the left superior position.  Procedure: Patient was prepared in the holding area. He was brought to operating room and placed in a supine position on the operating room table. Following administration of general anesthesia, the patient was positioned and then prepped and draped in the usual strict aseptic fashion. After ascertaining that an adequate level of anesthesia been achieved, a neck incision is made with a #15 blade. Dissection was carried through subcutaneous tissues and platysma. Hemostasis was obtained with the electrocautery. Skin flaps were developed circumferentially and a Weitlander retractor was placed for exposure.  Strap muscles were incised in the midline. Strap muscles were reflected exposing the thyroid lobe. With gentle blunt dissection the thyroid lobe was mobilized.  Dissection was carried through adipose tissue and an enlarged parathyroid gland is identified. It is gently mobilized. Vascular structures are divided between small and medium ligaclips. Care is taken to avoid the recurrent laryngeal nerve and the esophagus. Gland is completely excised. It is submitted to pathology. Frozen section confirms parathyroid tissue consistent with adenoma.  Neck is irrigated with warm saline. Good hemostasis is noted. Surgicel is placed in the operative field. Strap muscles are reapproximated in the midline with interrupted 3-0 Vicryl sutures. Platysma was closed with interrupted 3-0 Vicryl sutures.  Skin is closed with a running 4-0 Monocryl subcuticular suture. Local anesthetic is infiltrated circumferentially. Wound was washed and dried and benzoin and Steri-Strips are applied. Sterile gauze dressing is applied. Patient is awakened from anesthesia and brought to the recovery room. The patient tolerated the procedure well.   Velora Heckler, MD, FACS General & Endocrine Surgery [facility]

## 2011-03-12 NOTE — Progress Notes (Signed)
Ambulated around unit to go to bathroom and tolerated well.

## 2011-03-12 NOTE — Progress Notes (Signed)
Faxed to [facility] 

## 2011-03-12 NOTE — Interval H&P Note (Signed)
History and Physical Interval Note:  03/12/2011 1:31 PM  Pamela Snow  has presented today for surgery, with the diagnosis of primary hyperparathyroidism.  The various methods of treatment have been discussed with the patient and family. After consideration of risks, benefits and other options for treatment, the patient has consented to  Procedure(s):  PARATHYROIDECTOMY as a surgical intervention.  The patients' history has been reviewed, patient examined, no change in status, stable for surgery.  I have reviewed the patients' chart and labs.  Questions were answered to the patient's satisfaction.    Velora Heckler, MD, FACS General & Endocrine Surgery [facility]  Ramelo Oetken Judie Petit

## 2011-03-12 NOTE — H&P (View-Only) (Signed)
Progress Notes   Pamela Snow (MR# [id number])       Progress Notes     Chief Complaint   Patient presents with   .  Parathyroid Adenoma       primary hyperparathyroidism - referral from Dr. Timothy Lasso        HISTORY: Patient is a 70 year old female seen at the request of her primary care physician for suspected primary hyperparathyroidism. Patient was admitted to the hospital in early August for unrelenting nausea, vomiting, and headache. She was noted to have significant hypertension. A medical and neurological evaluation is ongoing. Laboratory studies in early August demonstrated an elevated intact PTH level of 93. Calcium levels have ranged from 10.5-10.7. However a repeat intact PTH level was normal at 31. Patient subsequently underwent nuclear medicine parathyroid scanning which localized an abnormal parathyroid to the left superior position consistent with a parathyroid adenoma. Patient is now referred for consideration for minimally invasive parathyroidectomy.   Patient has had no prior head or neck surgery. She does have hypothyroidism and has taken thyroid hormone replacement for years. There is no other history of endocrinopathy and the patient or her family. She has had bone density scanning which is normal. She has not had a 24-hour urine collection for calcium. She did have a vitamin D level which was slightly low at 27.    Past Medical History   Diagnosis  Date   .  Hiatal hernia     .  Diverticulosis     .  Constipation     .  GERD (gastroesophageal reflux disease)     .  Arthritis     .  Hypertension     .  Generalized headaches     .  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  Weakness             Current Outpatient Prescriptions   Medication  Sig  Dispense  Refill   .  ALPRAZolam (XANAX) 0.5 MG tablet  Take 1 tablet (0.5 mg total) by mouth 3 (three) times daily as needed for sleep or anxiety.   90 tablet   0   .  escitalopram (LEXAPRO) 10 MG tablet  Take 10 mg by mouth  daily.           Marland Kitchen  esomeprazole (NEXIUM) 40 MG capsule  Take 40 mg by mouth daily before breakfast.           .  Eszopiclone (LUNESTA PO)  Take by mouth as needed. Patient alternates between this and the Ambien in usage.          Marland Kitchen  levothyroxine (SYNTHROID, LEVOTHROID) 50 MCG tablet  Take 50 mcg by mouth daily.           Marland Kitchen  lisinopril (PRINIVIL,ZESTRIL) 10 MG tablet  Take 10 mg by mouth daily.           .  polyethylene glycol powder (GLYCOLAX/MIRALAX) powder  Take 17 g by mouth daily. Nightly          .  Probiotic Product (PHILLIPS COLON HEALTH) CAPS  Take 1 capsule by mouth daily.   30 capsule   0   .  verapamil (CALAN) 120 MG tablet  Take 120 mg by mouth 2 (two) times daily.           Marland Kitchen  zolpidem (AMBIEN) 5 MG tablet  Take 5 mg by mouth as needed.           Marland Kitchen  CALCIUM-VITAMIN D PO  Take by mouth.           .  mesalamine (LIALDA) 1.2 G EC tablet  Take two tablets by mouth once a day   60 tablet   3   .  rifaximin (XIFAXAN) 550 MG TABS  Take 1 tablet (550 mg total) by mouth 2 (two) times daily.   60 tablet   0           Allergies   Allergen  Reactions   .  Penicillins  Hives and Other (See Comments)       Thrush           Family History   Problem  Relation  Age of Onset   .  Colon cancer  Mother         and sister    .  Cancer  Mother         colon   .  Heart disease  Mother         congestive heart failure   .  Colon cancer  Sister     .  Cancer  Sister         colon   .  Heart disease  Father         heart attack following surgery           History       Social History   .  Marital Status:  Married       Spouse Name:  N/A       Number of Children:  3   .  Years of Education:  N/A       Occupational History   .  retired         Social History Main Topics   .  Smoking status:  Former Games developer   .  Smokeless tobacco:  Never Used   .  Alcohol Use:  Yes         weekends    .  Drug Use:  No   .  Sexually Active:  None       Other Topics  Concern   .   None       Social History Narrative   .  None          REVIEW OF SYSTEMS - PERTINENT POSITIVES ONLY: Patient notes persistent headaches daily requiring narcotic analgesics area patient notes occasional nausea. Patient notes persistent hypertension. Patient does complain of significant fatigue. She denies nephrolithiasis.     EXAM: Filed Vitals:     12/02/10 1032   BP:  108/72   Pulse:  70   Temp:  96.7 F (35.9 C)        HEENT:           normocephalic; pupils equal and reactive; sclerae clear; dentition good; mucous membranes moist NECK:             ; symmetric on extension; no palpable anterior or posterior cervical lymphadenopathy; no supraclavicular masses; no tenderness CHEST:           clear to auscultation bilaterally without rales, rhonchi, or wheezes CARDIAC:       regular rate and rhythm without significant murmur; peripheral pulses are full EXT:                non-tender without edema; no deformity NEURO:          no gross focal deficits; no sign of tremor  LABORATORY RESULTS: See E-Chart for most recent results     RADIOLOGY RESULTS: See E-Chart or I-Site for most recent results     IMPRESSION: Hypercalcemia, 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     PLAN: Patient is currently undergoing a neurological evaluation. Several studies are scheduled in the next week. We will await the results of these studies before proceeding with any surgical intervention.   I would like to obtain a 24-hour urine collection for calcium. That will be done this week.   Patient does have a low vitamin D level, and I will request of her primary physician initiate appropriate treatment.   The patient and her husband and I had a lengthy discussion regarding all of the above issues. We discussed minimally invasive parathyroidectomy. We discussed potential complications, specifically the risk of recurrent laryngeal nerve  injury with alteration in voice quality. We discussed performing this as an outpatient procedure. We discussed the possibility of a second gland adenoma and the potential need for additional surgery. They understand and wish to schedule her thyroid surgery at some point in the near future once her neurologic assessment is complete.   The risks and benefits of the procedure have been discussed at length with the patient.  The patient understands the proposed procedure, potential alternative treatments, and the course of recovery to be expected.  All of the patient's questions have been answered at this time.  The patient wishes to proceed with surgery and will schedule a date for their procedure through our office staff.     Velora Heckler, MD, FACS General & Endocrine Surgery [facility]           Visit Diagnoses: 1.  Hyperparathyroidism, primary         Primary Care Physician: [name], MD           Progress Notes Info       Author Note Status Last Update User Last Update Date/Time    Velora Heckler, MD Signed Velora Heckler, MD 12/02/2010 11:35 AM

## 2011-03-12 NOTE — Anesthesia Procedure Notes (Addendum)
Procedure Name: Intubation Date/Time: 03/12/2011 2:05 PM Performed by: [place], Johnye Kist C Pre-anesthesia Checklist: Patient identified, Emergency Drugs available, Suction available and Patient being monitored Patient Re-evaluated:Patient Re-evaluated prior to inductionOxygen Delivery Method: Circle System Utilized Preoxygenation: Pre-oxygenation with 100% oxygen Intubation Type: IV induction and Inhalational induction Ventilation: Mask ventilation without difficulty Laryngoscope Size: Miller and 2 Grade View: Grade I Tube type: Oral Tube size: 7.0 mm Number of attempts: 1 Airway Equipment and Method: stylet Placement Confirmation: ETT inserted through vocal cords under direct vision,  positive ETCO2,  CO2 detector and breath sounds checked- equal and bilateral Secured at: 21 cm Tube secured with: Tape Dental Injury: Teeth and Oropharynx as per pre-operative assessment  Comments: Intubation by Jerrye Beavers, SRNA

## 2011-03-15 ENCOUNTER — Encounter (HOSPITAL_COMMUNITY): Payer: Self-pay | Admitting: Surgery

## 2011-03-15 ENCOUNTER — Telehealth (INDEPENDENT_AMBULATORY_CARE_PROVIDER_SITE_OTHER): Payer: Self-pay | Admitting: Surgery

## 2011-03-16 ENCOUNTER — Telehealth (INDEPENDENT_AMBULATORY_CARE_PROVIDER_SITE_OTHER): Payer: Self-pay | Admitting: Surgery

## 2011-03-17 ENCOUNTER — Other Ambulatory Visit (INDEPENDENT_AMBULATORY_CARE_PROVIDER_SITE_OTHER): Payer: Self-pay | Admitting: Surgery

## 2011-03-17 DIAGNOSIS — E892 Postprocedural hypoparathyroidism: Secondary | ICD-10-CM

## 2011-03-17 DIAGNOSIS — Z8719 Personal history of other diseases of the digestive system: Secondary | ICD-10-CM | POA: Insufficient documentation

## 2011-03-17 DIAGNOSIS — Z9889 Other specified postprocedural states: Secondary | ICD-10-CM

## 2011-03-17 NOTE — Progress Notes (Signed)
I have reviewed the colonoscopy reports provided by Dr. Sheryn Bison.  Patient will not require barium enema study.  Will proceed with scheduling patient for surgery for sigmoid colectomy for management of recurrent episodes of acute diverticulitis involving the sigmoid colon.  Orders written. tmg

## 2011-03-19 ENCOUNTER — Encounter: Payer: Self-pay | Admitting: Gastroenterology

## 2011-03-22 NOTE — H&P (Signed)
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  BRIEF HISTORY:  Patient is a 70 year old female, referred by her primary physician with suspected primary hyperparathyroidism.  Patient underwent an extensive workup including both medical and neurological evaluation. Laboratory studies showed an elevated intact PTH level of 93.  Serum calcium levels have ranged from 10.5-10.7.  Nuclear medicine sestamibi scan localized an abnormal signal to the left superior position consistent with parathyroid adenoma.  Patient is now brought to Surgery for parathyroidectomy.  PAST MEDICAL HISTORY: 1. History of gastroesophageal reflux disease with hiatal hernia. 2. History of intermittent episodes of diverticulitis. 3. History of hypertension. 4. History of hearing loss.  MEDICATIONS:  Xanax, Lexapro, Nexium, Lunesta, Synthroid, Prinivil, Calan, Ambien, Lialda.  ALLERGIES:  PENICILLIN causing hives.  SOCIAL HISTORY:  Patient is married with 3 children.  She quit smoking. She notes moderate alcohol use.  FAMILY HISTORY:  Notable for colon cancer in the patient's mother, cardiac disease in the patient's mother, cardiac disease in the patient's father.  REVIEW OF SYSTEMS:  15-system review documented in the medical record.  EXAM:  HEENT:  Shows patient to be normocephalic.  Pupils equal and reactive.  Dentition good.  Mucous membranes moist.  NECK:  Symmetrical on extension.  Palpation shows no thyroid nodularity, no lymphadenopathy, no mass, no tenderness. LUNGS:  Clear to auscultation bilaterally without rales,  rhonchi, or wheeze. CARDIAC EXAM:  Shows regular rate and rhythm without murmur.  Peripheral pulses are full. EXTREMITIES:  Nontender without edema.  No deformity.  No tenderness. NEUROLOGIC:  There are no gross focal neurologic deficits.  There is no sign of tremor.  LABORATORY STUDIES:  See e-chart for most recent results.  RADIOGRAPHIC STUDIES:  See e-chart for most recent results.  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  PLAN:  Patient is admitted on December 7 to undergo minimally invasive parathyroidectomy.  Risks and benefits of the procedure have been discussed at length with the patient.  She is planning for outpatient surgery.  In the event of a four-gland exploration, the patient will require overnight observation.  She and her husband understand and wish to proceed.   Velora Heckler, MD, FACS     TMG/MEDQ  D:  03/22/2011  T:  03/22/2011  Job:  [id number]  cc:   [name], MD Fax: [phone]

## 2011-03-25 ENCOUNTER — Telehealth: Payer: Self-pay | Admitting: Gastroenterology

## 2011-03-25 MED ORDER — DOXYCYCLINE HYCLATE 100 MG PO TABS: ORAL_TABLET | ORAL | Status: DC

## 2011-03-25 NOTE — Telephone Encounter (Signed)
Pt with hx of CDIFF requiring hospitalization at least twice, called to report she's been having LLQ pain and discomfort for the last 3-5 days. She is tender in the area, but it's not as sore and painful as it usually gets. She denies diarrhea, but has pressure like she needs a BM; she hasn't seen any blood. Per Dr Jarold Motto, order what Mike Gip, PA gave her last time. Spoke with Amy and we gave her Doxycycline 100mg  po daily x 7 days. Informed pt of script and to call if s&s worsen; pt stated understanding.

## 2011-03-26 ENCOUNTER — Telehealth: Payer: Self-pay | Admitting: Gastroenterology

## 2011-03-26 MED ORDER — HYDROCODONE-ACETAMINOPHEN 5-500 MG PO TABS: ORAL_TABLET | ORAL | Status: DC

## 2011-03-26 NOTE — Telephone Encounter (Signed)
LOW FIBER DIET AND PERCODAN IS FINE..QHS MIRALAX WILL BE NEEDED ALSO

## 2011-03-26 NOTE — Telephone Encounter (Signed)
Called and spoke with Dr. Ardine Eng nurse Kendell Bane. She is going to discuss with him and call me back.

## 2011-03-26 NOTE — Telephone Encounter (Signed)
Rx sent. Patient given recommendations per MD.

## 2011-03-26 NOTE — Telephone Encounter (Signed)
Patient with hx of diverticulitis waiting on sigmoid resection with Dr. Gerrit Friends. Patient called in yesterday and spoke with Aram Beecham about LLQ pain and tenderness that is not as bad as in the past but has been going on for 3-5 days. She was started on Doxycyline 100 mg daily x 7 days. She took this yesterday. Calling today to report she does not feel any better at all. The pain is not a lot worse but not better. States she took Vicodin last night for pain. She is worried about the weekend coming up and still having pain. She is suppose to go to [facility] for family Christmas on Sunday.she wants to know a "plan" if she is not feeling better over the weekend; Please, advise.

## 2011-03-26 NOTE — Telephone Encounter (Signed)
Spoke with Pamela Snow and Dr. Gerrit Friends and he does not have any new orders or suggestions. They will call her on Monday and see how she is doing. Called and spoke with patient. She understands to call us back for fever or increase in pain. She understands if she gets worse in [facility], she may need to see care there.

## 2011-03-26 NOTE — Telephone Encounter (Signed)
We have done all we can do from a medical standpoint. Please call the office of Dr.Gerkin let them know she is having further diverticulitis problems. Surgical intervention is needed as previously requested.Pamela KitchenMarland KitchenMarland Snow

## 2011-03-26 NOTE — Telephone Encounter (Signed)
Patient called back and asked if Dr. Jarold Motto will call in Vicodin for pain for her at [facility] on [address]. Please, advise.

## 2011-03-31 ENCOUNTER — Telehealth: Payer: Self-pay | Admitting: Gastroenterology

## 2011-03-31 NOTE — Telephone Encounter (Signed)
Patient has decided to go ahead and have her surgery at [facility] she will be having surgery on Friday she has already called [facility] and left a VM on [facility] VM.

## 2011-04-02 ENCOUNTER — Encounter (INDEPENDENT_AMBULATORY_CARE_PROVIDER_SITE_OTHER): Payer: Self-pay

## 2011-04-06 HISTORY — PX: LAPAROSCOPIC COLON RESECTION: SUR791

## 2011-04-08 ENCOUNTER — Other Ambulatory Visit (INDEPENDENT_AMBULATORY_CARE_PROVIDER_SITE_OTHER): Payer: Self-pay | Admitting: Surgery

## 2011-04-08 DIAGNOSIS — Z9889 Other specified postprocedural states: Secondary | ICD-10-CM | POA: Diagnosis not present

## 2011-04-09 DIAGNOSIS — K5732 Diverticulitis of large intestine without perforation or abscess without bleeding: Secondary | ICD-10-CM | POA: Diagnosis not present

## 2011-04-09 DIAGNOSIS — K219 Gastro-esophageal reflux disease without esophagitis: Secondary | ICD-10-CM | POA: Diagnosis present

## 2011-04-09 DIAGNOSIS — K5730 Diverticulosis of large intestine without perforation or abscess without bleeding: Secondary | ICD-10-CM | POA: Diagnosis not present

## 2011-04-09 DIAGNOSIS — I10 Essential (primary) hypertension: Secondary | ICD-10-CM | POA: Diagnosis present

## 2011-04-09 DIAGNOSIS — F411 Generalized anxiety disorder: Secondary | ICD-10-CM | POA: Diagnosis present

## 2011-04-09 DIAGNOSIS — E039 Hypothyroidism, unspecified: Secondary | ICD-10-CM | POA: Diagnosis present

## 2011-04-09 DIAGNOSIS — B370 Candidal stomatitis: Secondary | ICD-10-CM | POA: Diagnosis not present

## 2011-04-09 LAB — CALCIUM: Calcium: 9.7 mg/dL (ref 8.4–10.5)

## 2011-04-12 ENCOUNTER — Encounter (INDEPENDENT_AMBULATORY_CARE_PROVIDER_SITE_OTHER): Payer: Medicare Other | Admitting: Surgery

## 2011-04-13 ENCOUNTER — Ambulatory Visit: Payer: Medicare Other | Admitting: Gastroenterology

## 2011-04-22 ENCOUNTER — Inpatient Hospital Stay (HOSPITAL_COMMUNITY): Admission: RE | Admit: 2011-04-22 | Payer: Medicare Other | Source: Ambulatory Visit | Admitting: Surgery

## 2011-04-22 ENCOUNTER — Encounter (HOSPITAL_COMMUNITY): Admission: RE | Payer: Self-pay | Source: Ambulatory Visit

## 2011-04-22 DIAGNOSIS — Z09 Encounter for follow-up examination after completed treatment for conditions other than malignant neoplasm: Secondary | ICD-10-CM | POA: Diagnosis not present

## 2011-04-22 DIAGNOSIS — Z938 Other artificial opening status: Secondary | ICD-10-CM | POA: Diagnosis not present

## 2011-04-22 DIAGNOSIS — K5730 Diverticulosis of large intestine without perforation or abscess without bleeding: Secondary | ICD-10-CM | POA: Diagnosis not present

## 2011-04-22 SURGERY — COLECTOMY, PARTIAL
Anesthesia: General

## 2011-05-10 DIAGNOSIS — Z0000 Encounter for general adult medical examination without abnormal findings: Secondary | ICD-10-CM | POA: Diagnosis not present

## 2011-05-10 DIAGNOSIS — E209 Hypoparathyroidism, unspecified: Secondary | ICD-10-CM | POA: Diagnosis not present

## 2011-05-10 DIAGNOSIS — E039 Hypothyroidism, unspecified: Secondary | ICD-10-CM | POA: Diagnosis not present

## 2011-05-17 DIAGNOSIS — Z0000 Encounter for general adult medical examination without abnormal findings: Secondary | ICD-10-CM | POA: Diagnosis not present

## 2011-05-17 DIAGNOSIS — R51 Headache: Secondary | ICD-10-CM | POA: Diagnosis not present

## 2011-05-17 DIAGNOSIS — E210 Primary hyperparathyroidism: Secondary | ICD-10-CM | POA: Diagnosis not present

## 2011-07-15 ENCOUNTER — Other Ambulatory Visit (INDEPENDENT_AMBULATORY_CARE_PROVIDER_SITE_OTHER): Payer: Medicare Other

## 2011-07-15 ENCOUNTER — Other Ambulatory Visit: Payer: Self-pay | Admitting: Gastroenterology

## 2011-07-15 DIAGNOSIS — R109 Unspecified abdominal pain: Secondary | ICD-10-CM | POA: Diagnosis not present

## 2011-07-15 LAB — CBC WITH DIFFERENTIAL/PLATELET
Basophils Absolute: 0 10*3/uL (ref 0.0–0.1)
Basophils Relative: 0.3 % (ref 0.0–3.0)
Eosinophils Absolute: 0.1 10*3/uL (ref 0.0–0.7)
Eosinophils Relative: 2.6 % (ref 0.0–5.0)
HCT: 37.6 % (ref 36.0–46.0)
Hemoglobin: 12.4 g/dL (ref 12.0–15.0)
Lymphocytes Relative: 26.1 % (ref 12.0–46.0)
Lymphs Abs: 1.4 10*3/uL (ref 0.7–4.0)
MCHC: 33 g/dL (ref 30.0–36.0)
MCV: 93.3 fl (ref 78.0–100.0)
Monocytes Absolute: 0.6 10*3/uL (ref 0.1–1.0)
Monocytes Relative: 10 % (ref 3.0–12.0)
Neutro Abs: 3.3 10*3/uL (ref 1.4–7.7)
Neutrophils Relative %: 61 % (ref 43.0–77.0)
Platelets: 325 10*3/uL (ref 150.0–400.0)
RBC: 4.03 Mil/uL (ref 3.87–5.11)
RDW: 13.5 % (ref 11.5–14.6)
WBC: 5.5 10*3/uL (ref 4.5–10.5)

## 2011-07-15 LAB — SEDIMENTATION RATE: Sed Rate: 14 mm/hr (ref 0–22)

## 2011-07-16 ENCOUNTER — Other Ambulatory Visit (INDEPENDENT_AMBULATORY_CARE_PROVIDER_SITE_OTHER): Payer: Medicare Other

## 2011-07-16 ENCOUNTER — Ambulatory Visit (INDEPENDENT_AMBULATORY_CARE_PROVIDER_SITE_OTHER)
Admission: RE | Admit: 2011-07-16 | Discharge: 2011-07-16 | Disposition: A | Discharge status: Home / Self Care | Payer: Medicare Other | Source: Ambulatory Visit | Attending: Gastroenterology | Admitting: Gastroenterology

## 2011-07-16 ENCOUNTER — Ambulatory Visit (INDEPENDENT_AMBULATORY_CARE_PROVIDER_SITE_OTHER): Payer: Medicare Other | Admitting: Gastroenterology

## 2011-07-16 ENCOUNTER — Encounter: Payer: Self-pay | Admitting: Gastroenterology

## 2011-07-16 DIAGNOSIS — R109 Unspecified abdominal pain: Secondary | ICD-10-CM

## 2011-07-16 DIAGNOSIS — N269 Renal sclerosis, unspecified: Secondary | ICD-10-CM | POA: Diagnosis not present

## 2011-07-16 DIAGNOSIS — R1031 Right lower quadrant pain: Secondary | ICD-10-CM | POA: Diagnosis not present

## 2011-07-16 LAB — BASIC METABOLIC PANEL
BUN: 18 mg/dL (ref 6–23)
CO2: 26 mEq/L (ref 19–32)
Calcium: 9.8 mg/dL (ref 8.4–10.5)
Chloride: 107 mEq/L (ref 96–112)
Creatinine, Ser: 1.1 mg/dL (ref 0.4–1.2)
GFR: 55.23 mL/min — ABNORMAL LOW (ref >60.00)
Glucose, Bld: 87 mg/dL (ref 70–99)
Potassium: 4.8 mEq/L (ref 3.5–5.1)
Sodium: 140 mEq/L (ref 135–145)

## 2011-07-16 MED ORDER — HYDROCODONE-ACETAMINOPHEN 5-500 MG PO TABS: 1.0000 | ORAL_TABLET | ORAL | Status: DC | PRN

## 2011-07-16 MED ORDER — IOHEXOL 300 MG/ML  SOLN
100.0000 mL | Freq: Once | INTRAMUSCULAR | Status: AC | PRN
  Administered 2011-07-16: 100 mL via INTRAVENOUS

## 2011-07-16 NOTE — Patient Instructions (Addendum)
Please go to the basement today for your labs.  Vicodin rx #[id number] faxed to pharmacy   You have been scheduled for a CT scan of the abdomen and pelvis at [facility] ([address]---this is in the same building as Architectural technologist).   You are scheduled on 07/16/2011 at 4:00pm. You should arrive 15 minutes prior to your appointment time for registration. Please follow the written instructions below on the day of your exam:  WARNING: IF YOU ARE ALLERGIC TO IODINE/X-RAY DYE, PLEASE NOTIFY RADIOLOGY IMMEDIATELY AT [phone]! YOU WILL BE GIVEN A 13 HOUR PREMEDICATION PREP.  1) Do not eat or drink anything after NOON (4 hours prior to your test) 2) You have been given 2 bottles of oral contrast to drink. The solution may taste better if refrigerated, but do NOT add ice or any other liquid to this solution. Shake well before drinking.    Drink 1 bottle of contrast @ 2:00pm (2 hours prior to your exam)  Drink 1 bottle of contrast @ 3:00pm (1 hour prior to your exam)  You may take any medications as prescribed with a small amount of water except for the following: Metformin, Glucophage, Glucovance, Avandamet, Riomet, Fortamet, Actoplus Met, Janumet, Glumetza or Metaglip. The above medications must be held the day of the exam AND 48 hours after the exam.  The purpose of you drinking the oral contrast is to aid in the visualization of your intestinal tract. The contrast solution may cause some diarrhea. Before your exam is started, you will be given a small amount of fluid to drink. Depending on your individual set of symptoms, you may also receive an intravenous injection of x-ray contrast/dye. Plan on being at [facility] for 30 minutes or long, depending on the type of exam you are having performed.  If you have any questions regarding your exam or if you need to reschedule, you may call the CT department at [phone] between the hours of 8:00 am and 5:00 pm,  Monday-Friday.  ________________________________________________________________________

## 2011-07-16 NOTE — Progress Notes (Signed)
This is a 71 year old Caucasian female who presents with acute onset of right lower quadrant discomfort without nausea and vomiting, change in bowel habits, or systemic complaints. She relates that the pain is so severe that she cannot walk, and is best in the recumbent position. There no other precipitating or alleviating elements to her pain. She specifically denies genitourinary complaints. This patient had a long history of recurrent diverticulitis and underwent laparoscopic left hemicolectomy at [facility] in December. She did well postoperatively until this pain has returned without any real precipitating events. Review her records does show that the patient has been on frequent narcotics for many months. She denies abuse of NSAIDs or cigarettes and continues to use alcohol socially. She has had previous upper bowel ultrasound then which is shown no evidence of cholelithiasis. Review of previous CT scan showed his as she does have a mobile cecum, but otherwise no abnormalities have been detected. She has a strong family history of colon cancer in his head multiple colonoscopies. Lab data from yesterday shows entirely normal CBC and sed rate.  Current Medications, Allergies, Past Medical History, Past Surgical History, Family History and Social History were reviewed in [employer] record.  Pertinent Review of Systems Negative   Physical Exam: Depressed-appearing patient in no acute distress. Blood pressure 120/74 pulse 60 and regular. Her abdomen is flat, not distended, there is no organomegaly, masses, and subjective tenderness in the right lower quadrant area. I cannot appreciate any definite herniation or other abnormalities. Bowel sounds are normal. Rectal exam shows no masses or tenderness with guaiac negative normal colored normal shape stool.    Assessment and Plan: Unusual presentation in a patient who has had multiple relapsing episodes of abdominal pain and documented  diverticulitis who is status post laparoscopic surgery several months ago. Her pain currently seems to be much greater than any objective on physical exam or laboratory testing. I have scheduled Repeat CT scan of the abdomen and pelvis to exclude subacute appendicitis. If this is negative, we need to think further about possible pain medication dependency. She did request another prescription today for Vicodin, and I gave her 30 pills with no refill. Should continue her other medications as listed and reviewed. This patient also has had recent surgery for parathyroid adenoma. Robert Russo/ [facility]. Actually on review of her CT her appendix appeared to be in the right upper quadrant area of her abdomen. There been no previous pelvic abnormalities noted on CT scan. Encounter Diagnosis  Name Primary?  . Abdominal pain Yes

## 2011-07-23 ENCOUNTER — Telehealth: Payer: Self-pay | Admitting: *Deleted

## 2011-07-23 NOTE — Telephone Encounter (Signed)
Spoke with pt who stated she is doing great! She thanked [place] and will call for further problems.

## 2011-07-23 NOTE — Telephone Encounter (Signed)
Per Dr Jarold Motto, check on how pt is doing; if no better, she needs a COLON. lmom for pt to call back.

## 2011-08-02 DIAGNOSIS — E210 Primary hyperparathyroidism: Secondary | ICD-10-CM | POA: Diagnosis not present

## 2011-08-02 DIAGNOSIS — I10 Essential (primary) hypertension: Secondary | ICD-10-CM | POA: Diagnosis not present

## 2011-08-02 DIAGNOSIS — R51 Headache: Secondary | ICD-10-CM | POA: Diagnosis not present

## 2011-08-02 DIAGNOSIS — R259 Unspecified abnormal involuntary movements: Secondary | ICD-10-CM | POA: Diagnosis not present

## 2011-10-11 ENCOUNTER — Other Ambulatory Visit: Payer: Self-pay | Admitting: Gynecology

## 2011-10-11 DIAGNOSIS — Z1231 Encounter for screening mammogram for malignant neoplasm of breast: Secondary | ICD-10-CM

## 2011-10-14 DIAGNOSIS — N8184 Pelvic muscle wasting: Secondary | ICD-10-CM | POA: Diagnosis not present

## 2011-10-14 DIAGNOSIS — E039 Hypothyroidism, unspecified: Secondary | ICD-10-CM | POA: Diagnosis not present

## 2011-11-01 ENCOUNTER — Ambulatory Visit
Admission: RE | Admit: 2011-11-01 | Discharge: 2011-11-01 | Disposition: A | Discharge status: Home / Self Care | Payer: Medicare Other | Source: Ambulatory Visit | Attending: Gynecology | Admitting: Gynecology

## 2011-11-01 DIAGNOSIS — Z1231 Encounter for screening mammogram for malignant neoplasm of breast: Secondary | ICD-10-CM | POA: Diagnosis not present

## 2011-11-15 DIAGNOSIS — I10 Essential (primary) hypertension: Secondary | ICD-10-CM | POA: Diagnosis not present

## 2011-11-15 DIAGNOSIS — R51 Headache: Secondary | ICD-10-CM | POA: Diagnosis not present

## 2011-11-15 DIAGNOSIS — R259 Unspecified abnormal involuntary movements: Secondary | ICD-10-CM | POA: Diagnosis not present

## 2011-11-15 DIAGNOSIS — E210 Primary hyperparathyroidism: Secondary | ICD-10-CM | POA: Diagnosis not present

## 2011-11-16 DIAGNOSIS — E039 Hypothyroidism, unspecified: Secondary | ICD-10-CM | POA: Diagnosis not present

## 2011-11-16 DIAGNOSIS — K219 Gastro-esophageal reflux disease without esophagitis: Secondary | ICD-10-CM | POA: Diagnosis not present

## 2011-11-16 DIAGNOSIS — Z23 Encounter for immunization: Secondary | ICD-10-CM | POA: Diagnosis not present

## 2011-11-16 DIAGNOSIS — R51 Headache: Secondary | ICD-10-CM | POA: Diagnosis not present

## 2011-11-16 DIAGNOSIS — R030 Elevated blood-pressure reading, without diagnosis of hypertension: Secondary | ICD-10-CM | POA: Diagnosis not present

## 2011-12-15 DIAGNOSIS — R030 Elevated blood-pressure reading, without diagnosis of hypertension: Secondary | ICD-10-CM | POA: Diagnosis not present

## 2011-12-17 DIAGNOSIS — R030 Elevated blood-pressure reading, without diagnosis of hypertension: Secondary | ICD-10-CM | POA: Diagnosis not present

## 2012-05-01 DIAGNOSIS — R1031 Right lower quadrant pain: Secondary | ICD-10-CM | POA: Diagnosis not present

## 2012-05-08 ENCOUNTER — Ambulatory Visit
Admission: RE | Admit: 2012-05-08 | Discharge: 2012-05-08 | Disposition: A | Discharge status: Home / Self Care | Payer: Medicare Other | Source: Ambulatory Visit | Attending: Internal Medicine | Admitting: Internal Medicine

## 2012-05-08 ENCOUNTER — Other Ambulatory Visit: Payer: Self-pay | Admitting: Internal Medicine

## 2012-05-08 DIAGNOSIS — R109 Unspecified abdominal pain: Secondary | ICD-10-CM | POA: Diagnosis not present

## 2012-05-08 DIAGNOSIS — R1031 Right lower quadrant pain: Secondary | ICD-10-CM

## 2012-05-08 DIAGNOSIS — R1909 Other intra-abdominal and pelvic swelling, mass and lump: Secondary | ICD-10-CM | POA: Diagnosis not present

## 2012-05-08 MED ORDER — IOHEXOL 300 MG/ML  SOLN
100.0000 mL | Freq: Once | INTRAMUSCULAR | Status: AC | PRN
  Administered 2012-05-08: 100 mL via INTRAVENOUS

## 2012-05-10 ENCOUNTER — Telehealth: Payer: Self-pay | Admitting: Gastroenterology

## 2012-05-10 NOTE — Telephone Encounter (Signed)
Pt reports a week ago she passed a Kidney Stone per CT scan. Ever since she's had pain in her stomach, lower abdomen and at her right rib cage. She doesn't know if she suffered trauma to her abdomen d/t the stone or what. She reports her stools are more loose, but she's increased her water d/t the stone. She is having 3 stools a day instead of 2 which is normal and she remains on Miralax since her colectomy. She stopped Yoga and Spin classes this week because of the discomfort. She is going to [place] next week and doesn't want to get sick out there. Pt will see Dr Jarold Motto tomorrow. Called [facility] for records to be faxed.

## 2012-05-10 NOTE — Telephone Encounter (Signed)
lmom for pt to call back

## 2012-05-11 ENCOUNTER — Ambulatory Visit (INDEPENDENT_AMBULATORY_CARE_PROVIDER_SITE_OTHER): Payer: Medicare Other | Admitting: Gastroenterology

## 2012-05-11 ENCOUNTER — Encounter: Payer: Self-pay | Admitting: Gastroenterology

## 2012-05-11 VITALS — BP 124/70 | HR 68 | Ht 67.75 in

## 2012-05-11 DIAGNOSIS — R197 Diarrhea, unspecified: Secondary | ICD-10-CM | POA: Diagnosis not present

## 2012-05-11 DIAGNOSIS — R109 Unspecified abdominal pain: Secondary | ICD-10-CM

## 2012-05-11 DIAGNOSIS — Z800 Family history of malignant neoplasm of digestive organs: Secondary | ICD-10-CM

## 2012-05-11 DIAGNOSIS — Z842 Family history of other diseases of the genitourinary system: Secondary | ICD-10-CM

## 2012-05-11 DIAGNOSIS — Z9049 Acquired absence of other specified parts of digestive tract: Secondary | ICD-10-CM

## 2012-05-11 DIAGNOSIS — Z9889 Other specified postprocedural states: Secondary | ICD-10-CM

## 2012-05-11 MED ORDER — RIFAXIMIN 550 MG PO TABS: 550.0000 mg | ORAL_TABLET | Freq: Two times a day (BID) | ORAL | Status: DC

## 2012-05-11 MED ORDER — NA SULFATE-K SULFATE-MG SULF 17.5-3.13-1.6 GM/177ML PO SOLN: ORAL | Status: DC

## 2012-05-11 NOTE — Patient Instructions (Addendum)
You have been scheduled for a colonoscopy with propofol. Please follow written instructions given to you at your visit today.  Please pick up your prep kit at the pharmacy within the next 1-3 days. If you use inhalers (even only as needed) or a CPAP machine, please bring them with you on the day of your procedure.  We have sent the following medications to your pharmacy for you to pick up at your convenience: Xifaxan, please take as directed.  You have been scheduled for an abdominal ultrasound at [facility] (1st floor of hospital) on 05-16-12 at 9 am. Please arrive 15 minutes prior to your appointment for registration. Make certain not to have anything to eat or drink 6 hours prior to your appointment. Should you need to reschedule your appointment, please contact radiology at [phone]. This test typically takes about 30 minutes to perform.  Cc: Dr. Creola Corn

## 2012-05-11 NOTE — Progress Notes (Signed)
This is a 72 year old Caucasian female with a strong family history of colon cancer.  She's had recurrent diverticulitis for several years, and underwent laparoscopic sigmoid resection in [facility] in December 2012.Marland Kitchen  Recently she's had vague abdominal cramping discomfort which lasted several hours 2 weeks ago.  This acute pain has abated but she continues with chronic discomfort in her right lower quadrant, now right upper quadrant and left lower quadrant.  This is a 2/10 pain scale and described as a constant dull aching sensation.  Her bowels remain fairly normal with 2-3 loose stools a day without melena or hematochezia.  She denies any specific hepatobiliary or systemic complaints.  She recent saw her primary care physician and had normal labs and normal CT scan of the abdomen that was reviewed in detail today.  He did appear to show a large amount of contrast filled small bowel in the midabdomen.  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  Dr. Timothy Lasso apparently felt that she may have passed a kidney stone, and CT scan showed some possible slight dilatation of her right ureter.  In any case, urinalysis was unremarkable, and she denies a current genitourinary symptoms.  Her appetite is good her weight is stable.  There is no history of known gallbladder or liver disease.  The patient is ready to travel to [place] next week.  Last colonoscopy was several years ago.  Review of her records does show that she is on Nexium 40 mg a day for acid reflux, and when necessary hydrocodone.  No History of anorexia, weight loss, nausea vomiting, fever, chills, skin rashes or dysphagia.  She also denies excessive use of alcohol, cigarettes, or NSAIDs.  Current Medications, Allergies, Past Medical History, Past Surgical History, Family History and Social History were reviewed in [employer] record.  ROS: All systems were reviewed and are negative unless  otherwise stated in the HPI.          Physical Exam: Healthy-appearing patient somewhat depressed appearing but in no acute distress.  Pressure 04/28/68 and pulse 68 and regular.  Cannot appreciate stigmata of chronic liver disease.  Her abdomen shows no distention, organomegaly, masses or tenderness.  There is a well-healed transverse midline suprapubic scar. Bowel sounds are nonobstructive in nature.  Specks of rectum shows some perianal skin tags but no masses or tenderness.  There is soft stool present which is guaiac negative.  Mental status is normal.    Assessment and Plan: Suspected abdominal adhesions related to her previous diverticulitis and colon surgery.  Her pain distribution seems to be entirely over her colonic area, but I cannot appreciate any masses or tenderness or other abnormalities.Peggye Form scheduled her for followup colonoscopy, but also will check gallbladder ultrasound to be sure that she does not have cholelithiasis.  I've empirically placed her on Xifaxan 550 mg twice a day with daily align probiotic therapy.  This patient also has had previous hysterectomy and bilateral oophorectomy.  As mentioned above she does have a strong family history of colon cancer.  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  She has had previous surgeries hyperparathyroidism with removal of a benign parathyroid adenoma.  Serum calcium levels have been normal.  Review of her recent labs shows normal CBC, amylase,, and liver function tests.and urinalysis.  If her workup is negative and her pain continues, we may  need to consider urologic referral, also possibly pain clinic referral for pain management. Encounter Diagnoses  Name Primary?  . Diarrhea Yes  . Abdominal pain

## 2012-05-12 ENCOUNTER — Telehealth: Payer: Self-pay | Admitting: *Deleted

## 2012-05-12 NOTE — Telephone Encounter (Signed)
Patient called and left a message inquiring where her prescription was sent in.  I called patient on home and cell phone, left a message on both phones.

## 2012-05-15 ENCOUNTER — Encounter: Payer: Self-pay | Admitting: Gastroenterology

## 2012-05-15 ENCOUNTER — Ambulatory Visit (AMBULATORY_SURGERY_CENTER): Payer: Medicare Other | Admitting: Gastroenterology

## 2012-05-15 VITALS — BP 113/66 | HR 54 | Temp 98.1°F | Resp 16 | Ht 67.75 in | Wt 163.0 lb

## 2012-05-15 DIAGNOSIS — R109 Unspecified abdominal pain: Secondary | ICD-10-CM | POA: Diagnosis not present

## 2012-05-15 DIAGNOSIS — E213 Hyperparathyroidism, unspecified: Secondary | ICD-10-CM | POA: Diagnosis not present

## 2012-05-15 DIAGNOSIS — R1031 Right lower quadrant pain: Secondary | ICD-10-CM | POA: Diagnosis not present

## 2012-05-15 DIAGNOSIS — I10 Essential (primary) hypertension: Secondary | ICD-10-CM | POA: Diagnosis not present

## 2012-05-15 DIAGNOSIS — K5730 Diverticulosis of large intestine without perforation or abscess without bleeding: Secondary | ICD-10-CM | POA: Diagnosis not present

## 2012-05-15 DIAGNOSIS — G8929 Other chronic pain: Secondary | ICD-10-CM

## 2012-05-15 DIAGNOSIS — R197 Diarrhea, unspecified: Secondary | ICD-10-CM | POA: Diagnosis not present

## 2012-05-15 DIAGNOSIS — Z800 Family history of malignant neoplasm of digestive organs: Secondary | ICD-10-CM

## 2012-05-15 MED ORDER — SODIUM CHLORIDE 0.9 % IV SOLN: 500.0000 mL | INTRAVENOUS | Status: DC

## 2012-05-15 NOTE — Patient Instructions (Addendum)
YOU HAD AN ENDOSCOPIC PROCEDURE TODAY AT THE [facility]: Refer to the procedure report that was given to you for any specific questions about what was found during the examination.  If the procedure report does not answer your questions, please call your gastroenterologist to clarify.  If you requested that your care partner not be given the details of your procedure findings, then the procedure report has been included in a sealed envelope for you to review at your convenience later.  YOU SHOULD EXPECT: Some feelings of bloating in the abdomen. Passage of more gas than usual.  Walking can help get rid of the air that was put into your GI tract during the procedure and reduce the bloating. If you had a lower endoscopy (such as a colonoscopy or flexible sigmoidoscopy) you may notice spotting of blood in your stool or on the toilet paper. If you underwent a bowel prep for your procedure, then you may not have a normal bowel movement for a few days.  DIET: Your first meal following the procedure should be a light meal and then it is ok to progress to your normal diet.  A half-sandwich or bowl of soup is an example of a good first meal.  Heavy or fried foods are harder to digest and may make you feel nauseous or bloated.  Likewise meals heavy in dairy and vegetables can cause extra gas to form and this can also increase the bloating.  Drink plenty of fluids but you should avoid alcoholic beverages for 24 hours.   Increase the amount of fiber in your diet to prevent diverticulitis.  ACTIVITY: Your care partner should take you home directly after the procedure.  You should plan to take it easy, moving slowly for the rest of the day.  You can resume normal activity the day after the procedure however you should NOT DRIVE or use heavy machinery for 24 hours (because of the sedation medicines used during the test).     Thank-you for choosing [place] for your medical needs. SYMPTOMS TO REPORT  IMMEDIATELY: A gastroenterologist can be reached at any hour.  During normal business hours, 8:30 AM to 5:00 PM Monday through Friday, call [phone].  After hours and on weekends, please call the GI answering service at [phone] who will take a message and have the physician on call contact you.   Following lower endoscopy (colonoscopy or flexible sigmoidoscopy):  Excessive amounts of blood in the stool  Significant tenderness or worsening of abdominal pains  Swelling of the abdomen that is new, acute  Fever of 100F or higher  FOLLOW UP: If any biopsies were taken you will be contacted by phone or by letter within the next 1-3 weeks.  Call your gastroenterologist if you have not heard about the biopsies in 3 weeks.  Our staff will call the home number listed on your records the next business day following your procedure to check on you and address any questions or concerns that you may have at that time regarding the information given to you following your procedure. This is a courtesy call and so if there is no answer at the home number and we have not heard from you through the emergency physician on call, we will assume that you have returned to your regular daily activities without incident.  SIGNATURES/CONFIDENTIALITY: You and/or your care partner have signed paperwork which will be entered into your electronic medical record.  These signatures attest to the fact that that  the information above on your After Visit Summary has been reviewed and is understood.  Full responsibility of the confidentiality of this discharge information lies with you and/or your care-partner.

## 2012-05-15 NOTE — Op Note (Signed)
[facility] [address] [place], [address]   COLONOSCOPY PROCEDURE REPORT  PATIENT: Pamela Snow, Pamela Snow  MR#: [id number] BIRTHDATE: 1940/09/01 , 71  yrs. old GENDER: Female ENDOSCOPIST: Mardella Layman, MD, [facility] REFERRED BY: PROCEDURE DATE:  05/15/2012 PROCEDURE:   Colonoscopy, diagnostic ASA CLASS:   Class II INDICATIONS:history of abdominal pain of unexplained etiology after prior sigmoid colon resection.Marland Kitchen MEDICATIONS: Propofol (Diprivan) 270 mg IV  DESCRIPTION OF PROCEDURE:   After the risks and benefits and of the procedure were explained, informed consent was obtained.  A digital rectal exam revealed no abnormalities of the rectum.    The LB [id number]-[id number]  endoscope was introduced through the anus and advanced to the cecum, which was identified by both the appendix and ileocecal valve .  The quality of the prep was excellent, using MoviPrep .  The instrument was then slowly withdrawn as the colon was fully examined.     COLON FINDINGS: The colon was redundant.   There was a surgical anastomosis at 15 cm.  Visible staples were present but no inflammation or obstructivion of the anastomosis.  Pictures were obtained for documentation.  There were residual scattered left colon diverticula noted, but no evidence of diverticulitis. Colonoscope was then advanced to the cecum somewhat redundant colon.  There were no visible mucosal or polypoid lesions, inflammation or obstructiion of the colon.  Pictures were obtained for documentation.    There were no visible mucosal or polypoid lesions.     Retroflexed views revealed no abnormalities.     The scope was then withdrawn from the patient and the procedure completed.  COMPLICATIONS: There were no complications. ENDOSCOPIC IMPRESSION:prior left hemicolectomy performed laparoscopically.  There are some visible staples at the anastomosis, but no inflammation or obstruction.  Scattered residual  diverticula are present.  There is no evidence of colitis or obstruction.  Etiology of this patient's chronic abdominal pain is unclear.  She probably has adhesions related to previous diverticulitis.  RECOMMENDATIONS:  Pain clinic referral if no response to empiric Rx. for bacterial overgrowth syndrome.   REPEAT EXAM:  AO:ZHYQ Timothy Lasso, MD  _______________________________ eSigned:  Mardella Layman, MD, [facility] 05/15/2012 11:20 AM     PATIENT NAME:  [name], [name] MR#: [id number]

## 2012-05-15 NOTE — Progress Notes (Addendum)
Patient did not have preoperative order for IV antibiotic SSI prophylaxis. (G8918)  Patient did not experience any of the following events: a burn prior to discharge; a fall within the facility; wrong site/side/patient/procedure/implant event; or a hospital transfer or hospital admission upon discharge from the facility. (G8907)  

## 2012-05-16 ENCOUNTER — Telehealth: Payer: Self-pay | Admitting: Gastroenterology

## 2012-05-16 ENCOUNTER — Ambulatory Visit: Payer: Medicare Other | Admitting: Gastroenterology

## 2012-05-16 ENCOUNTER — Telehealth: Payer: Self-pay | Admitting: *Deleted

## 2012-05-16 ENCOUNTER — Ambulatory Visit (HOSPITAL_COMMUNITY)
Admission: RE | Admit: 2012-05-16 | Discharge: 2012-05-16 | Disposition: A | Discharge status: Home / Self Care | Payer: Medicare Other | Source: Ambulatory Visit | Attending: Gastroenterology | Admitting: Gastroenterology

## 2012-05-16 ENCOUNTER — Telehealth (INDEPENDENT_AMBULATORY_CARE_PROVIDER_SITE_OTHER): Payer: Self-pay | Admitting: General Surgery

## 2012-05-16 DIAGNOSIS — R109 Unspecified abdominal pain: Secondary | ICD-10-CM

## 2012-05-16 DIAGNOSIS — Q619 Cystic kidney disease, unspecified: Secondary | ICD-10-CM | POA: Insufficient documentation

## 2012-05-16 DIAGNOSIS — R197 Diarrhea, unspecified: Secondary | ICD-10-CM

## 2012-05-16 DIAGNOSIS — K8020 Calculus of gallbladder without cholecystitis without obstruction: Secondary | ICD-10-CM | POA: Diagnosis not present

## 2012-05-16 DIAGNOSIS — K219 Gastro-esophageal reflux disease without esophagitis: Secondary | ICD-10-CM | POA: Insufficient documentation

## 2012-05-16 DIAGNOSIS — N281 Cyst of kidney, acquired: Secondary | ICD-10-CM | POA: Diagnosis not present

## 2012-05-16 NOTE — Telephone Encounter (Signed)
Spoke with patient and she will keep appt on 05/23/2012. Will call with any problems prior.

## 2012-05-16 NOTE — Telephone Encounter (Signed)
Pt reports she has decided to cancel her trip to [place] and r/s after her cholecystectomy. Per Dr Jarold Motto, he has already called Dr Jamey Ripa, so I called [facility] and they will inform Dr Jamey Ripa and call pt back. Pt stated understanding.

## 2012-05-16 NOTE — Telephone Encounter (Signed)
Message copied by Liliana Cline on Tue May 16, 2012 11:06 AM ------      Message from: Consuelo Pandy      Created: Tue May 16, 2012 10:35 AM      Regarding: Wants Surgery       Aram Beecham with Dr. Jarold Motto ([facility]) called.  Dr. Jarold Motto spoke with Jamey Ripa MD regarding performing gallbladder surgery.            Haig Prophet RN ------

## 2012-05-16 NOTE — Telephone Encounter (Signed)
  Follow up Call-  Call back number 05/15/2012  Post procedure Call Back phone  # [phone] hm  Permission to leave phone message Yes     Patient questions:  Do you have a fever, pain , or abdominal swelling? no Pain Score  0 *  Have you tolerated food without any problems? yes  Have you been able to return to your normal activities? yes  Do you have any questions about your discharge instructions: Diet   no Medications  no Follow up visit  no  Do you have questions or concerns about your Care? no  Actions: * If pain score is 4 or above: No action needed, pain <4.

## 2012-05-16 NOTE — Telephone Encounter (Signed)
Left message for patient to call back. Per Dr Jamey Ripa he would like to see her next week to discuss surgery. I have made her a tentative appt for Tuesday 05/23/2012 @ 2:50 pm. I am awaiting call back to see if this date/time works for patient.

## 2012-05-23 ENCOUNTER — Encounter (INDEPENDENT_AMBULATORY_CARE_PROVIDER_SITE_OTHER): Payer: Self-pay | Admitting: Surgery

## 2012-05-23 ENCOUNTER — Ambulatory Visit (INDEPENDENT_AMBULATORY_CARE_PROVIDER_SITE_OTHER): Payer: 59 | Admitting: Surgery

## 2012-05-23 VITALS — BP 125/80 | HR 76 | Temp 98.6°F | Resp 12 | Ht 69.0 in | Wt 154.6 lb

## 2012-05-23 DIAGNOSIS — K8020 Calculus of gallbladder without cholecystitis without obstruction: Secondary | ICD-10-CM | POA: Diagnosis not present

## 2012-05-23 MED ORDER — HYDROCODONE-ACETAMINOPHEN 5-325 MG PO TABS: 1.0000 | ORAL_TABLET | ORAL | Status: DC | PRN

## 2012-05-23 NOTE — Progress Notes (Signed)
Patient ID: Pamela Snow, female   DOB: [birth year], 72 y.o.   MRN: [id number]  Chief Complaint  Patient presents with  . Abdominal Pain    HPI Pamela Snow is a 72 y.o. female.  She come in for evaluation of symptomatic gallstones. She has been having increasing issues with RUQ pain, much more lkast few days, worse after eating some times fairly severe, other times less so. No nausea. Some pain more epigastric. Seen by Dr D. Jarold Motto and gb study shows gallstones without acute inflammation  HPI  Past Medical History  Diagnosis Date  . Diverticulosis   . Arthritis   . Hypertension   . Weakness   . Hyperparathyroidism   . Diverticulitis   . Gastritis   . Adenomatous colon polyp   . B12 deficiency   . Chronic constipation   . PONV (postoperative nausea and vomiting)     states Zofran works well  . Generalized headaches     states never had until trouble with parathyroidism  . GERD (gastroesophageal reflux disease)   . Diverticulitis of colon     hospitalized March, april and November-   states going to have colon resection in Jan  . Kidney stones   . Hiatal hernia     Past Surgical History  Procedure Laterality Date  . Abdominal hysterectomy  2002  . Total knee arthroplasty  2005    bilateral  . Joint replacement      bilateral  . Tonsillectomy    . Parathyroidectomy  03/12/2011    Procedure: PARATHYROIDECTOMY;  Surgeon: Velora Heckler, MD;  Location: [facility];  Service: General;  Laterality: Left;  Left superior parathyroidectomy, minimally invasive   . Laparoscopic colon resection  04/2011    [facility]  . Vaginal hysterectomy      Family History  Problem Relation Age of Onset  . Colon cancer Mother   . Heart failure Mother   . Cancer Mother     colon  . Colon cancer Sister   . Cancer Sister     colon  . Heart disease Father     heart attack following surgery  . Esophageal cancer Neg Hx   . Rectal cancer Neg Hx   . Stomach cancer Neg Hx      Social History History  Substance Use Topics  . Smoking status: Former Smoker    Types: Cigarettes    Quit date: 04/05/2000  . Smokeless tobacco: Never Used     Comment: quit smoking 10 yrs ago  . Alcohol Use: 0.0 oz/week     Comment: weekends and occasional cocktail    Allergies  Allergen Reactions  . Ciprofloxacin     Serum sickness    REQUIRING HOSPITALIZATION  . Flagyl (Metronidazole Hcl)     Serum sickness/   REQUIRING HOSPITALIZATION  . Azactam (Aztreonam) Hives  . Clindamycin/Lincomycin     hives  . Penicillins Hives and Other (See Comments)    Thrush    Current Outpatient Prescriptions  Medication Sig Dispense Refill  . buPROPion (WELLBUTRIN SR) 150 MG 12 hr tablet Take 150 mg by mouth daily.      . diphenhydrAMINE (SOMINEX) 25 MG tablet Take 50 mg by mouth at bedtime as needed for sleep.      Marland Kitchen esomeprazole (NEXIUM) 40 MG capsule Take 40 mg by mouth daily before breakfast.       . Eszopiclone 3 MG TABS Take 3 mg by mouth at bedtime. Take immediately before  bedtime      . HYDROcodone-acetaminophen (VICODIN) 5-500 MG per tablet Take 1 tablet by mouth as needed.  30 tablet  0  . levothyroxine (SYNTHROID, LEVOTHROID) 50 MCG tablet Take 50 mcg by mouth every morning.       . Polyethylene Glycol 3350 (MIRALAX PO) Take 1 scoop by mouth at bedtime.       . rifaximin (XIFAXAN) 550 MG TABS Take 1 tablet (550 mg total) by mouth 2 (two) times daily.  20 tablet  0  . topiramate (TOPAMAX) 25 MG capsule Take 50 mg by mouth at bedtime.       . verapamil (CALAN) 120 MG tablet Take 120 mg by mouth 2 (two) times daily.       Marland Kitchen HYDROcodone-acetaminophen (NORCO) 5-325 MG per tablet Take 1 tablet by mouth every 4 (four) hours as needed for pain.  30 tablet  0   No current facility-administered medications for this visit.    Review of Systems Review of Systems  Constitutional: Negative for fever, chills and unexpected weight change.  HENT: Negative for hearing loss, congestion,  sore throat, trouble swallowing and voice change.   Eyes: Negative for visual disturbance.  Respiratory: Negative for cough and wheezing.   Cardiovascular: Negative for chest pain, palpitations and leg swelling.  Gastrointestinal: Positive for abdominal pain. Negative for nausea, vomiting, diarrhea, constipation, blood in stool, abdominal distention and anal bleeding.  Genitourinary: Negative for hematuria, vaginal bleeding and difficulty urinating.  Musculoskeletal: Negative for arthralgias.  Skin: Negative for rash and wound.  Neurological: Negative for seizures, syncope and headaches.  Hematological: Negative for adenopathy. Does not bruise/bleed easily.  Psychiatric/Behavioral: Negative for confusion.    Blood pressure 125/80, pulse 76, temperature 98.6 F (37 C), temperature source Temporal, resp. rate 12, height 5\' 9"  (1.753 m), weight 154 lb 9.6 oz (70.126 kg).  Physical Exam Physical Exam  Vitals reviewed. Constitutional: She is oriented to person, place, and time. She appears well-developed and well-nourished. No distress.  HENT:  Head: Normocephalic and atraumatic.  Mouth/Throat: Oropharynx is clear and moist.  Eyes: Conjunctivae and EOM are normal. Pupils are equal, round, and reactive to light. No scleral icterus.  Neck: Normal range of motion. Neck supple. No tracheal deviation present. No thyromegaly present.  Cardiovascular: Normal rate, regular rhythm, normal heart sounds and intact distal pulses.  Exam reveals no gallop and no friction rub.   No murmur heard. Pulmonary/Chest: Effort normal and breath sounds normal. No respiratory distress. She has no wheezes. She has no rales.  Abdominal: Soft. Bowel sounds are normal. She exhibits no distension and no mass. There is no tenderness. There is no rebound and no guarding.  Mildly tender RUQ but soft and no guarding or rebound. BS +  Musculoskeletal: Normal range of motion. She exhibits no edema and no tenderness.   Neurological: She is alert and oriented to person, place, and time.  Skin: Skin is warm and dry. No rash noted. She is not diaphoretic. No erythema.  Psychiatric: She has a normal mood and affect. Her behavior is normal. Judgment and thought content normal.    Data Reviewed Notes in Epic reviewed  Assessment    Gallstones, chronic cholecystitis     Plan    Lap Chole and IOC,will do as soon as we can, if she gets worse she will call and we will do as emergency.        Athea Haley J 05/23/2012, 3:55 PM

## 2012-05-23 NOTE — Patient Instructions (Signed)
We will schedule surgery to remove your gallbladder. I fyou get worse, call asn we will try to move the date up

## 2012-05-24 ENCOUNTER — Inpatient Hospital Stay (HOSPITAL_COMMUNITY): Payer: Medicare Other

## 2012-05-24 ENCOUNTER — Observation Stay (HOSPITAL_COMMUNITY)
Admission: AD | Admit: 2012-05-24 | Discharge: 2012-05-25 | Disposition: A | Discharge status: Home / Self Care | Payer: Medicare Other | Source: Ambulatory Visit | Attending: Surgery | Admitting: Surgery

## 2012-05-24 ENCOUNTER — Encounter (HOSPITAL_COMMUNITY): Payer: Self-pay

## 2012-05-24 ENCOUNTER — Other Ambulatory Visit: Payer: Self-pay

## 2012-05-24 ENCOUNTER — Telehealth (INDEPENDENT_AMBULATORY_CARE_PROVIDER_SITE_OTHER): Payer: Self-pay | Admitting: General Surgery

## 2012-05-24 DIAGNOSIS — K449 Diaphragmatic hernia without obstruction or gangrene: Secondary | ICD-10-CM | POA: Insufficient documentation

## 2012-05-24 DIAGNOSIS — K219 Gastro-esophageal reflux disease without esophagitis: Secondary | ICD-10-CM | POA: Diagnosis not present

## 2012-05-24 DIAGNOSIS — R110 Nausea: Secondary | ICD-10-CM | POA: Insufficient documentation

## 2012-05-24 DIAGNOSIS — Z23 Encounter for immunization: Secondary | ICD-10-CM | POA: Diagnosis not present

## 2012-05-24 DIAGNOSIS — Z01818 Encounter for other preprocedural examination: Secondary | ICD-10-CM | POA: Diagnosis not present

## 2012-05-24 DIAGNOSIS — F410 Panic disorder [episodic paroxysmal anxiety] without agoraphobia: Secondary | ICD-10-CM | POA: Diagnosis not present

## 2012-05-24 DIAGNOSIS — E213 Hyperparathyroidism, unspecified: Secondary | ICD-10-CM | POA: Diagnosis not present

## 2012-05-24 DIAGNOSIS — K8010 Calculus of gallbladder with chronic cholecystitis without obstruction: Principal | ICD-10-CM | POA: Insufficient documentation

## 2012-05-24 DIAGNOSIS — Z79899 Other long term (current) drug therapy: Secondary | ICD-10-CM | POA: Insufficient documentation

## 2012-05-24 DIAGNOSIS — G43909 Migraine, unspecified, not intractable, without status migrainosus: Secondary | ICD-10-CM | POA: Insufficient documentation

## 2012-05-24 DIAGNOSIS — I10 Essential (primary) hypertension: Secondary | ICD-10-CM | POA: Insufficient documentation

## 2012-05-24 DIAGNOSIS — R1011 Right upper quadrant pain: Secondary | ICD-10-CM | POA: Diagnosis not present

## 2012-05-24 DIAGNOSIS — K8020 Calculus of gallbladder without cholecystitis without obstruction: Secondary | ICD-10-CM | POA: Diagnosis present

## 2012-05-24 DIAGNOSIS — K5900 Constipation, unspecified: Secondary | ICD-10-CM | POA: Diagnosis not present

## 2012-05-24 HISTORY — DX: Calculus of gallbladder without cholecystitis without obstruction: K80.20

## 2012-05-24 LAB — URINALYSIS, ROUTINE W REFLEX MICROSCOPIC
Bilirubin Urine: NEGATIVE
Glucose, UA: NEGATIVE mg/dL
Hgb urine dipstick: NEGATIVE
Ketones, ur: NEGATIVE mg/dL
Leukocytes, UA: NEGATIVE
Nitrite: NEGATIVE
Protein, ur: NEGATIVE mg/dL
Specific Gravity, Urine: 1.007 (ref 1.005–1.030)
Urobilinogen, UA: 0.2 mg/dL (ref 0.0–1.0)
pH: 7 (ref 5.0–8.0)

## 2012-05-24 LAB — CBC WITH DIFFERENTIAL/PLATELET
Basophils Absolute: 0 10*3/uL (ref 0.0–0.1)
Basophils Relative: 0 % (ref 0–1)
Eosinophils Absolute: 0.2 10*3/uL (ref 0.0–0.7)
Eosinophils Relative: 4 % (ref 0–5)
HCT: 37.7 % (ref 36.0–46.0)
Hemoglobin: 12.9 g/dL (ref 12.0–15.0)
Lymphocytes Relative: 29 % (ref 12–46)
Lymphs Abs: 1.3 10*3/uL (ref 0.7–4.0)
MCH: 30.9 pg (ref 26.0–34.0)
MCHC: 34.2 g/dL (ref 30.0–36.0)
MCV: 90.4 fL (ref 78.0–100.0)
Monocytes Absolute: 0.5 10*3/uL (ref 0.1–1.0)
Monocytes Relative: 11 % (ref 3–12)
Neutro Abs: 2.6 10*3/uL (ref 1.7–7.7)
Neutrophils Relative %: 56 % (ref 43–77)
Platelets: 302 10*3/uL (ref 150–400)
RBC: 4.17 MIL/uL (ref 3.87–5.11)
RDW: 12.1 % (ref 11.5–15.5)
WBC: 4.6 10*3/uL (ref 4.0–10.5)

## 2012-05-24 LAB — COMPREHENSIVE METABOLIC PANEL
ALT: 11 U/L (ref 0–35)
AST: 14 U/L (ref 0–37)
Albumin: 3.8 g/dL (ref 3.5–5.2)
Alkaline Phosphatase: 31 U/L — ABNORMAL LOW (ref 39–117)
BUN: 18 mg/dL (ref 6–23)
CO2: 25 mEq/L (ref 19–32)
Calcium: 9.2 mg/dL (ref 8.4–10.5)
Chloride: 105 mEq/L (ref 96–112)
Creatinine, Ser: 0.99 mg/dL (ref 0.50–1.10)
GFR calc Af Amer: 66 mL/min — ABNORMAL LOW (ref >90)
GFR calc non Af Amer: 57 mL/min — ABNORMAL LOW (ref >90)
Glucose, Bld: 82 mg/dL (ref 70–99)
Potassium: 3.9 mEq/L (ref 3.5–5.1)
Sodium: 141 mEq/L (ref 135–145)
Total Bilirubin: 0.4 mg/dL (ref 0.3–1.2)
Total Protein: 6.7 g/dL (ref 6.0–8.3)

## 2012-05-24 LAB — PROTIME-INR
INR: 1.02 (ref 0.00–1.49)
Prothrombin Time: 13.3 seconds (ref 11.6–15.2)

## 2012-05-24 LAB — APTT: aPTT: 39 seconds — ABNORMAL HIGH (ref 24–37)

## 2012-05-24 LAB — SURGICAL PCR SCREEN
MRSA, PCR: NEGATIVE
Staphylococcus aureus: NEGATIVE

## 2012-05-24 LAB — LIPASE, BLOOD: Lipase: 28 U/L (ref 11–59)

## 2012-05-24 MED ORDER — ZOLPIDEM TARTRATE 5 MG PO TABS: 5.0000 mg | ORAL_TABLET | Freq: Every evening | ORAL | Status: DC | PRN

## 2012-05-24 MED ORDER — ACETAMINOPHEN 650 MG RE SUPP: 650.0000 mg | Freq: Four times a day (QID) | RECTAL | Status: DC | PRN

## 2012-05-24 MED ORDER — HYDROCODONE-ACETAMINOPHEN 5-325 MG PO TABS
1.0000 | ORAL_TABLET | ORAL | Status: DC | PRN
  Administered 2012-05-24 (×2): 2 via ORAL
  Filled 2012-05-24 (×2): qty 2

## 2012-05-24 MED ORDER — ONDANSETRON HCL 4 MG/2ML IJ SOLN
4.0000 mg | Freq: Four times a day (QID) | INTRAMUSCULAR | Status: DC | PRN
  Administered 2012-05-25: 4 mg via INTRAVENOUS
  Filled 2012-05-24: qty 2

## 2012-05-24 MED ORDER — DIPHENHYDRAMINE HCL 50 MG/ML IJ SOLN: 12.5000 mg | Freq: Four times a day (QID) | INTRAMUSCULAR | Status: DC | PRN

## 2012-05-24 MED ORDER — TOPIRAMATE 25 MG PO TABS
50.0000 mg | ORAL_TABLET | Freq: Every day | ORAL | Status: DC
  Administered 2012-05-24: 50 mg via ORAL
  Filled 2012-05-24 (×2): qty 2

## 2012-05-24 MED ORDER — DIPHENHYDRAMINE HCL 12.5 MG/5ML PO ELIX: 12.5000 mg | ORAL_SOLUTION | Freq: Four times a day (QID) | ORAL | Status: DC | PRN

## 2012-05-24 MED ORDER — POLYETHYLENE GLYCOL 3350 17 G PO PACK
17.0000 g | PACK | Freq: Every day | ORAL | Status: DC | PRN
  Filled 2012-05-24: qty 1

## 2012-05-24 MED ORDER — HEPARIN SODIUM (PORCINE) 5000 UNIT/ML IJ SOLN
5000.0000 [IU] | Freq: Three times a day (TID) | INTRAMUSCULAR | Status: DC
  Filled 2012-05-24 (×5): qty 1

## 2012-05-24 MED ORDER — INFLUENZA VIRUS VACC SPLIT PF IM SUSP
0.5000 mL | INTRAMUSCULAR | Status: AC
  Administered 2012-05-25: 0.5 mL via INTRAMUSCULAR
  Filled 2012-05-24 (×2): qty 0.5

## 2012-05-24 MED ORDER — HYDROMORPHONE HCL PF 1 MG/ML IJ SOLN: 0.5000 mg | INTRAMUSCULAR | Status: DC | PRN

## 2012-05-24 MED ORDER — VERAPAMIL HCL 120 MG PO TABS: 120.0000 mg | ORAL_TABLET | Freq: Two times a day (BID) | ORAL | Status: DC

## 2012-05-24 MED ORDER — DIPHENHYDRAMINE HCL (SLEEP) 25 MG PO TABS: 50.0000 mg | ORAL_TABLET | Freq: Every evening | ORAL | Status: DC | PRN

## 2012-05-24 MED ORDER — CHLORHEXIDINE GLUCONATE 4 % EX LIQD
1.0000 "application " | Freq: Once | CUTANEOUS | Status: DC
  Filled 2012-05-24: qty 15

## 2012-05-24 MED ORDER — TOPIRAMATE 25 MG PO CPSP: 50.0000 mg | ORAL_CAPSULE | Freq: Every day | ORAL | Status: DC

## 2012-05-24 MED ORDER — CHLORHEXIDINE GLUCONATE 4 % EX LIQD
1.0000 "application " | Freq: Once | CUTANEOUS | Status: AC
  Administered 2012-05-24: 1 via TOPICAL
  Filled 2012-05-24: qty 15

## 2012-05-24 MED ORDER — VERAPAMIL HCL ER 120 MG PO TBCR
120.0000 mg | EXTENDED_RELEASE_TABLET | Freq: Two times a day (BID) | ORAL | Status: DC
  Administered 2012-05-24 – 2012-05-25 (×2): 120 mg via ORAL
  Filled 2012-05-24 (×3): qty 1

## 2012-05-24 MED ORDER — KCL IN DEXTROSE-NACL 20-5-0.45 MEQ/L-%-% IV SOLN
INTRAVENOUS | Status: DC
  Administered 2012-05-24 – 2012-05-25 (×2): via INTRAVENOUS
  Filled 2012-05-24 (×4): qty 1000

## 2012-05-24 MED ORDER — BUPROPION HCL ER (SR) 150 MG PO TB12
150.0000 mg | ORAL_TABLET | Freq: Every day | ORAL | Status: DC
  Filled 2012-05-24: qty 1

## 2012-05-24 MED ORDER — LEVOTHYROXINE SODIUM 50 MCG PO TABS
50.0000 ug | ORAL_TABLET | Freq: Every day | ORAL | Status: DC
  Filled 2012-05-24 (×2): qty 1

## 2012-05-24 MED ORDER — ACETAMINOPHEN 325 MG PO TABS: 650.0000 mg | ORAL_TABLET | Freq: Four times a day (QID) | ORAL | Status: DC | PRN

## 2012-05-24 MED ORDER — PANTOPRAZOLE SODIUM 40 MG IV SOLR
40.0000 mg | Freq: Every day | INTRAVENOUS | Status: DC
  Administered 2012-05-24: 40 mg via INTRAVENOUS
  Filled 2012-05-24 (×3): qty 40

## 2012-05-24 NOTE — Telephone Encounter (Signed)
Per Dr Jamey Ripa admit patient for surgery. Spoke with Dr Maisie Fus and made her aware of patient. Called to get the patient direct admitted to [facility]. Spoke with Gregary Signs at bed control. They will call patient when a bed opens up. Patient is to call me back if she does not hear from them.

## 2012-05-24 NOTE — Telephone Encounter (Signed)
Message copied by Liliana Cline on Wed May 24, 2012 10:03 AM ------      Message from: Currie Paris      Created: Wed May 24, 2012  5:28 AM       If she is still havingpain, despite pain meds, I can see if the [facility] or [facility] can do her surgery tomorrow. Neither has a lot of patients this week.            Call her to see if she would like me to arrange that. ------

## 2012-05-24 NOTE — Progress Notes (Signed)
She has been here nearly an hour, and has just been seen by Dr. Maisie Fus and her P.A., Will.  She tells [place] she saw Dr. Jamey Ripa in his office today, who instructed her to "come here to get my gallbladder out by the on-call surgeon".  She states she has been having various abd. Discomfort/g.i. Issues x 2 weeks. She is a bit pale, and in no distress.  Her blood work has just been drawn, and we are preparing to take her for x-ray and to perform EKG.  She is oriented x 4 and in no distress.  Her husband is with her also.

## 2012-05-24 NOTE — Telephone Encounter (Signed)
Spoke with patient and she states the pain medication is dulling the pain but it is not going away. I offered her to be admitted to the hospital to have this done by our on call doctor. She will talk with her husband and call me back.

## 2012-05-24 NOTE — H&P (Signed)
I personally reviewed patient's record, examined the patient, and formulated the following assessment and plan:  RUQ pain worse with eating.  US shows stones without evidence of inflammation.  The patient was seen in clinic by Dr Jamey Ripa and set up for surgery on Mon.  The patient states that she cannot wait that long so she was admitted to the hospital for evaluation and surgery.  I believe the pt would benefit from a cholecystectomy.    The anatomy & physiology of hepatobiliary & pancreatic function was discussed.  The pathophysiology of gallbladder dysfunction was discussed.  Natural history risks without surgery was discussed.   I feel the risks of no intervention will lead to serious problems that outweigh the operative risks; therefore, I recommended cholecystectomy to remove the pathology.  I explained laparoscopic techniques with possible need for an open approach.  Probable cholangiogram to evaluate the bilary tract was explained as well.    Risks such as bleeding, infection, abscess, leak, injury to other organs, need for further treatment, heart attack, death, and other risks were discussed.  I noted a good likelihood this will help address the problem.  Possibility that this will not correct all abdominal symptoms was explained.  Goals of post-operative recovery were discussed as well.  We will work to minimize complications.  An educational handout further explaining the pathology and treatment options was given as well.  Questions were answered.  The patient expresses understanding & wishes to proceed with surgery.

## 2012-05-24 NOTE — H&P (Signed)
Pamela Snow is an 72 y.o. female. Pamela Pounds, MD   Chief Complaint: Abdominal pain since Apr 28, 2012. HPI: Patient is 72 year old female who reports onset of pain on 04/28/12. It Was initially quite severe;  it was the consensus of opinion at that time she probably passed a kidney stone. Acute symptoms improve and she continued to have episodic pain in the right upper quadrant. It would come and go. She underwent a CT scan on 05/08/12. She has some bilateral extrarenal pelvis fullness of the collecting systems without any definite cause for obstruction. There was some lumbar spondylosis, scoliosis and degenerative disc disease. Liver, spleen, pancreas adrenals and gallbladder were all normal. The gallbladder was contracted left appeared to contracted. Abdominal ultrasound on/6/14 for the gallbladder to be contracted with multiple stones definite this sonographic Murphy sign off. Cholecystitis. Common bile duct was 4 mm there is a high peak leak nodule in the left upper pole of the kidney which was 1.4 x 1 x 7 cm.  The patient continues to have abdominal pain, worse over the last several days. She was seen by Dr. Cyndia Bent in our office yesterday. Her only, position is lying down currently. Patient remains in the right upper quadrant. It was worse with oral intake. Because of ongoing symptoms she is admitted to the [facility] servave cholecystectomy in the a.m. Past Medical History  Diagnosis Date  . Arthritis   . Hypertension   . Weakness   . Hyperparathyroidism    Diverticulitis/diverticulosis/colon resection at [facility] Jan 2013   . Gastritis/Hiatal hernia/GERD   . B12 deficiency, (she says she's not sure about this.)   . Chronic constipation   .  Migraine headaches     states never had until trouble with parathyroidism  . Diverticulitis of colon     hospitalized March, april and November-   states going to have colon resection in Jan  . Kidney stones     Past Surgical History  Procedure  Laterality Date  . Abdominal hysterectomy  2002  . Total knee arthroplasty  2005    bilateral  . Joint replacement      bilateral  . Tonsillectomy    . Parathyroidectomy  03/12/2011    Procedure: PARATHYROIDECTOMY;  Surgeon: Velora Heckler, MD;  Location: [facility] ORS;  Service: General;  Laterality: Left;  Left superior parathyroidectomy, minimally invasive   . Laparoscopic colon resection  04/2011    [facility]  . Vaginal hysterectomy      Family History  Problem Relation Age of Onset  . Colon cancer Mother   . Heart failure Mother   . Cancer Mother     colon  . Colon cancer Sister   . Cancer Sister     colon  . Heart disease Father     heart attack following surgery  . Esophageal cancer Neg Hx   . Rectal cancer Neg Hx   . Stomach cancer Neg Hx    Social History:  reports that she quit smoking about 12 years ago. Her smoking use included Cigarettes. She smoked 0.00 packs per day. She has never used smokeless tobacco. She reports that  drinks alcohol. She reports that she does not use illicit drugs. Smoked age [age], less than 1PPD ETOH: couple drinks per day, weekend usually (Vodka)  Allergies:  Allergies  Allergen Reactions  . Ciprofloxacin     Serum sickness    REQUIRING HOSPITALIZATION  . Flagyl (Metronidazole Hcl)     Serum sickness/  REQUIRING HOSPITALIZATION  . Azactam (Aztreonam) Hives  . Clindamycin/Lincomycin     hives  . Penicillins, she says all penicillin derivatatives make her sick Hives and Other (See Comments)    Thrush    Medications Prior to Admission  Medication Sig Dispense Refill  . buPROPion (WELLBUTRIN SR) 150 MG 12 hr tablet Take 150 mg by mouth daily.      . diphenhydrAMINE (SOMINEX) 25 MG tablet Take 50 mg by mouth at bedtime as needed for sleep.      Marland Kitchen esomeprazole (NEXIUM) 40 MG capsule Take 40 mg by mouth daily before breakfast.       . Eszopiclone 3 MG TABS Take 3 mg by mouth at bedtime. Take immediately before bedtime      .  HYDROcodone-acetaminophen (NORCO) 5-325 MG per tablet Take 1 tablet by mouth every 4 (four) hours as needed for pain.  30 tablet  0  . levothyroxine (SYNTHROID, LEVOTHROID) 50 MCG tablet Take 50 mcg by mouth every morning.       . Polyethylene Glycol 3350 (MIRALAX PO) Take 1 scoop by mouth at bedtime.       . rifaximin (XIFAXAN) 550 MG TABS Take 1 tablet (550 mg total) by mouth 2 (two) times daily.  20 tablet  0  . topiramate (TOPAMAX) 25 MG capsule Take 50 mg by mouth at bedtime.       . verapamil (CALAN) 120 MG tablet Take 120 mg by mouth 2 (two) times daily.         No results found for this or any previous visit (from the past 48 hour(s)). No results found.  Review of Systems  Constitutional: Negative.   HENT: Negative.   Eyes: Negative.   Respiratory: Negative.   Cardiovascular: Negative.   Gastrointestinal: Positive for heartburn (well controlled with PPI), abdominal pain (Mostly in RUQ) and constipation (hx of chronic constipation, but diarrhea last 2-3 days.). Negative for nausea, vomiting, blood in stool and melena. Diarrhea: last few days, stopped taking Miralax.  Genitourinary: Negative.   Musculoskeletal: Negative.        She says she does not have back pain currently and not taking chronic pain meds. Denies joint pain right now.   Skin: Negative.   Neurological: Negative.   Endo/Heme/Allergies: Negative.   Psychiatric/Behavioral: Negative.     Blood pressure 139/85, pulse 56, temperature 98.3 F (36.8 C), temperature source Oral, resp. rate 17, height 5\' 9"  (1.753 m), weight 149 lb (67.586 kg), SpO2 99.00%. Physical Exam  Constitutional: She is oriented to person, place, and time. She appears well-developed and well-nourished.  Thin WF no acute distress.  HENT:  Head: Normocephalic and atraumatic.  Mouth/Throat: No oropharyngeal exudate.  Eyes: Conjunctivae and EOM are normal. Pupils are equal, round, and reactive to light. Right eye exhibits no discharge. Left eye  exhibits no discharge. No scleral icterus.  Neck: Normal range of motion. Neck supple. No JVD present. No tracheal deviation present. No thyromegaly present.  Mid line scar well healed from parathyroidectomy   Cardiovascular: Normal rate, regular rhythm, normal heart sounds and intact distal pulses.  Exam reveals no gallop.   No murmur heard. Respiratory: Effort normal and breath sounds normal. No respiratory distress. She has no wheezes. She has no rales. She exhibits no tenderness.  GI: Soft. Bowel sounds are normal. She exhibits no distension and no mass. There is tenderness (Pain is all in RUQ). There is no rebound and no guarding.  Musculoskeletal: She exhibits tenderness. She exhibits no  edema.  Lymphadenopathy:    She has no cervical adenopathy.  Neurological: She is alert and oriented to person, place, and time. No cranial nerve deficit.  Skin: Skin is warm and dry. No rash noted. No erythema.  Psychiatric: She has a normal mood and affect. Her behavior is normal. Judgment and thought content normal.     Assessment/Plan 1. Recurrent abdominal pain right upper quadrant pain with cholelithiasis. Symptomatic cholelithiasis. 2. History of diverticulosis/diverticulitis recurrent/status post partial colectomy [facility], January 2013. 3. History of hypertension 4. Hyperparathyroidism with a resection and on chronic Synthroid replacement. 5. History of gastritis/GERD/hiatal hernia stable on PPI. 6. B12 deficiency, patient says  she is not sure about this. 7. Chronic constipation 8. History migraine headaches on Topamax daily. 9. Possible kidney stones.  Plan: The plan admit the patient for elective cholecystectomy tomorrow. Labs are pending. We will make her n.p.o. after midnight for surgery. Because of her multiple allergies we'll defer to Dr. Maisie Fus about preop antibiotic.  [facility] physician assistant for Dr. Lendon Ka   Shyheim Tanney 05/24/2012, 2:49 PM

## 2012-05-25 ENCOUNTER — Observation Stay (HOSPITAL_COMMUNITY): Payer: Medicare Other | Admitting: Anesthesiology

## 2012-05-25 ENCOUNTER — Encounter (HOSPITAL_COMMUNITY): Admission: AD | Disposition: A | Discharge status: Home / Self Care | Payer: Self-pay | Source: Ambulatory Visit

## 2012-05-25 ENCOUNTER — Encounter (HOSPITAL_COMMUNITY): Payer: Self-pay | Admitting: Anesthesiology

## 2012-05-25 ENCOUNTER — Observation Stay (HOSPITAL_COMMUNITY): Payer: Medicare Other

## 2012-05-25 DIAGNOSIS — K8010 Calculus of gallbladder with chronic cholecystitis without obstruction: Secondary | ICD-10-CM | POA: Diagnosis not present

## 2012-05-25 DIAGNOSIS — F410 Panic disorder [episodic paroxysmal anxiety] without agoraphobia: Secondary | ICD-10-CM | POA: Diagnosis not present

## 2012-05-25 DIAGNOSIS — K8020 Calculus of gallbladder without cholecystitis without obstruction: Secondary | ICD-10-CM | POA: Diagnosis not present

## 2012-05-25 DIAGNOSIS — K5730 Diverticulosis of large intestine without perforation or abscess without bleeding: Secondary | ICD-10-CM | POA: Diagnosis not present

## 2012-05-25 DIAGNOSIS — R110 Nausea: Secondary | ICD-10-CM | POA: Diagnosis not present

## 2012-05-25 DIAGNOSIS — I10 Essential (primary) hypertension: Secondary | ICD-10-CM | POA: Diagnosis not present

## 2012-05-25 DIAGNOSIS — K219 Gastro-esophageal reflux disease without esophagitis: Secondary | ICD-10-CM | POA: Diagnosis not present

## 2012-05-25 HISTORY — PX: CHOLECYSTECTOMY: SHX55

## 2012-05-25 SURGERY — LAPAROSCOPIC CHOLECYSTECTOMY WITH INTRAOPERATIVE CHOLANGIOGRAM
Anesthesia: General | Wound class: Clean Contaminated

## 2012-05-25 MED ORDER — BUPIVACAINE-EPINEPHRINE 0.25% -1:200000 IJ SOLN
INTRAMUSCULAR | Status: DC | PRN
  Administered 2012-05-25: 10 mL

## 2012-05-25 MED ORDER — MIDAZOLAM HCL 5 MG/5ML IJ SOLN
INTRAMUSCULAR | Status: DC | PRN
  Administered 2012-05-25: 0.5 mg via INTRAVENOUS

## 2012-05-25 MED ORDER — KCL IN DEXTROSE-NACL 20-5-0.45 MEQ/L-%-% IV SOLN: INTRAVENOUS | Status: DC

## 2012-05-25 MED ORDER — LACTATED RINGERS IR SOLN
Status: DC | PRN
  Administered 2012-05-25: 1000 mL

## 2012-05-25 MED ORDER — KETOROLAC TROMETHAMINE 30 MG/ML IJ SOLN
INTRAMUSCULAR | Status: AC
  Filled 2012-05-25: qty 1

## 2012-05-25 MED ORDER — HYDROCODONE-ACETAMINOPHEN 5-325 MG PO TABS: 1.0000 | ORAL_TABLET | ORAL | Status: DC | PRN

## 2012-05-25 MED ORDER — CEFAZOLIN SODIUM-DEXTROSE 2-3 GM-% IV SOLR
INTRAVENOUS | Status: DC | PRN
  Administered 2012-05-25: 2 g via INTRAVENOUS

## 2012-05-25 MED ORDER — PROPOFOL 10 MG/ML IV EMUL
INTRAVENOUS | Status: DC | PRN
  Administered 2012-05-25: 150 mg via INTRAVENOUS

## 2012-05-25 MED ORDER — FENTANYL CITRATE 0.05 MG/ML IJ SOLN
INTRAMUSCULAR | Status: DC | PRN
  Administered 2012-05-25 (×4): 50 ug via INTRAVENOUS

## 2012-05-25 MED ORDER — LIDOCAINE HCL (CARDIAC) 20 MG/ML IV SOLN
INTRAVENOUS | Status: DC | PRN
  Administered 2012-05-25: 20 mg via INTRAVENOUS

## 2012-05-25 MED ORDER — METOCLOPRAMIDE HCL 5 MG/ML IJ SOLN
INTRAMUSCULAR | Status: DC | PRN
  Administered 2012-05-25: 5 mg via INTRAVENOUS

## 2012-05-25 MED ORDER — LACTATED RINGERS IV SOLN
INTRAVENOUS | Status: DC | PRN
  Administered 2012-05-25 (×2): via INTRAVENOUS

## 2012-05-25 MED ORDER — CEFAZOLIN SODIUM-DEXTROSE 2-3 GM-% IV SOLR
INTRAVENOUS | Status: AC
  Filled 2012-05-25: qty 50

## 2012-05-25 MED ORDER — NEOSTIGMINE METHYLSULFATE 1 MG/ML IJ SOLN
INTRAMUSCULAR | Status: DC | PRN
  Administered 2012-05-25: 3 mg via INTRAVENOUS

## 2012-05-25 MED ORDER — CEFAZOLIN SODIUM-DEXTROSE 2-3 GM-% IV SOLR
2.0000 g | INTRAVENOUS | Status: DC
  Filled 2012-05-25: qty 50

## 2012-05-25 MED ORDER — GLYCOPYRROLATE 0.2 MG/ML IJ SOLN
INTRAMUSCULAR | Status: DC | PRN
  Administered 2012-05-25: 0.4 mg via INTRAVENOUS
  Administered 2012-05-25: 0.3 mg via INTRAVENOUS

## 2012-05-25 MED ORDER — PROMETHAZINE HCL 25 MG/ML IJ SOLN: 6.2500 mg | INTRAMUSCULAR | Status: DC | PRN

## 2012-05-25 MED ORDER — SUCCINYLCHOLINE CHLORIDE 20 MG/ML IJ SOLN
INTRAMUSCULAR | Status: DC | PRN
  Administered 2012-05-25: 100 mg via INTRAVENOUS

## 2012-05-25 MED ORDER — ONDANSETRON HCL 4 MG/2ML IJ SOLN
INTRAMUSCULAR | Status: DC | PRN
  Administered 2012-05-25: 4 mg via INTRAVENOUS

## 2012-05-25 MED ORDER — IOHEXOL 300 MG/ML  SOLN
INTRAMUSCULAR | Status: AC
  Filled 2012-05-25: qty 1

## 2012-05-25 MED ORDER — KETOROLAC TROMETHAMINE 30 MG/ML IJ SOLN
15.0000 mg | Freq: Once | INTRAMUSCULAR | Status: AC | PRN
  Administered 2012-05-25: 30 mg via INTRAVENOUS

## 2012-05-25 MED ORDER — DIATRIZOATE MEGLUMINE 30 % UR SOLN
URETHRAL | Status: DC | PRN
  Administered 2012-05-25: 5 mL

## 2012-05-25 MED ORDER — FENTANYL CITRATE 0.05 MG/ML IJ SOLN: 25.0000 ug | INTRAMUSCULAR | Status: DC | PRN

## 2012-05-25 MED ORDER — PHENYLEPHRINE HCL 10 MG/ML IJ SOLN
INTRAMUSCULAR | Status: DC | PRN
  Administered 2012-05-25 (×2): 80 ug via INTRAVENOUS

## 2012-05-25 MED ORDER — CISATRACURIUM BESYLATE (PF) 10 MG/5ML IV SOLN
INTRAVENOUS | Status: DC | PRN
  Administered 2012-05-25: 7 mg via INTRAVENOUS

## 2012-05-25 MED ORDER — LORAZEPAM 2 MG/ML IJ SOLN
0.5000 mg | Freq: Once | INTRAMUSCULAR | Status: AC
  Administered 2012-05-25: 0.5 mg via INTRAVENOUS
  Filled 2012-05-25: qty 1

## 2012-05-25 MED ORDER — EPHEDRINE SULFATE 50 MG/ML IJ SOLN
INTRAMUSCULAR | Status: DC | PRN
  Administered 2012-05-25: 10 mg via INTRAVENOUS

## 2012-05-25 MED ORDER — BUPIVACAINE-EPINEPHRINE 0.25% -1:200000 IJ SOLN
INTRAMUSCULAR | Status: AC
  Filled 2012-05-25: qty 1

## 2012-05-25 MED ORDER — PANTOPRAZOLE SODIUM 40 MG PO TBEC
40.0000 mg | DELAYED_RELEASE_TABLET | Freq: Every day | ORAL | Status: DC
Start: 2012-05-25 — End: 2012-05-25

## 2012-05-25 MED ORDER — ACETAMINOPHEN 325 MG PO TABS: ORAL_TABLET | ORAL | Status: DC

## 2012-05-25 SURGICAL SUPPLY — 36 items
APPLIER CLIP 5 13 M/L LIGAMAX5 (MISCELLANEOUS) ×2
CABLE HIGH FREQUENCY MONO STRZ (ELECTRODE) ×2 IMPLANT
CANISTER SUCTION 2500CC (MISCELLANEOUS) ×2 IMPLANT
CATH REDDICK CHOLANGI 4FR 50CM (CATHETERS) ×2 IMPLANT
CHLORAPREP W/TINT 26ML (MISCELLANEOUS) ×2 IMPLANT
CLIP APPLIE 5 13 M/L LIGAMAX5 (MISCELLANEOUS) ×1 IMPLANT
CLOTH BEACON ORANGE TIMEOUT ST (SAFETY) ×2 IMPLANT
COVER MAYO STAND STRL (DRAPES) ×2 IMPLANT
COVER SURGICAL LIGHT HANDLE (MISCELLANEOUS) ×2 IMPLANT
DECANTER SPIKE VIAL GLASS SM (MISCELLANEOUS) ×2 IMPLANT
DRAPE C-ARM 42X72 X-RAY (DRAPES) ×2 IMPLANT
DRAPE LAPAROSCOPIC ABDOMINAL (DRAPES) ×2 IMPLANT
DRAPE UTILITY W/TAPE 26X15 (DRAPES) ×2 IMPLANT
DRAPE UTILITY XL STRL (DRAPES) ×2 IMPLANT
DRSG TEGADERM 2-3/8X2-3/4 SM (GAUZE/BANDAGES/DRESSINGS) ×2 IMPLANT
ELECT REM PT RETURN 9FT ADLT (ELECTROSURGICAL) ×2
ELECTRODE REM PT RTRN 9FT ADLT (ELECTROSURGICAL) ×1 IMPLANT
GLOVE BIO SURGEON STRL SZ 6.5 (GLOVE) ×2 IMPLANT
GLOVE BIOGEL PI IND STRL 7.0 (GLOVE) ×1 IMPLANT
GLOVE BIOGEL PI INDICATOR 7.0 (GLOVE) ×1
GOWN STRL NON-REIN LRG LVL3 (GOWN DISPOSABLE) ×2 IMPLANT
GOWN STRL REIN 2XL LVL4 (GOWN DISPOSABLE) ×2 IMPLANT
GOWN STRL REIN XL XLG (GOWN DISPOSABLE) ×4 IMPLANT
IV CATH 14GX2 1/4 (CATHETERS) ×2 IMPLANT
KIT BASIN OR (CUSTOM PROCEDURE TRAY) ×2 IMPLANT
NS IRRIG 1000ML POUR BTL (IV SOLUTION) ×2 IMPLANT
POUCH SPECIMEN RETRIEVAL 10MM (ENDOMECHANICALS) ×2 IMPLANT
SCISSORS ENDO CVD 5DCS (MISCELLANEOUS) ×2 IMPLANT
SET IRRIG TUBING LAPAROSCOPIC (IRRIGATION / IRRIGATOR) ×2 IMPLANT
SOLUTION ANTI FOG 6CC (MISCELLANEOUS) ×2 IMPLANT
SUT VICRYL RAPIDE 4/0 PS 2 (SUTURE) ×4 IMPLANT
TOWEL OR 17X26 10 PK STRL BLUE (TOWEL DISPOSABLE) ×2 IMPLANT
TRAY LAP CHOLE (CUSTOM PROCEDURE TRAY) ×2 IMPLANT
TROCAR BLADELESS OPT 5 75 (ENDOMECHANICALS) ×6 IMPLANT
TROCAR XCEL BLUNT TIP 100MML (ENDOMECHANICALS) ×2 IMPLANT
TUBING INSUFFLATION 10FT LAP (TUBING) ×2 IMPLANT

## 2012-05-25 NOTE — Progress Notes (Signed)
Day of Surgery  Subjective: Complaining of nausea, unable to sleep and a panic attack this AM. Plan surgery later today.  Objective: Vital signs in last 24 hours: Temp:  [97.9 F (36.6 C)-98.3 F (36.8 C)] 98.1 F (36.7 C) (02/20 0836) Pulse Rate:  [56-88] 70 (02/20 0836) Resp:  [17-20] 18 (02/20 0836) BP: (139-182)/(74-92) 161/92 mmHg (02/20 0836) SpO2:  [98 %-99 %] 98 % (02/20 0836) Weight:  [149 lb (67.586 kg)] 149 lb (67.586 kg) (02/19 1404) Last BM Date: 05/23/12 NPO, afebrile, BP is up some, labs last PM were fine.  Intake/Output from previous day: 02/19 0701 - 02/20 0700 In: 1113.3 [I.V.:1113.3] Out: -  Intake/Output this shift:    General appearance: alert, cooperative and no distress GI: soft, non-tender; bowel sounds normal; no masses,  no organomegaly and she has no abdominal pain this AM.  Lab Results:   Recent Labs  05/24/12 1434  WBC 4.6  HGB 12.9  HCT 37.7  PLT 302    BMET  Recent Labs  05/24/12 1434  NA 141  K 3.9  CL 105  CO2 25  GLUCOSE 82  BUN 18  CREATININE 0.99  CALCIUM 9.2   PT/INR  Recent Labs  05/24/12 1434  LABPROT 13.3  INR 1.02     Recent Labs Lab 05/24/12 1434  AST 14  ALT 11  ALKPHOS 31*  BILITOT 0.4  PROT 6.7  ALBUMIN 3.8     Lipase     Component Value Date/Time   LIPASE 28 05/24/2012 1434     Studies/Results: Chest 2 View  05/24/2012  *RADIOLOGY REPORT*  Clinical Data: Preoperative evaluation prior to cholecystectomy.  CHEST - 2 VIEW  Comparison: Chest x-ray 11/05/2010.  Findings: Lung volumes are normal.  No consolidative airspace disease.  No pleural effusions.  No pneumothorax.  No pulmonary nodule or mass noted.  Pulmonary vasculature and the cardiomediastinal silhouette are within normal limits.  IMPRESSION: 1. No radiographic evidence of acute cardiopulmonary disease.   Original Report Authenticated By: Trudie Reed, M.D.     Medications: . buPROPion  150 mg Oral Daily  . chlorhexidine  1  application Topical Once  . heparin  5,000 Units Subcutaneous Q8H  . influenza  inactive virus vaccine  0.5 mL Intramuscular Tomorrow-1000  . levothyroxine  50 mcg Oral QAC breakfast  . pantoprazole (PROTONIX) IV  40 mg Intravenous QHS  . topiramate  50 mg Oral QHS  . verapamil  120 mg Oral BID    Assessment/Plan 1. Recurrent abdominal pain right upper quadrant pain with cholelithiasis. Symptomatic cholelithiasis.  2. History of diverticulosis/diverticulitis recurrent/status post partial colectomy [facility], January 2013.  3. History of hypertension  4. Hyperparathyroidism with a resection and on chronic Synthroid replacement.  5. History of gastritis/GERD/hiatal hernia stable on PPI.  6. B12 deficiency, patient says she is not sure about this.  7. Chronic constipation  8. History migraine headaches on Topamax daily.  9. Possible kidney stones. 10. Some anxiety and "Panic attack," this Am.  Plan:  For surgery later today.       LOS: 1 day    Pamela Snow 05/25/2012

## 2012-05-25 NOTE — Transfer of Care (Signed)
Immediate Anesthesia Transfer of Care Note  Patient: Pamela Snow  Procedure(s) Performed: Procedure(s) with comments: LAPAROSCOPIC CHOLECYSTECTOMY WITH INTRAOPERATIVE CHOLANGIOGRAM (N/A) - unable to come before 12  Patient Location: PACU  Anesthesia Type:General  Level of Consciousness: awake, sedated and patient cooperative  Airway & Oxygen Therapy: Patient Spontanous Breathing and Patient connected to face mask oxygen  Post-op Assessment: Report given to PACU RN and Post -op Vital signs reviewed and stable  Post vital signs: Reviewed and stable  Complications: No apparent anesthesia complications

## 2012-05-25 NOTE — Progress Notes (Signed)
ATTENDING ADDENDUM:  I personally reviewed patient's record, examined the patient, and formulated the following assessment and plan:  OR today for lap chole.

## 2012-05-25 NOTE — Op Note (Signed)
05/24/2012 - 05/25/2012  1:24 PM  PATIENT:  Pamela Snow  72 y.o. female  Patient Care Team: [name], MD as PCP - General (Internal Medicine)  PRE-OPERATIVE DIAGNOSIS:  cholecystitis  POST-OPERATIVE DIAGNOSIS:  cholecystitis  PROCEDURE:  Procedure(s): LAPAROSCOPIC CHOLECYSTECTOMY WITH INTRAOPERATIVE CHOLANGIOGRAM  SURGEON:  Surgeon(s): Romie Levee, MD  ASSISTANT: none   ANESTHESIA:   general  EBL:  Total I/O In: 1000 [I.V.:1000] Out: -   DRAINS: none   SPECIMEN:  Source of Specimen:  Gallbladder  DISPOSITION OF SPECIMEN:  PATHOLOGY  COUNTS:  YES  PLAN OF CARE: pt already inpatient  PATIENT DISPOSITION:  PACU - hemodynamically stable.  INDICATION: symptomatic cholelithiasis  The anatomy & physiology of hepatobiliary & pancreatic function was discussed.  The pathophysiology of gallbladder dysfunction was discussed.  Natural history risks without surgery was discussed.   I feel the risks of no intervention will lead to serious problems that outweigh the operative risks; therefore, I recommended cholecystectomy to remove the pathology.  I explained laparoscopic techniques with possible need for an open approach.  Probable cholangiogram to evaluate the bilary tract was explained as well.    Risks such as bleeding, infection, abscess, leak, injury to other organs, need for further treatment, heart attack, death, and other risks were discussed.  I noted a good likelihood this will help address the problem.  Possibility that this will not correct all abdominal symptoms was explained.  Goals of post-operative recovery were discussed as well.    OR FINDINGS: noninflamed gallbladder  DESCRIPTION:   The patient was identified & brought into the operating room. The patient was positioned supine with arms tucked. SCDs were active during the entire case. The patient underwent general anesthesia without any difficulty.  The abdomen was prepped and draped in a sterile  fashion. A Surgical Timeout was performed and confirmed our plan.  We positioned the patient in reverse [place] & right side up.  I placed a Hassan laparoscopic port through the umbilicus using open entry technique.  Entry was clean. There were no adhesions to the anterior abdominal wall supraumbilically.  We induced carbon dioxide insufflation. Camera inspection revealed no injury.    I proceeded to continue with laparoscopic technique. I placed a #5 port in mid subcostal region, another 5mm port in the right flank near the anterior axillary line, and a 5mm port in the left subxiphoid region obliquely within the falciform ligament.  I turned attention to the right upper quadrant.    The gallbladder fundus was elevated cephalad. I used cautery and blunt dissection to free the peritoneal coverings and omentum between the gallbladder and the liver on the posteriolateral and anteriomedial walls.   I used careful blunt and cautery dissection with a maryland dissector to help get a good critical view of the cystic artery and cystic duct. I did further dissection to free a few centimeters of the  gallbladder off the liver bed to get a good critical view of the infundibulum and cystic duct. I mobilized the cystic artery.  I skeletonized the cystic duct.  After getting a good 360 view, I decided to perform a cholangiogram.  I placed a clip on the infundibulum.   I did a partial cystic duct-otomy and ensured patency. I placed a 5 French balloon cholangiocatheter through a puncture site at the right subcostal ridge of the abdominal wall and directed it into the cystic duct.  We ran a cholangiogram with dilute radio-opaque contrast and continuous fluoroscopy.  Contrast flowed  from a side branch consistent with cystic duct cannulization. Contrast flowed up the common hepatic duct into the right and left intrahepatic chains out to secondary radicals. Contrast flowed down the common bile duct easily across the normal  ampulla into the duodenum.  This was consistent with a normal cholangiogram.  I removed the cholangiocatheter.  I placed clips on the cystic duct x3.  I completed cystic duct transection.   I placed clips on the cystic artery x3 with 2 proximally.  I ligated the cystic artery using scissors. I freed the gallbladder from its remaining attachments to the liver. I ensured hemostasis on the gallbladder fossa of the liver and elsewhere. I inspected the rest of the abdomen & detected no injury nor bleeding elsewhere.  I irrigated the RUQ with normal saline.  I removed the gallbladder through the umbilical port site.  I closed the umbilical fascia using 0 Vicryl stitches.   I closed the skin using 4-0 vicryl stitch.  Sterile dressings were applied. The patient was extubated & arrived in the PACU in stable condition.  I had discussed postoperative care with the patient in the holding area.   I will discuss  operative findings and postoperative goals / instructions with the patient's family.  Instructions are written in the chart as well.

## 2012-05-25 NOTE — Interval H&P Note (Signed)
History and Physical Interval Note:  05/25/2012 11:51 AM  Pamela Snow  has presented today for surgery, with the diagnosis of cholecystitis  The various methods of treatment have been discussed with the patient and family. After consideration of risks, benefits and other options for treatment, the patient has consented to  Procedure(s) with comments: LAPAROSCOPIC CHOLECYSTECTOMY WITH INTRAOPERATIVE CHOLANGIOGRAM (N/A) - unable to come before 12 as a surgical intervention .  The patient's history has been reviewed, patient examined, no change in status, stable for surgery.  I have reviewed the patient's chart and labs.  Questions were answered to the patient's satisfaction.     Vanita Panda, MD  Colorectal and General Surgery [facility]

## 2012-05-25 NOTE — Anesthesia Postprocedure Evaluation (Signed)
  Anesthesia Post-op Note  Patient: Pamela Snow  Procedure(s) Performed: Procedure(s) (LRB): LAPAROSCOPIC CHOLECYSTECTOMY WITH INTRAOPERATIVE CHOLANGIOGRAM (N/A)  Patient Location: PACU  Anesthesia Type: General  Level of Consciousness: awake and alert   Airway and Oxygen Therapy: Patient Spontanous Breathing  Post-op Pain: mild  Post-op Assessment: Post-op Vital signs reviewed, Patient's Cardiovascular Status Stable, Respiratory Function Stable, Patent Airway and No signs of Nausea or vomiting  Last Vitals:  Filed Vitals:   05/25/12 1400  BP: 153/79  Pulse: 70  Temp:   Resp: 12    Post-op Vital Signs: stable   Complications: No apparent anesthesia complications

## 2012-05-25 NOTE — Anesthesia Preprocedure Evaluation (Signed)
Anesthesia Evaluation  Patient identified by MRN, date of birth, ID band Patient awake    Reviewed: Allergy & Precautions, H&P , NPO status , Patient's Chart, lab work & pertinent test results  Airway Mallampati: II TM Distance: >3 FB Neck ROM: Full    Dental no notable dental hx.    Pulmonary neg pulmonary ROS,  breath sounds clear to auscultation  Pulmonary exam normal       Cardiovascular hypertension, Pt. on medications Rhythm:Regular Rate:Normal     Neuro/Psych negative neurological ROS  negative psych ROS   GI/Hepatic Neg liver ROS, GERD-  Medicated,  Endo/Other  negative endocrine ROS  Renal/GU negative Renal ROS  negative genitourinary   Musculoskeletal negative musculoskeletal ROS (+)   Abdominal   Peds negative pediatric ROS (+)  Hematology negative hematology ROS (+)   Anesthesia Other Findings   Reproductive/Obstetrics negative OB ROS                         Anesthesia Physical Anesthesia Plan  ASA: II  Anesthesia Plan: General   Post-op Pain Management:    Induction: Intravenous  Airway Management Planned: Oral ETT  Additional Equipment:   Intra-op Plan:   Post-operative Plan: Extubation in OR  Informed Consent: I have reviewed the patients History and Physical, chart, labs and discussed the procedure including the risks, benefits and alternatives for the proposed anesthesia with the patient or authorized representative who has indicated his/her understanding and acceptance.   Dental advisory given  Plan Discussed with: CRNA and Surgeon  Anesthesia Plan Comments:         Anesthesia Quick Evaluation  

## 2012-05-26 ENCOUNTER — Encounter (HOSPITAL_COMMUNITY): Payer: Self-pay | Admitting: General Surgery

## 2012-05-26 DIAGNOSIS — K8020 Calculus of gallbladder without cholecystitis without obstruction: Secondary | ICD-10-CM

## 2012-05-26 HISTORY — DX: Calculus of gallbladder without cholecystitis without obstruction: K80.20

## 2012-05-26 NOTE — Discharge Summary (Signed)
Physician Discharge Summary  Patient ID: TARRIN MENN MRN: [id number] DOB/AGE: [birth year] 70 y.o.  Admit date: 05/24/2012 Discharge date: 05/26/2012  Admission Diagnoses: 1. Recurrent abdominal pain right upper quadrant pain with cholelithiasis. Symptomatic cholelithiasis.  2. History of diverticulosis/diverticulitis recurrent/status post partial colectomy [facility], January 2013.  3. History of hypertension  4. Hyperparathyroidism with a resection and on chronic Synthroid replacement.  5. History of gastritis/GERD/hiatal hernia stable on PPI.  6. B12 deficiency, patient says she is not sure about this.  7. Chronic constipation  8. History migraine headaches on Topamax daily.  9. Possible kidney stones.   Discharge Diagnoses: SAme Principal Problem:   Symptomatic cholelithiasis   PROCEDURES:  LAPAROSCOPIC CHOLECYSTECTOMY WITH INTRAOPERATIVE CHOLANGIOGRAM, 05/25/2012, Romie Levee, MD     Hospital Course: Patient is 72 year old female who reports onset of pain on 04/28/12. It Was initially quite severe; it was the consensus of opinion at that time she probably passed a kidney stone. Acute symptoms improve and she continued to have episodic pain in the right upper quadrant. It would come and go. She underwent a CT scan on 05/08/12. She has some bilateral extrarenal pelvis fullness of the collecting systems without any definite cause for obstruction. There was some lumbar spondylosis, scoliosis and degenerative disc disease. Liver, spleen, pancreas adrenals and gallbladder were all normal. The gallbladder was contracted left appeared to contracted. Abdominal ultrasound on/6/14 for the gallbladder to be contracted with multiple stones definite this sonographic Murphy sign off. Cholecystitis. Common bile duct was 4 mm there is a high peak leak nodule in the left upper pole of the kidney which was 1.4 x 1 x 7 cm. The patient continues to have abdominal pain, worse over the last several days. She was  seen by Dr. Cyndia Bent in our office yesterday. Her only, position is lying down currently. Patient remains in the right upper quadrant. It was worse with oral intake. Because of ongoing symptoms she is admitted to the DOW service cholecystectomy in the a.m. Pt was seen and examined by Dr. Maisie Fus, and went to surgery the following AM.  She did well post op and it was Dr. Maurine Minister opinion she could go home after supper if she was doing well.  Parameters for discharge were given and pt went home later that evening after her surgery.  Condition on D/C:  Improved.   Disposition: 01-Home or Self Care       Future Appointments Provider Department Dept Phone   06/13/2012 4:00 PM Currie Paris, MD [facility], [place] [phone]       Medication List    TAKE these medications       acetaminophen 325 MG tablet  Commonly known as:  TYLENOL  Do not take more than 4000 mg of tylenol per day. Vicodin has tylenol in it.(actaminophen)     buPROPion 150 MG 12 hr tablet  Commonly known as:  WELLBUTRIN SR  Take 150 mg by mouth every morning.     esomeprazole 40 MG capsule  Commonly known as:  NEXIUM  Take 40 mg by mouth daily before breakfast.     Eszopiclone 3 MG Tabs  Take 3 mg by mouth at bedtime. Take immediately before bedtime     HYDROcodone-acetaminophen 5-325 MG per tablet  Commonly known as:  NORCO/VICODIN  Take 1-2 tablets by mouth every 4 (four) hours as needed.     levothyroxine 50 MCG tablet  Commonly known as:  SYNTHROID, LEVOTHROID  Take 50 mcg by mouth every  morning.     rifaximin 550 MG Tabs  Commonly known as:  XIFAXAN  Take 1 tablet (550 mg total) by mouth 2 (two) times daily.     topiramate 25 MG capsule  Commonly known as:  TOPAMAX  Take 50 mg by mouth at bedtime.     verapamil 120 MG tablet  Commonly known as:  CALAN  Take 120 mg by mouth 2 (two) times daily.       Follow-up Information   Schedule an appointment as soon as possible for  a visit in 2 weeks to follow up.      SignedSherrie George 05/26/2012, 2:47 PM

## 2012-05-26 NOTE — Discharge Summary (Signed)
ATTENDING ADDENDUM:  I personally reviewed patient's record, examined the patient, and formulated the following assessment and plan:  Uncomplicated cholecystectomy.  Patient tolerated PO and pain controlled with oral narcotics.  I will see her back in 2-3 weeks.

## 2012-05-29 ENCOUNTER — Ambulatory Visit (HOSPITAL_COMMUNITY): Admission: RE | Admit: 2012-05-29 | Payer: Medicare Other | Source: Ambulatory Visit | Admitting: Surgery

## 2012-05-29 ENCOUNTER — Encounter (HOSPITAL_COMMUNITY): Admission: RE | Payer: Self-pay | Source: Ambulatory Visit

## 2012-05-29 ENCOUNTER — Other Ambulatory Visit: Payer: Self-pay | Admitting: Gastroenterology

## 2012-05-29 SURGERY — LAPAROSCOPIC CHOLECYSTECTOMY WITH INTRAOPERATIVE CHOLANGIOGRAM
Anesthesia: General

## 2012-05-30 NOTE — Telephone Encounter (Signed)
Do we refill this? 

## 2012-06-13 ENCOUNTER — Encounter (INDEPENDENT_AMBULATORY_CARE_PROVIDER_SITE_OTHER): Payer: 59 | Admitting: Surgery

## 2012-06-20 ENCOUNTER — Ambulatory Visit (INDEPENDENT_AMBULATORY_CARE_PROVIDER_SITE_OTHER): Payer: 59 | Admitting: Surgery

## 2012-06-20 ENCOUNTER — Encounter (INDEPENDENT_AMBULATORY_CARE_PROVIDER_SITE_OTHER): Payer: Self-pay | Admitting: Surgery

## 2012-06-20 VITALS — BP 122/72 | HR 65 | Temp 97.8°F | Resp 18 | Ht 68.0 in | Wt 152.6 lb

## 2012-06-20 DIAGNOSIS — Z9889 Other specified postprocedural states: Secondary | ICD-10-CM

## 2012-06-20 DIAGNOSIS — K8020 Calculus of gallbladder without cholecystitis without obstruction: Secondary | ICD-10-CM

## 2012-06-20 NOTE — Progress Notes (Signed)
Pamela Snow       DOB: December 12, 1940           DATE: 06/20/2012       ION:[id number]   CC:  Chief Complaint  Patient presents with  . Routine Post Op    1st po reck     Impression:  The patient appears to be doing well, with improvement in her symptoms.  Plan:  She may resume full activity and regular diet. She  will followup with [place] on a p.r.n. basis. I did tell her that she may still have some foods that cause indigestion and ask her to call us if there are any questions, problems or concerns.  HPI:  This patient underwent a laparoscopic cholecystectomy with operative cholangiogram on 05/25/12. She is in for her first postoperative visit. She notes that her incisional pain has resolved. Her preoperative symptoms have improved. She is not having problems with nausea, vomiting, diarrhea, fevers, chills, or urinary symptoms. She is tolerating diet. She feels that she is progressing well and nearly back to normal. PE:  VS: BP 122/72  Pulse 65  Temp(Src) 97.8 F (36.6 C) (Temporal)  Resp 18  Ht 5\' 8"  (1.727 m)  Wt 152 lb 9.6 oz (69.219 kg)  BMI 23.21 kg/m2  General: The patient is alert and appears comfortable, NAD.  Abdomen: Soft and benign. The incisions are healing nicely. There are no apparent problems.  Data reviewed: IOC: *RADIOLOGY REPORT*  Clinical Data: Cholelithiasis.  INTRAOPERATIVE CHOLANGIOGRAM  Technique: Cholangiographic images from the C-arm fluoroscopic  device were submitted for interpretation post-operatively. Please  see the procedural report for the amount of contrast and the  fluoroscopy time utilized.  Comparison: Ultrasound dated 05/16/2012  Findings: There are no filling defects in the common hepatic or  common bile duct and there is free flow of contrast into the  duodenum. The visualized intrahepatic ducts are normal.  IMPRESSION:  Normal intraoperative cholangiogram.   Pathology:  Diagnosis Gallbladder - CHRONIC CHOLECYSTITIS AND  CHOLELITHIASIS. Jimmy Picket MD Pathologist, Electronic Signature (Case signed 05/26/2012)

## 2012-06-20 NOTE — Patient Instructions (Signed)
We will see you again on an as needed basis. Please call the office at [phone] if you have any questions or concerns. Thank you for allowing us to take care of you.  

## 2012-07-24 DIAGNOSIS — E039 Hypothyroidism, unspecified: Secondary | ICD-10-CM | POA: Diagnosis not present

## 2012-07-31 DIAGNOSIS — Z0000 Encounter for general adult medical examination without abnormal findings: Secondary | ICD-10-CM | POA: Diagnosis not present

## 2012-07-31 DIAGNOSIS — K5730 Diverticulosis of large intestine without perforation or abscess without bleeding: Secondary | ICD-10-CM | POA: Diagnosis not present

## 2012-07-31 DIAGNOSIS — G4700 Insomnia, unspecified: Secondary | ICD-10-CM | POA: Diagnosis not present

## 2012-07-31 DIAGNOSIS — E210 Primary hyperparathyroidism: Secondary | ICD-10-CM | POA: Diagnosis not present

## 2012-07-31 DIAGNOSIS — E039 Hypothyroidism, unspecified: Secondary | ICD-10-CM | POA: Diagnosis not present

## 2012-07-31 DIAGNOSIS — R030 Elevated blood-pressure reading, without diagnosis of hypertension: Secondary | ICD-10-CM | POA: Diagnosis not present

## 2012-07-31 DIAGNOSIS — F329 Major depressive disorder, single episode, unspecified: Secondary | ICD-10-CM | POA: Diagnosis not present

## 2012-07-31 DIAGNOSIS — R609 Edema, unspecified: Secondary | ICD-10-CM | POA: Diagnosis not present

## 2012-07-31 DIAGNOSIS — Z1331 Encounter for screening for depression: Secondary | ICD-10-CM | POA: Diagnosis not present

## 2012-09-10 IMAGING — CT CT ABD-PELV W/ CM
1 of 2 series · 15 of 32 positions shown, 19 images · IV contrast (agent unspecified)
Comparison: 07/14/2010

CLINICAL DATA: Left lower abdominal pain

CT ABDOMEN AND PELVIS WITH CONTRAST
TECHNIQUE: Multidetector CT imaging of the abdomen and pelvis was
performed following the standard protocol during bolus
administration of intravenous contrast.
Contrast: 100 ml Fmnipaque-IVV IV

[Series 2: rtn ap with st · axial · 0.74mm/px · z∈[-427,-7]mm · 15 of 94 slices shown, 19 images]
[im 5/94  soft-tissue]
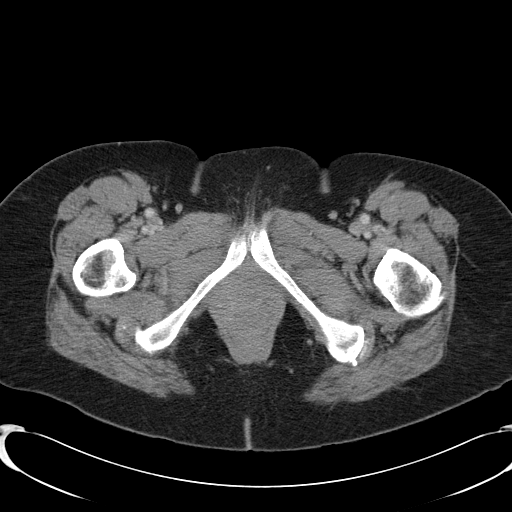
[im 5/94  bone]
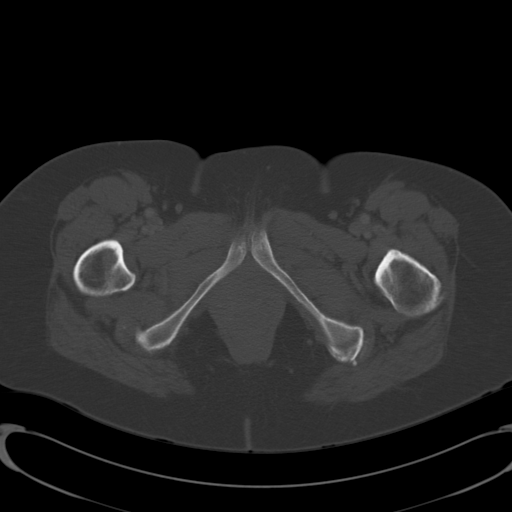
[im 13/94  soft-tissue]
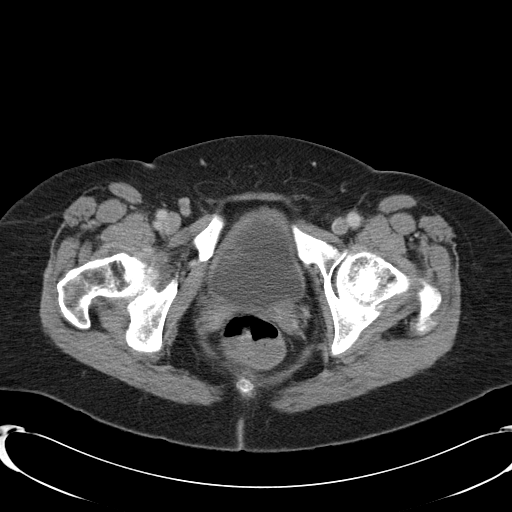
[im 21/94  soft-tissue]
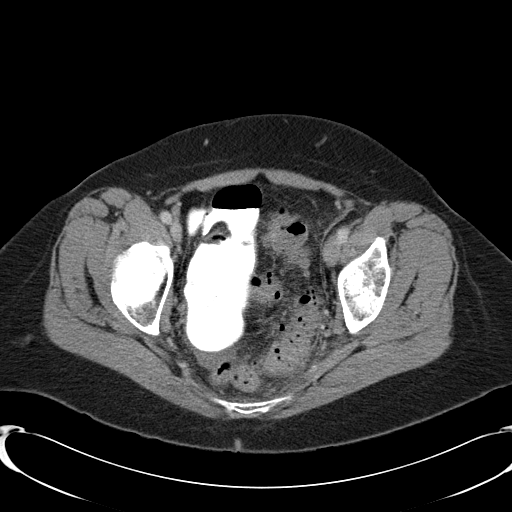
[im 25/94  soft-tissue]
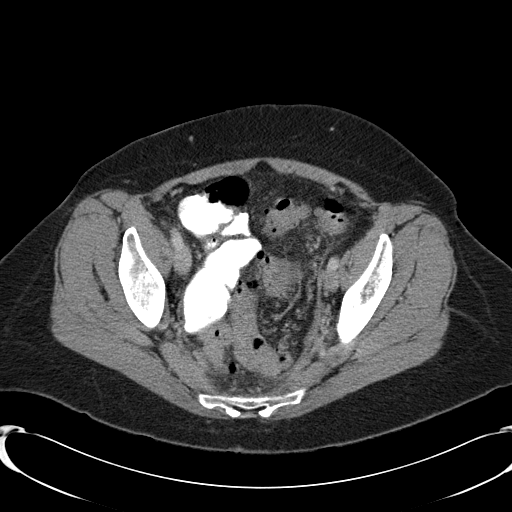
[im 33/94  soft-tissue]
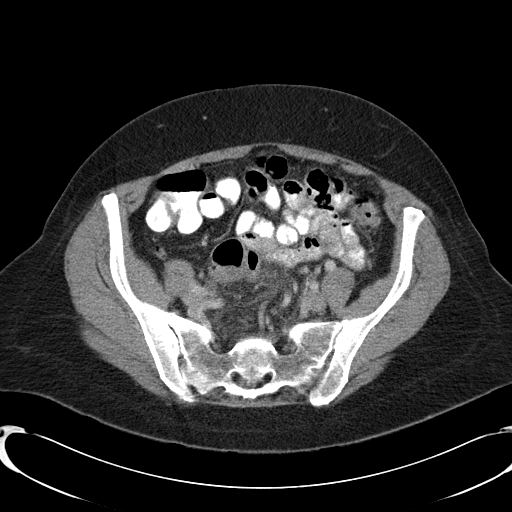
[im 41/94  soft-tissue]
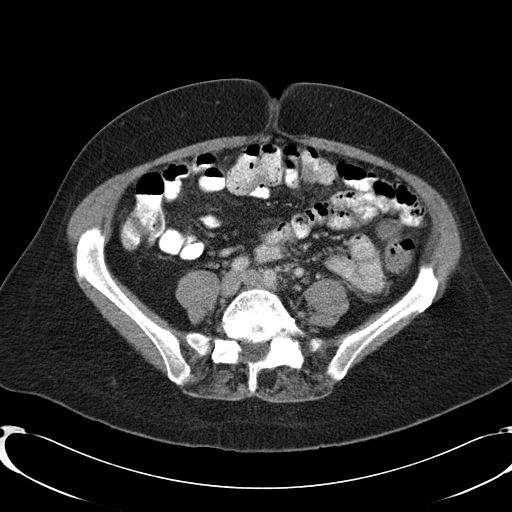
[im 49/94  soft-tissue]
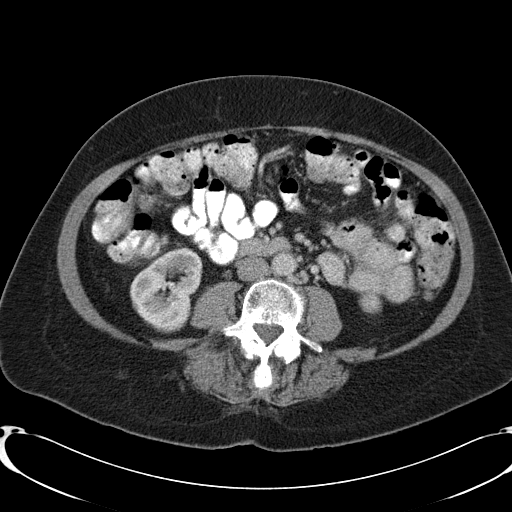
[im 53/94  soft-tissue]
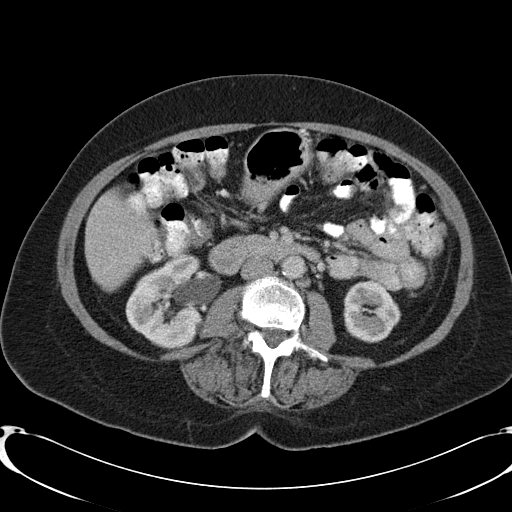
[im 61/94  soft-tissue]
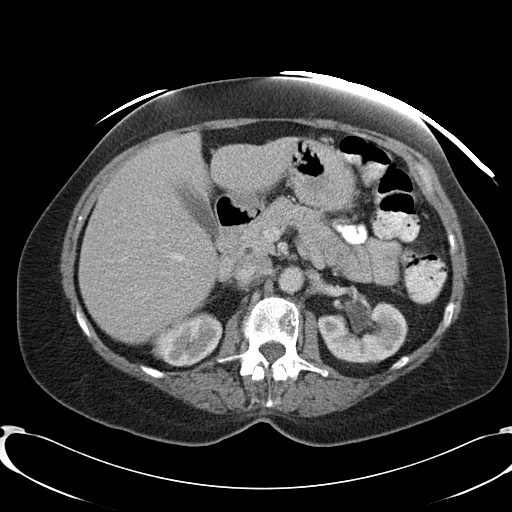
[im 61/94  bone]
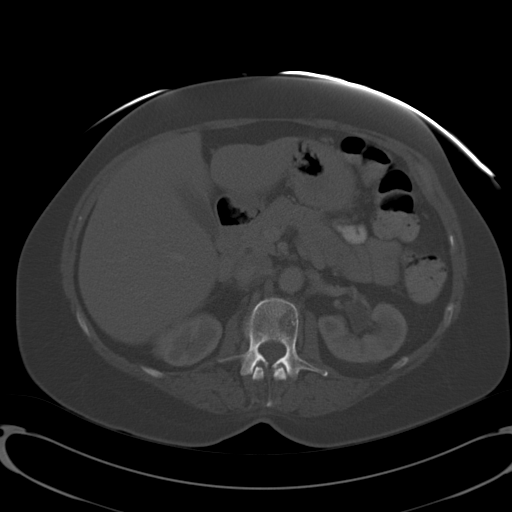
[im 69/94  soft-tissue]
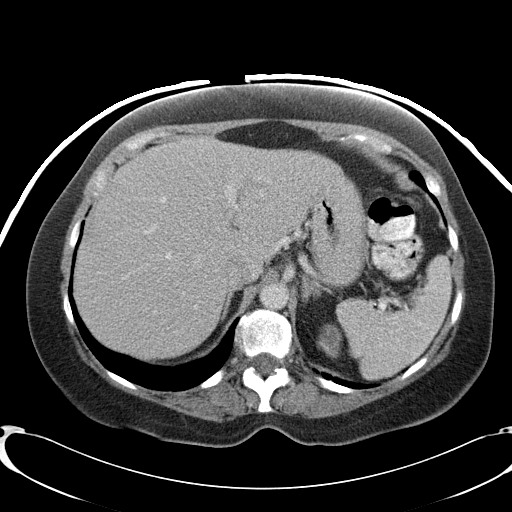
[im 73/94  soft-tissue]
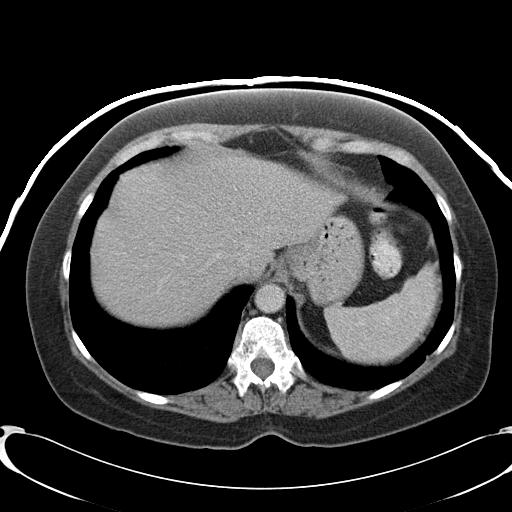
[im 77/94  lung]
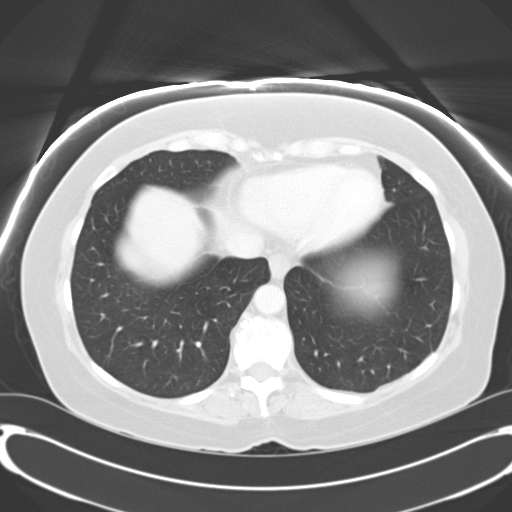
[im 81/94  soft-tissue]
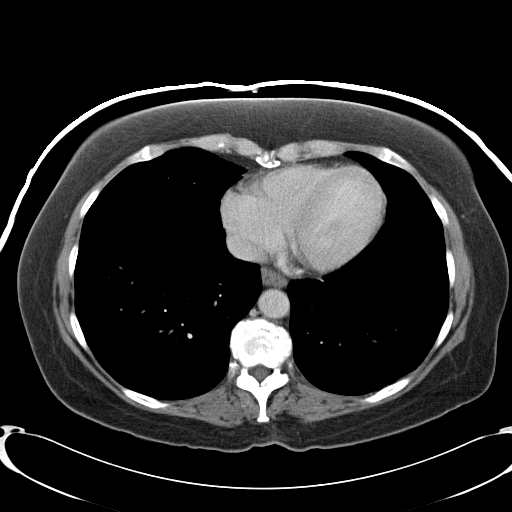
[im 81/94  lung]
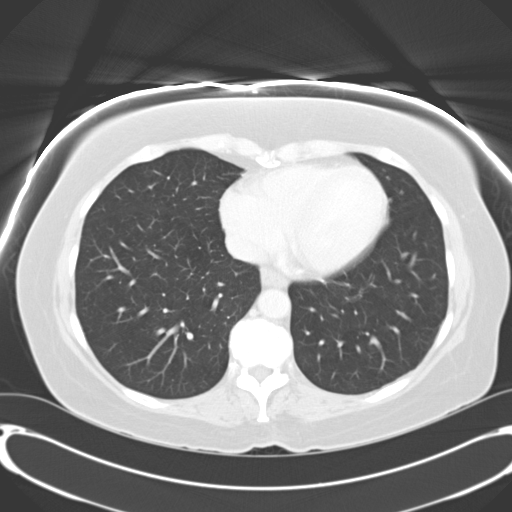
[im 85/94  lung]
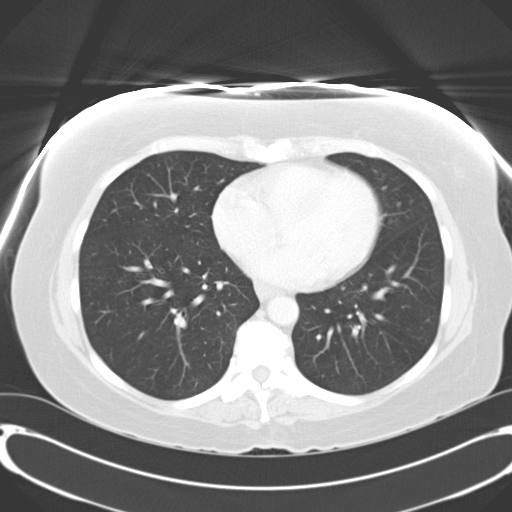
[im 89/94  soft-tissue]
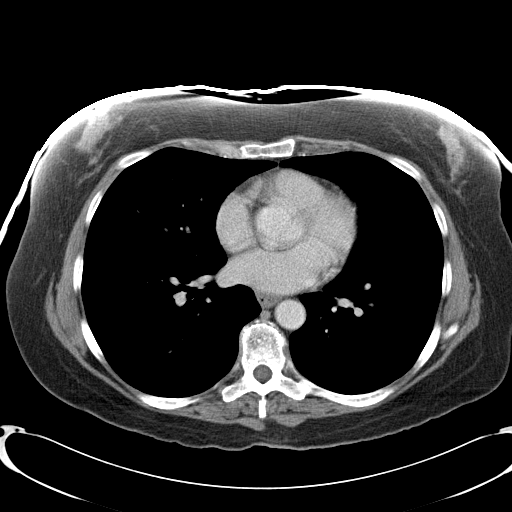
[im 89/94  lung]
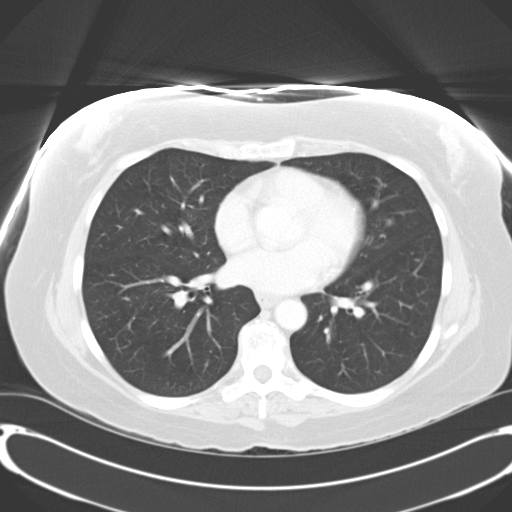

[15 of 32 positions shown; findings below may reference images not displayed]

FINDINGS: Visualized lung bases clear.

Unremarkable liver, gallbladder, spleen, adrenal glands.  Stable
cyst in the upper pole left kidney.  Remainder of renal parenchyma
unremarkable.  Normal pancreas.

There are number of sub centimeter left para-aortic lymph nodes
which were present on the previous exam.

Stomach and small bowel decompressed.  The colon is nondistended.
There are inflammatory/edematous changes around the proximal
sigmoid colon.  There has been some enlargement of the regional
pericolonic mesenteric lymph nodes.  No evidence of abscess.

Urinary bladder incompletely distended.  Previous hysterectomy.  No
free air.  No ascites.  Normal appendix.

Mild lumbar spine levoscoliosis with multilevel degenerative
changes.
IMPRESSION: 1.  Inflammatory/edematous changes around the proximal sigmoid
colon consistent with diverticulitis; no evidence of abscess.
2.  Interval increase in size of mesenteric lymph nodes around the
proximal sigmoid colon, presumably reactive.  Consider follow-up
colonoscopy after the acute episode has passed to exclude any
underlying associated mucosal lesion.

I reviewed the   test results with Dr. Sima at the time of
interpretation.

## 2012-10-04 ENCOUNTER — Other Ambulatory Visit: Payer: Self-pay

## 2012-10-04 DIAGNOSIS — Z1231 Encounter for screening mammogram for malignant neoplasm of breast: Secondary | ICD-10-CM

## 2012-12-19 ENCOUNTER — Ambulatory Visit
Admission: RE | Admit: 2012-12-19 | Discharge: 2012-12-19 | Disposition: A | Discharge status: Home / Self Care | Payer: Medicare Other | Source: Ambulatory Visit

## 2012-12-19 DIAGNOSIS — Z1231 Encounter for screening mammogram for malignant neoplasm of breast: Secondary | ICD-10-CM

## 2012-12-25 DIAGNOSIS — L821 Other seborrheic keratosis: Secondary | ICD-10-CM | POA: Diagnosis not present

## 2012-12-25 DIAGNOSIS — L739 Follicular disorder, unspecified: Secondary | ICD-10-CM | POA: Diagnosis not present

## 2012-12-25 DIAGNOSIS — D1801 Hemangioma of skin and subcutaneous tissue: Secondary | ICD-10-CM | POA: Diagnosis not present

## 2012-12-25 DIAGNOSIS — Z85828 Personal history of other malignant neoplasm of skin: Secondary | ICD-10-CM | POA: Diagnosis not present

## 2013-01-26 DIAGNOSIS — IMO0002 Reserved for concepts with insufficient information to code with codable children: Secondary | ICD-10-CM | POA: Diagnosis not present

## 2013-01-26 DIAGNOSIS — E039 Hypothyroidism, unspecified: Secondary | ICD-10-CM | POA: Diagnosis not present

## 2013-01-26 DIAGNOSIS — F329 Major depressive disorder, single episode, unspecified: Secondary | ICD-10-CM | POA: Diagnosis not present

## 2013-01-26 DIAGNOSIS — R51 Headache: Secondary | ICD-10-CM | POA: Diagnosis not present

## 2013-02-20 ENCOUNTER — Ambulatory Visit (INDEPENDENT_AMBULATORY_CARE_PROVIDER_SITE_OTHER): Payer: Medicare Other | Admitting: Gastroenterology

## 2013-02-20 ENCOUNTER — Other Ambulatory Visit (INDEPENDENT_AMBULATORY_CARE_PROVIDER_SITE_OTHER): Payer: Medicare Other

## 2013-02-20 ENCOUNTER — Encounter: Payer: Self-pay | Admitting: Gastroenterology

## 2013-02-20 ENCOUNTER — Telehealth: Payer: Self-pay | Admitting: Gastroenterology

## 2013-02-20 VITALS — BP 140/100 | HR 80 | Temp 97.3°F | Ht 67.75 in

## 2013-02-20 DIAGNOSIS — R109 Unspecified abdominal pain: Secondary | ICD-10-CM

## 2013-02-20 DIAGNOSIS — Z9889 Other specified postprocedural states: Secondary | ICD-10-CM

## 2013-02-20 DIAGNOSIS — Z9049 Acquired absence of other specified parts of digestive tract: Secondary | ICD-10-CM

## 2013-02-20 LAB — HEPATIC FUNCTION PANEL
ALT: 15 U/L (ref 0–35)
AST: 17 U/L (ref 0–37)
Albumin: 4.2 g/dL (ref 3.5–5.2)
Alkaline Phosphatase: 31 U/L — ABNORMAL LOW (ref 39–117)
Bilirubin, Direct: 0.1 mg/dL (ref 0.0–0.3)
Total Bilirubin: 0.8 mg/dL (ref 0.3–1.2)
Total Protein: 6.8 g/dL (ref 6.0–8.3)

## 2013-02-20 LAB — CBC WITH DIFFERENTIAL/PLATELET
Basophils Absolute: 0.1 10*3/uL (ref 0.0–0.1)
Basophils Relative: 0.9 % (ref 0.0–3.0)
Eosinophils Absolute: 0.1 10*3/uL (ref 0.0–0.7)
Eosinophils Relative: 2.5 % (ref 0.0–5.0)
HCT: 38.1 % (ref 36.0–46.0)
Hemoglobin: 13 g/dL (ref 12.0–15.0)
Lymphocytes Relative: 22.6 % (ref 12.0–46.0)
Lymphs Abs: 1.3 10*3/uL (ref 0.7–4.0)
MCHC: 34 g/dL (ref 30.0–36.0)
MCV: 91.9 fl (ref 78.0–100.0)
Monocytes Absolute: 0.5 10*3/uL (ref 0.1–1.0)
Monocytes Relative: 8.3 % (ref 3.0–12.0)
Neutro Abs: 3.7 10*3/uL (ref 1.4–7.7)
Neutrophils Relative %: 65.7 % (ref 43.0–77.0)
Platelets: 307 10*3/uL (ref 150.0–400.0)
RBC: 4.15 Mil/uL (ref 3.87–5.11)
RDW: 13.4 % (ref 11.5–14.6)
WBC: 5.6 10*3/uL (ref 4.5–10.5)

## 2013-02-20 LAB — IBC PANEL
Iron: 82 ug/dL (ref 42–145)
Saturation Ratios: 22.8 % (ref 20.0–50.0)
Transferrin: 256.7 mg/dL (ref 212.0–360.0)

## 2013-02-20 LAB — URINALYSIS
Bilirubin Urine: NEGATIVE
Hgb urine dipstick: NEGATIVE
Ketones, ur: NEGATIVE
Leukocytes, UA: NEGATIVE
Nitrite: NEGATIVE
Specific Gravity, Urine: 1.01 (ref 1.000–1.030)
Total Protein, Urine: NEGATIVE
Urine Glucose: NEGATIVE
Urobilinogen, UA: 0.2 (ref 0.0–1.0)
pH: 6 (ref 5.0–8.0)

## 2013-02-20 LAB — FOLATE: Folate: 9.3 ng/mL (ref >5.9)

## 2013-02-20 LAB — BASIC METABOLIC PANEL
BUN: 23 mg/dL (ref 6–23)
CO2: 24 mEq/L (ref 19–32)
Calcium: 9.4 mg/dL (ref 8.4–10.5)
Chloride: 107 mEq/L (ref 96–112)
Creatinine, Ser: 1 mg/dL (ref 0.4–1.2)
GFR: 61.7 mL/min (ref >60.00)
Glucose, Bld: 87 mg/dL (ref 70–99)
Potassium: 3.9 mEq/L (ref 3.5–5.1)
Sodium: 138 mEq/L (ref 135–145)

## 2013-02-20 LAB — TSH: TSH: 0.95 u[IU]/mL (ref 0.35–5.50)

## 2013-02-20 LAB — AMYLASE: Amylase: 88 U/L (ref 27–131)

## 2013-02-20 LAB — VITAMIN B12: Vitamin B-12: 212 pg/mL (ref 211–911)

## 2013-02-20 LAB — SEDIMENTATION RATE: Sed Rate: 10 mm/hr (ref 0–22)

## 2013-02-20 LAB — LIPASE: Lipase: 25 U/L (ref 11.0–59.0)

## 2013-02-20 LAB — FERRITIN: Ferritin: 28.3 ng/mL (ref 10.0–291.0)

## 2013-02-20 MED ORDER — HYOSCYAMINE SULFATE 0.125 MG SL SUBL: 0.1250 mg | SUBLINGUAL_TABLET | SUBLINGUAL | Status: DC | PRN

## 2013-02-20 MED ORDER — HYDROCODONE-ACETAMINOPHEN 5-325 MG PO TABS: 1.0000 | ORAL_TABLET | Freq: Four times a day (QID) | ORAL | Status: DC | PRN

## 2013-02-20 NOTE — Telephone Encounter (Signed)
Pt c/o of lower abdominal pain that started on the right then radiated to left, went away and is now back. She denies diarrhea or a temp. She will see Dr Jarold Motto today per his permission.

## 2013-02-20 NOTE — Patient Instructions (Signed)
You have been scheduled for a CT scan of the abdomen and pelvis at [facility] ([address]---this is in the same building as [facility]).   You are scheduled on 02-23-2013 at 10 am. You should arrive 15 minutes prior to your appointment time for registration. Please follow the written instructions below on the day of your exam:  WARNING: IF YOU ARE ALLERGIC TO IODINE/X-RAY DYE, PLEASE NOTIFY RADIOLOGY IMMEDIATELY AT [phone]! YOU WILL BE GIVEN A 13 HOUR PREMEDICATION PREP.  1) Do not eat or drink anything after 6 am (4 hours prior to your test) 2) You have been given 2 bottles of oral contrast to drink. The solution may taste better if refrigerated, but do NOT add ice or any other liquid to this solution. Shake well before drinking.    Drink 1 bottle of contrast @ 8 am (2 hours prior to your exam)  Drink 1 bottle of contrast @ 9 am (1 hour prior to your exam)  You may take any medications as prescribed with a small amount of water except for the following: Metformin, Glucophage, Glucovance, Avandamet, Riomet, Fortamet, Actoplus Met, Janumet, Glumetza or Metaglip. The above medications must be held the day of the exam AND 48 hours after the exam.  The purpose of you drinking the oral contrast is to aid in the visualization of your intestinal tract. The contrast solution may cause some diarrhea. Before your exam is started, you will be given a small amount of fluid to drink. Depending on your individual set of symptoms, you may also receive an intravenous injection of x-ray contrast/dye. Plan on being at [facility] for 30 minutes or long, depending on the type of exam you are having performed.  This test typically takes 30-45 minutes to complete.  If you have any questions regarding your exam or if you need to reschedule, you may call the CT department at [phone] between the hours of 8:00 am and 5:00 pm,  Monday-Friday.  __________________________________________________________________________________________________  Your physician has requested that you go to the basement for the following lab work before leaving today: Urinalysis Amylase Lipase Sedimentation Rate Liver Function Anemia Panel CBC  TSH  BMP  We have sent the following medications to your pharmacy for you to pick up at your convenience: Levsin, please take every four hours as needed   Prescription given to you today for Hydrocodone

## 2013-02-20 NOTE — Progress Notes (Signed)
This is a 72 year old Caucasian female status post laparoscopic left hemicolectomy in [facility], and atherosclerotic cholecystectomy in February of 2014, who now presents with one-week of recurrent diffuse abdominal pain described as a severe aching pain over entire abdomen, but tends to localize in her right upper quadrant going into her right lower quadrant. Pain lasts several hours in duration has no real precipitating or alleviating elements. She specifically denies upper GI complaints, any hepatobiliary symptoms, acid reflux, change in bowel habit, melena or hematochezia. There is no history of NSAID, cigarette, alcohol abuse. She has similar pain in February of this year, was found to have cholelithiasis, and underwent laparoscopic cholecystectomy, had a negative intraoperative cholangiogram. She specifically denies clay color stools, dark urine, icterus, fever chills. He also is no history of any genitourinary complaints. Last CT scan was in February of 2014. The patient is on chronic Nexium 40 mg a day for acid reflux symptoms. She again denies any dyspepsia or acid reflux at this time. She is status post hysterectomy also. No history of gynecologic problems or gynecologic symptomatology.   Current Medications, Allergies, Past Medical History, Past Surgical History, Family History and Social History were reviewed in [employer] record.  ROS: All systems were reviewed and are negative unless otherwise stated in the HPI.          Physical healthy-appearing patient in no acute distress appears stated age. Blood pressure 140/100, pulse 80 and regular she is afebrile, and weight not recorded. An appreciate stigmata of chronic liver disease. Her abdomen is not distended and there is no organomegaly, masses, or localized tenderness. Bowel sounds are normal and nonobstructive. Inspectum of rectum shows a posterior skin tag but otherwise is unremarkable as is digital exam.  Stool is normal color and guaiac-negative. No status is normal although the patient is somewhat distressed about the diagnosis of her abdominal pain.    Assessment and Plan: Review of this patient's record shows a similar episode of pain earlier this year that resulted in laparoscopic cholecystectomy after gallstones were determined.. The etiology the current pain is unclear, but it certainly sound like she has intestinal adhesions perhaps related to previous episodes of diverticulitis and/or cholecystitis. Her symptoms certainly do not parallel any objective findings. I have ordered CBC, sedimentation rate, amylase, lipase, and liver profile and we'll repeat her CT scan of the abdomen and pelvis, also urinalysis. I placed her on hydrocodone 5-10 mg every 6 hours when necessary, low fiber diet, and when necessary sublingual Levsin pending further workup. She has a severe attack of pain, she will need to go to the emergency room for evaluation, flat and upright KUB, and perhaps surgical consultation.. Colonoscopy in February after her surgery showed some residual diverticulosis but otherwise was unremarkable. Reluctant to prescribe antibiotics until lab data and CT scan is available. As above, abdominal exam is unremarkable at this time. I do not believe she's had previous appendectomy.   Cc: Cyndia Bent a [facility] and Dr. Creola Corn at [facility]

## 2013-02-22 ENCOUNTER — Other Ambulatory Visit: Payer: Self-pay | Admitting: *Deleted

## 2013-02-22 ENCOUNTER — Ambulatory Visit (INDEPENDENT_AMBULATORY_CARE_PROVIDER_SITE_OTHER): Payer: Medicare Other | Admitting: Gastroenterology

## 2013-02-22 DIAGNOSIS — E538 Deficiency of other specified B group vitamins: Secondary | ICD-10-CM

## 2013-02-22 MED ORDER — CYANOCOBALAMIN 1000 MCG/ML IJ SOLN: INTRAMUSCULAR | Status: DC

## 2013-02-22 MED ORDER — CYANOCOBALAMIN 1000 MCG/ML IJ SOLN
1000.0000 ug | INTRAMUSCULAR | Status: AC
  Administered 2013-02-22 – 2013-03-07 (×2): 1000 ug via INTRAMUSCULAR

## 2013-02-23 ENCOUNTER — Ambulatory Visit (INDEPENDENT_AMBULATORY_CARE_PROVIDER_SITE_OTHER)
Admission: RE | Admit: 2013-02-23 | Discharge: 2013-02-23 | Disposition: A | Discharge status: Home / Self Care | Payer: Medicare Other | Source: Ambulatory Visit | Attending: Gastroenterology | Admitting: Gastroenterology

## 2013-02-23 ENCOUNTER — Ambulatory Visit: Payer: Medicare Other | Admitting: Gastroenterology

## 2013-02-23 DIAGNOSIS — R109 Unspecified abdominal pain: Secondary | ICD-10-CM | POA: Diagnosis not present

## 2013-02-23 MED ORDER — IOHEXOL 300 MG/ML  SOLN
100.0000 mL | Freq: Once | INTRAMUSCULAR | Status: AC | PRN
  Administered 2013-02-23: 100 mL via INTRAVENOUS

## 2013-02-28 ENCOUNTER — Ambulatory Visit (INDEPENDENT_AMBULATORY_CARE_PROVIDER_SITE_OTHER): Payer: Medicare Other | Admitting: Gastroenterology

## 2013-02-28 DIAGNOSIS — E538 Deficiency of other specified B group vitamins: Secondary | ICD-10-CM | POA: Diagnosis not present

## 2013-02-28 MED ORDER — CYANOCOBALAMIN 1000 MCG/ML IJ SOLN
1000.0000 ug | Freq: Once | INTRAMUSCULAR | Status: AC
  Administered 2013-02-28: 1000 ug via INTRAMUSCULAR

## 2013-03-07 ENCOUNTER — Ambulatory Visit (INDEPENDENT_AMBULATORY_CARE_PROVIDER_SITE_OTHER): Payer: Medicare Other | Admitting: Gastroenterology

## 2013-03-07 DIAGNOSIS — E538 Deficiency of other specified B group vitamins: Secondary | ICD-10-CM

## 2013-03-07 MED ORDER — CYANOCOBALAMIN 1000 MCG/ML IJ SOLN: 1000.0000 ug | Freq: Once | INTRAMUSCULAR | Status: DC

## 2013-04-09 ENCOUNTER — Ambulatory Visit (INDEPENDENT_AMBULATORY_CARE_PROVIDER_SITE_OTHER): Payer: Medicare Other | Admitting: Gastroenterology

## 2013-04-09 DIAGNOSIS — E538 Deficiency of other specified B group vitamins: Secondary | ICD-10-CM | POA: Diagnosis not present

## 2013-04-09 MED ORDER — CYANOCOBALAMIN 1000 MCG/ML IJ SOLN
1000.0000 ug | Freq: Once | INTRAMUSCULAR | Status: AC
  Administered 2013-04-09: 1000 ug via INTRAMUSCULAR

## 2013-04-23 DIAGNOSIS — R059 Cough, unspecified: Secondary | ICD-10-CM | POA: Diagnosis not present

## 2013-04-23 DIAGNOSIS — R05 Cough: Secondary | ICD-10-CM | POA: Diagnosis not present

## 2013-04-23 DIAGNOSIS — R5383 Other fatigue: Secondary | ICD-10-CM | POA: Diagnosis not present

## 2013-04-23 DIAGNOSIS — IMO0002 Reserved for concepts with insufficient information to code with codable children: Secondary | ICD-10-CM | POA: Diagnosis not present

## 2013-04-23 DIAGNOSIS — R5381 Other malaise: Secondary | ICD-10-CM | POA: Diagnosis not present

## 2013-05-10 ENCOUNTER — Ambulatory Visit (INDEPENDENT_AMBULATORY_CARE_PROVIDER_SITE_OTHER): Payer: Medicare Other | Admitting: Gastroenterology

## 2013-05-10 DIAGNOSIS — E538 Deficiency of other specified B group vitamins: Secondary | ICD-10-CM

## 2013-05-10 MED ORDER — CYANOCOBALAMIN 1000 MCG/ML IJ SOLN
1000.0000 ug | Freq: Once | INTRAMUSCULAR | Status: AC
  Administered 2013-05-10: 1000 ug via INTRAMUSCULAR

## 2013-06-12 ENCOUNTER — Ambulatory Visit (INDEPENDENT_AMBULATORY_CARE_PROVIDER_SITE_OTHER): Payer: Medicare Other | Admitting: Gastroenterology

## 2013-06-12 DIAGNOSIS — E538 Deficiency of other specified B group vitamins: Secondary | ICD-10-CM

## 2013-06-12 MED ORDER — CYANOCOBALAMIN 1000 MCG/ML IJ SOLN
1000.0000 ug | INTRAMUSCULAR | Status: AC
  Administered 2013-06-12 – 2013-09-17 (×4): 1000 ug via INTRAMUSCULAR

## 2013-07-16 ENCOUNTER — Ambulatory Visit (INDEPENDENT_AMBULATORY_CARE_PROVIDER_SITE_OTHER): Payer: Medicare Other | Admitting: Gastroenterology

## 2013-07-16 DIAGNOSIS — E538 Deficiency of other specified B group vitamins: Secondary | ICD-10-CM

## 2013-07-26 DIAGNOSIS — E039 Hypothyroidism, unspecified: Secondary | ICD-10-CM | POA: Diagnosis not present

## 2013-07-26 DIAGNOSIS — R030 Elevated blood-pressure reading, without diagnosis of hypertension: Secondary | ICD-10-CM | POA: Diagnosis not present

## 2013-08-02 DIAGNOSIS — K219 Gastro-esophageal reflux disease without esophagitis: Secondary | ICD-10-CM | POA: Diagnosis not present

## 2013-08-02 DIAGNOSIS — M1990 Unspecified osteoarthritis, unspecified site: Secondary | ICD-10-CM | POA: Diagnosis not present

## 2013-08-02 DIAGNOSIS — E039 Hypothyroidism, unspecified: Secondary | ICD-10-CM | POA: Diagnosis not present

## 2013-08-02 DIAGNOSIS — R030 Elevated blood-pressure reading, without diagnosis of hypertension: Secondary | ICD-10-CM | POA: Diagnosis not present

## 2013-08-02 DIAGNOSIS — G4700 Insomnia, unspecified: Secondary | ICD-10-CM | POA: Diagnosis not present

## 2013-08-02 DIAGNOSIS — Z1331 Encounter for screening for depression: Secondary | ICD-10-CM | POA: Diagnosis not present

## 2013-08-02 DIAGNOSIS — Z0000 Encounter for general adult medical examination without abnormal findings: Secondary | ICD-10-CM | POA: Diagnosis not present

## 2013-08-02 DIAGNOSIS — E538 Deficiency of other specified B group vitamins: Secondary | ICD-10-CM | POA: Diagnosis not present

## 2013-08-02 DIAGNOSIS — Z23 Encounter for immunization: Secondary | ICD-10-CM | POA: Diagnosis not present

## 2013-08-08 DIAGNOSIS — K219 Gastro-esophageal reflux disease without esophagitis: Secondary | ICD-10-CM | POA: Diagnosis not present

## 2013-08-08 DIAGNOSIS — Z23 Encounter for immunization: Secondary | ICD-10-CM | POA: Diagnosis not present

## 2013-08-08 DIAGNOSIS — M1990 Unspecified osteoarthritis, unspecified site: Secondary | ICD-10-CM | POA: Diagnosis not present

## 2013-08-08 DIAGNOSIS — E538 Deficiency of other specified B group vitamins: Secondary | ICD-10-CM | POA: Diagnosis not present

## 2013-08-08 DIAGNOSIS — Z0000 Encounter for general adult medical examination without abnormal findings: Secondary | ICD-10-CM | POA: Diagnosis not present

## 2013-08-08 DIAGNOSIS — R030 Elevated blood-pressure reading, without diagnosis of hypertension: Secondary | ICD-10-CM | POA: Diagnosis not present

## 2013-08-08 DIAGNOSIS — G4700 Insomnia, unspecified: Secondary | ICD-10-CM | POA: Diagnosis not present

## 2013-08-08 DIAGNOSIS — E039 Hypothyroidism, unspecified: Secondary | ICD-10-CM | POA: Diagnosis not present

## 2013-08-16 ENCOUNTER — Ambulatory Visit (INDEPENDENT_AMBULATORY_CARE_PROVIDER_SITE_OTHER): Payer: Medicare Other | Admitting: Gastroenterology

## 2013-08-16 DIAGNOSIS — E538 Deficiency of other specified B group vitamins: Secondary | ICD-10-CM

## 2013-08-16 MED ORDER — CYANOCOBALAMIN 1000 MCG/ML IJ SOLN: 1000.0000 ug | Freq: Once | INTRAMUSCULAR | Status: DC

## 2013-09-17 ENCOUNTER — Ambulatory Visit (INDEPENDENT_AMBULATORY_CARE_PROVIDER_SITE_OTHER): Payer: Medicare Other | Admitting: Gastroenterology

## 2013-09-17 DIAGNOSIS — E538 Deficiency of other specified B group vitamins: Secondary | ICD-10-CM

## 2013-09-17 MED ORDER — CYANOCOBALAMIN 1000 MCG/ML IJ SOLN
1000.0000 ug | INTRAMUSCULAR | Status: DC
Start: 2013-09-17 — End: 2013-09-17

## 2013-10-11 ENCOUNTER — Encounter (HOSPITAL_COMMUNITY): Payer: Self-pay | Admitting: Emergency Medicine

## 2013-10-11 ENCOUNTER — Inpatient Hospital Stay (HOSPITAL_COMMUNITY)
Admission: EM | Admit: 2013-10-11 | Discharge: 2013-10-13 | DRG: 287 | Disposition: A | Discharge status: Home / Self Care | Payer: Medicare Other | Attending: Cardiovascular Disease | Admitting: Cardiovascular Disease

## 2013-10-11 ENCOUNTER — Emergency Department (HOSPITAL_COMMUNITY): Payer: Medicare Other

## 2013-10-11 DIAGNOSIS — Z8249 Family history of ischemic heart disease and other diseases of the circulatory system: Secondary | ICD-10-CM | POA: Diagnosis not present

## 2013-10-11 DIAGNOSIS — K449 Diaphragmatic hernia without obstruction or gangrene: Secondary | ICD-10-CM | POA: Diagnosis present

## 2013-10-11 DIAGNOSIS — E538 Deficiency of other specified B group vitamins: Secondary | ICD-10-CM | POA: Diagnosis present

## 2013-10-11 DIAGNOSIS — I208 Other forms of angina pectoris: Secondary | ICD-10-CM | POA: Diagnosis present

## 2013-10-11 DIAGNOSIS — I200 Unstable angina: Principal | ICD-10-CM | POA: Diagnosis present

## 2013-10-11 DIAGNOSIS — Z880 Allergy status to penicillin: Secondary | ICD-10-CM | POA: Diagnosis not present

## 2013-10-11 DIAGNOSIS — Z87891 Personal history of nicotine dependence: Secondary | ICD-10-CM

## 2013-10-11 DIAGNOSIS — Z7982 Long term (current) use of aspirin: Secondary | ICD-10-CM | POA: Diagnosis not present

## 2013-10-11 DIAGNOSIS — R079 Chest pain, unspecified: Secondary | ICD-10-CM | POA: Diagnosis not present

## 2013-10-11 DIAGNOSIS — Z881 Allergy status to other antibiotic agents status: Secondary | ICD-10-CM

## 2013-10-11 DIAGNOSIS — I10 Essential (primary) hypertension: Secondary | ICD-10-CM | POA: Diagnosis not present

## 2013-10-11 DIAGNOSIS — I209 Angina pectoris, unspecified: Secondary | ICD-10-CM | POA: Diagnosis not present

## 2013-10-11 DIAGNOSIS — G43709 Chronic migraine without aura, not intractable, without status migrainosus: Secondary | ICD-10-CM | POA: Diagnosis present

## 2013-10-11 DIAGNOSIS — I2089 Other forms of angina pectoris: Secondary | ICD-10-CM | POA: Diagnosis present

## 2013-10-11 DIAGNOSIS — K219 Gastro-esophageal reflux disease without esophagitis: Secondary | ICD-10-CM | POA: Diagnosis present

## 2013-10-11 DIAGNOSIS — R072 Precordial pain: Secondary | ICD-10-CM | POA: Diagnosis not present

## 2013-10-11 DIAGNOSIS — R0602 Shortness of breath: Secondary | ICD-10-CM | POA: Diagnosis not present

## 2013-10-11 LAB — CREATININE, SERUM
Creatinine, Ser: 1.12 mg/dL — ABNORMAL HIGH (ref 0.50–1.10)
GFR calc Af Amer: 56 mL/min — ABNORMAL LOW (ref >90)
GFR calc non Af Amer: 48 mL/min — ABNORMAL LOW (ref >90)

## 2013-10-11 LAB — CBC
HCT: 38.8 % (ref 36.0–46.0)
HCT: 37.5 % (ref 36.0–46.0)
Hemoglobin: 13.4 g/dL (ref 12.0–15.0)
Hemoglobin: 12.6 g/dL (ref 12.0–15.0)
MCH: 31.2 pg (ref 26.0–34.0)
MCH: 30.6 pg (ref 26.0–34.0)
MCHC: 34.5 g/dL (ref 30.0–36.0)
MCHC: 33.6 g/dL (ref 30.0–36.0)
MCV: 90.4 fL (ref 78.0–100.0)
MCV: 91 fL (ref 78.0–100.0)
Platelets: 276 10*3/uL (ref 150–400)
Platelets: 262 10*3/uL (ref 150–400)
RBC: 4.29 MIL/uL (ref 3.87–5.11)
RBC: 4.12 MIL/uL (ref 3.87–5.11)
RDW: 12.7 % (ref 11.5–15.5)
RDW: 12.6 % (ref 11.5–15.5)
WBC: 4.6 10*3/uL (ref 4.0–10.5)
WBC: 5.8 10*3/uL (ref 4.0–10.5)

## 2013-10-11 LAB — BASIC METABOLIC PANEL
Anion gap: 17 — ABNORMAL HIGH (ref 5–15)
BUN: 17 mg/dL (ref 6–23)
CO2: 19 mEq/L (ref 19–32)
Calcium: 9.4 mg/dL (ref 8.4–10.5)
Chloride: 99 mEq/L (ref 96–112)
Creatinine, Ser: 1.08 mg/dL (ref 0.50–1.10)
GFR calc Af Amer: 58 mL/min — ABNORMAL LOW (ref >90)
GFR calc non Af Amer: 50 mL/min — ABNORMAL LOW (ref >90)
Glucose, Bld: 89 mg/dL (ref 70–99)
Potassium: 3.5 mEq/L — ABNORMAL LOW (ref 3.7–5.3)
Sodium: 135 mEq/L — ABNORMAL LOW (ref 137–147)

## 2013-10-11 LAB — TROPONIN I: Troponin I: <0.3 ng/mL (ref <0.30)

## 2013-10-11 LAB — TSH: TSH: 1.93 u[IU]/mL (ref 0.350–4.500)

## 2013-10-11 LAB — D-DIMER, QUANTITATIVE (NOT AT ARMC): D-Dimer, Quant: <0.27 ug/mL-FEU (ref 0.00–0.48)

## 2013-10-11 LAB — PROTIME-INR
INR: 0.96 (ref 0.00–1.49)
Prothrombin Time: 12.8 seconds (ref 11.6–15.2)

## 2013-10-11 LAB — I-STAT TROPONIN, ED: Troponin i, poc: 0 ng/mL (ref 0.00–0.08)

## 2013-10-11 LAB — MAGNESIUM: Magnesium: 2.3 mg/dL (ref 1.5–2.5)

## 2013-10-11 MED ORDER — ONDANSETRON HCL 4 MG/2ML IJ SOLN: 4.0000 mg | Freq: Four times a day (QID) | INTRAMUSCULAR | Status: DC | PRN

## 2013-10-11 MED ORDER — MORPHINE SULFATE 2 MG/ML IJ SOLN
2.0000 mg | Freq: Once | INTRAMUSCULAR | Status: AC
  Administered 2013-10-11: 2 mg via INTRAVENOUS
  Filled 2013-10-11: qty 1

## 2013-10-11 MED ORDER — BUPROPION HCL ER (SR) 150 MG PO TB12
150.0000 mg | ORAL_TABLET | Freq: Every morning | ORAL | Status: DC
  Administered 2013-10-12: 150 mg via ORAL
  Filled 2013-10-11 (×2): qty 1

## 2013-10-11 MED ORDER — SODIUM CHLORIDE 0.9 % IJ SOLN: 3.0000 mL | INTRAMUSCULAR | Status: DC | PRN

## 2013-10-11 MED ORDER — ACETAMINOPHEN 325 MG PO TABS
650.0000 mg | ORAL_TABLET | ORAL | Status: DC | PRN
  Administered 2013-10-12 (×2): 650 mg via ORAL
  Filled 2013-10-11 (×2): qty 2

## 2013-10-11 MED ORDER — SODIUM CHLORIDE 0.9 % IJ SOLN
3.0000 mL | Freq: Two times a day (BID) | INTRAMUSCULAR | Status: DC
  Administered 2013-10-11 – 2013-10-12 (×2): 3 mL via INTRAVENOUS

## 2013-10-11 MED ORDER — NITROGLYCERIN 0.4 MG SL SUBL: 0.4000 mg | SUBLINGUAL_TABLET | SUBLINGUAL | Status: DC | PRN

## 2013-10-11 MED ORDER — METOPROLOL TARTRATE 25 MG PO TABS
25.0000 mg | ORAL_TABLET | Freq: Two times a day (BID) | ORAL | Status: DC
  Administered 2013-10-11: 25 mg via ORAL
  Filled 2013-10-11 (×3): qty 1

## 2013-10-11 MED ORDER — PANTOPRAZOLE SODIUM 40 MG PO TBEC
80.0000 mg | DELAYED_RELEASE_TABLET | Freq: Every day | ORAL | Status: DC
  Administered 2013-10-12: 80 mg via ORAL
  Filled 2013-10-11: qty 2

## 2013-10-11 MED ORDER — ASPIRIN 81 MG PO CHEW
324.0000 mg | CHEWABLE_TABLET | ORAL | Status: AC
  Administered 2013-10-11: 324 mg via ORAL
  Filled 2013-10-11 (×3): qty 4

## 2013-10-11 MED ORDER — ZOLPIDEM TARTRATE 5 MG PO TABS: 5.0000 mg | ORAL_TABLET | Freq: Every evening | ORAL | Status: DC | PRN

## 2013-10-11 MED ORDER — ASPIRIN EC 325 MG PO TBEC: 325.0000 mg | DELAYED_RELEASE_TABLET | Freq: Every day | ORAL | Status: DC

## 2013-10-11 MED ORDER — MORPHINE SULFATE 2 MG/ML IJ SOLN
2.0000 mg | INTRAMUSCULAR | Status: DC | PRN
  Administered 2013-10-11 (×2): 2 mg via INTRAVENOUS
  Filled 2013-10-11 (×3): qty 1

## 2013-10-11 MED ORDER — LEVOTHYROXINE SODIUM 50 MCG PO TABS
50.0000 ug | ORAL_TABLET | Freq: Every day | ORAL | Status: DC
  Administered 2013-10-12 – 2013-10-13 (×2): 50 ug via ORAL
  Filled 2013-10-11 (×4): qty 1

## 2013-10-11 MED ORDER — TOPIRAMATE 25 MG PO CPSP: 75.0000 mg | ORAL_CAPSULE | Freq: Every day | ORAL | Status: DC

## 2013-10-11 MED ORDER — ATORVASTATIN CALCIUM 10 MG PO TABS
10.0000 mg | ORAL_TABLET | Freq: Every day | ORAL | Status: DC
Start: 2013-10-12 — End: 2013-10-13
  Filled 2013-10-11 (×3): qty 1

## 2013-10-11 MED ORDER — VERAPAMIL HCL 120 MG PO TABS
120.0000 mg | ORAL_TABLET | Freq: Two times a day (BID) | ORAL | Status: DC
  Administered 2013-10-11: 120 mg via ORAL
  Filled 2013-10-11 (×4): qty 1

## 2013-10-11 MED ORDER — ALPRAZOLAM 0.5 MG PO TABS
0.5000 mg | ORAL_TABLET | Freq: Every day | ORAL | Status: DC | PRN
  Administered 2013-10-12 (×2): 0.5 mg via ORAL
  Filled 2013-10-11 (×2): qty 1

## 2013-10-11 MED ORDER — SODIUM CHLORIDE 0.9 % IV SOLN: 250.0000 mL | INTRAVENOUS | Status: DC | PRN

## 2013-10-11 MED ORDER — HEPARIN SODIUM (PORCINE) 5000 UNIT/ML IJ SOLN
5000.0000 [IU] | Freq: Three times a day (TID) | INTRAMUSCULAR | Status: DC
  Administered 2013-10-11 – 2013-10-12 (×2): 5000 [IU] via SUBCUTANEOUS
  Filled 2013-10-11 (×10): qty 1

## 2013-10-11 MED ORDER — ASPIRIN 300 MG RE SUPP: 300.0000 mg | RECTAL | Status: AC

## 2013-10-11 MED ORDER — ASPIRIN EC 81 MG PO TBEC
81.0000 mg | DELAYED_RELEASE_TABLET | Freq: Every day | ORAL | Status: DC
  Filled 2013-10-11 (×2): qty 1

## 2013-10-11 MED ORDER — POTASSIUM CHLORIDE CRYS ER 20 MEQ PO TBCR
40.0000 meq | EXTENDED_RELEASE_TABLET | Freq: Once | ORAL | Status: AC
  Administered 2013-10-11: 40 meq via ORAL
  Filled 2013-10-11: qty 2

## 2013-10-11 MED ORDER — TOPIRAMATE 25 MG PO TABS
75.0000 mg | ORAL_TABLET | Freq: Every day | ORAL | Status: DC
  Administered 2013-10-11 – 2013-10-12 (×2): 75 mg via ORAL
  Filled 2013-10-11 (×5): qty 3

## 2013-10-11 NOTE — ED Notes (Addendum)
Pt belongings at bedside are top and bra, shoes, cell phone- pt sent purse home with husband and watch

## 2013-10-11 NOTE — ED Notes (Signed)
Report to floor receiving RN.  Pt pain unchanged.  To floor on monitor with ED tech accompanying. To go to [id number].

## 2013-10-11 NOTE — Progress Notes (Signed)
Patient transferred from the ED via stretcher. Oriented patient to the floor and call bell system. VSS. Will continue to monitor patient to end of shift.

## 2013-10-11 NOTE — ED Notes (Signed)
Attempted report 

## 2013-10-11 NOTE — ED Provider Notes (Signed)
CSN: [id number]     Arrival date & time 10/11/13  1414 History   First MD Initiated Contact with Patient 10/11/13 1533     No chief complaint on file.    (Consider location/radiation/quality/duration/timing/severity/associated sxs/prior Treatment) HPI 73 year old female past medical history diverticulosis but presented today with pressure-like chest pain that is substernal but has components of sharp pain. Patient states that she started feeling this chest pain while at rest at 1 PM today. Patient did have some associated shortness of breath with the pain. After the pain onset she took aspirin. Patient states that the pain has subsided and there is only a minimal amount of pain currently. Denies cough, congestion fevers chills. Patient took no other treatment other than aspirin. Patient states that she's never had similar pain in the past has not had past medical history of cardiac disease only has hypertension. Patient also states that last week she had a minor head trauma and standing up quickly hitting a branch. Patient did have a severe headache 4 days prior to this and subsided on its own. Patient states she has a minor throbbing frontal headache that is minimal to the point that she is not require treatment.  Past Medical History  Diagnosis Date  . Diverticulosis   . Arthritis   . Hypertension   . Weakness   . Hyperparathyroidism   . Diverticulitis   . Gastritis   . Adenomatous colon polyp   . B12 deficiency   . Chronic constipation   . PONV (postoperative nausea and vomiting)     states Zofran works well  . Generalized headaches     states never had until trouble with parathyroidism  . GERD (gastroesophageal reflux disease)   . Diverticulitis of colon     hospitalized March, april and November-   states going to have colon resection in Jan  . Kidney stones   . Hiatal hernia   . Symptomatic cholelithiasis 05/26/2012   Past Surgical History  Procedure Laterality Date  .  Abdominal hysterectomy  2002  . Total knee arthroplasty  2005    bilateral  . Joint replacement      bilateral  . Tonsillectomy    . Parathyroidectomy  03/12/2011    Procedure: PARATHYROIDECTOMY;  Surgeon: Earnstine Regal, MD;  Location: [facility];  Service: General;  Laterality: Left;  Left superior parathyroidectomy, minimally invasive   . Laparoscopic colon resection  04/2011    [facility]  . Vaginal hysterectomy    . Cholecystectomy N/A 05/25/2012    Procedure: LAPAROSCOPIC CHOLECYSTECTOMY WITH INTRAOPERATIVE CHOLANGIOGRAM;  Surgeon: Leighton Ruff, MD;  Location: [facility];  Service: General;  Laterality: N/A;  unable to come before 12   Family History  Problem Relation Age of Onset  . Colon cancer Mother   . Heart failure Mother   . Cancer Mother     colon  . Colon cancer Sister   . Cancer Sister     colon  . Heart disease Father     heart attack following surgery  . Esophageal cancer Neg Hx   . Rectal cancer Neg Hx   . Stomach cancer Neg Hx    History  Substance Use Topics  . Smoking status: Former Smoker    Types: Cigarettes    Quit date: 04/05/2000  . Smokeless tobacco: Never Used     Comment: quit smoking 10 yrs ago  . Alcohol Use: 0.0 oz/week     Comment: weekends and occasional cocktail   OB  History   Grav Para Term Preterm Abortions TAB SAB Ect Mult Living                 Review of Systems  Constitutional: Negative for activity change.  HENT: Negative for congestion.   Respiratory: Positive for chest tightness and shortness of breath. Negative for cough.   Cardiovascular: Positive for chest pain. Negative for leg swelling.  Gastrointestinal: Negative for nausea, vomiting, abdominal pain, diarrhea, constipation, blood in stool and abdominal distention.  Genitourinary: Negative for dysuria, flank pain and vaginal discharge.  Musculoskeletal: Negative for back pain.  Skin: Negative for color change.  Neurological: Positive for light-headedness. Negative for  syncope and headaches.  Psychiatric/Behavioral: Negative for agitation.      Allergies  Ciprofloxacin; Flagyl; Scopolamine; Azactam; Clindamycin/lincomycin; Penicillins; and Moviprep  Home Medications   Prior to Admission medications   Medication Sig Start Date End Date Taking? Authorizing Provider  acetaminophen (TYLENOL) 325 MG tablet Do not take more than 4000 mg of tylenol per day. Vicodin has tylenol in it.(actaminophen) 05/25/12   Earnstine Regal, PA-C  buPROPion [facility]) 150 MG 12 hr tablet Take 150 mg by mouth every morning.    Historical Provider, MD  cyanocobalamin (,VITAMIN B-12,) 1000 MCG/ML injection Inject 1 ml into a muscle once weekly for 3 weeks, then inject 1 ml into muscle monthly for a year. 02/22/13   Sable Feil, MD  cyanocobalamin (,VITAMIN B-12,) 1000 MCG/ML injection Inject 1 mL (1,000 mcg total) into the muscle once. 03/07/13   Sable Feil, MD  esomeprazole (NEXIUM) 40 MG capsule Take 40 mg by mouth daily before breakfast.     Historical Provider, MD  Eszopiclone 3 MG TABS Take 3 mg by mouth at bedtime. Take immediately before bedtime    Historical Provider, MD  HYDROcodone-acetaminophen (NORCO/VICODIN) 5-325 MG per tablet Take 1 tablet by mouth every 6 (six) hours as needed. 02/20/13   Sable Feil, MD  hyoscyamine (LEVSIN SL) 0.125 MG SL tablet Place 1 tablet (0.125 mg total) under the tongue every 4 (four) hours as needed. 02/20/13   Sable Feil, MD  levothyroxine (SYNTHROID, LEVOTHROID) 50 MCG tablet Take 50 mcg by mouth every morning.     Historical Provider, MD  rifaximin (XIFAXAN) 550 MG TABS Take 1 tablet (550 mg total) by mouth 2 (two) times daily. 05/11/12   Sable Feil, MD  topiramate (TOPAMAX) 25 MG capsule Take 50 mg by mouth at bedtime.     Historical Provider, MD  verapamil (CALAN) 120 MG tablet Take 120 mg by mouth 2 (two) times daily.     Historical Provider, MD   BP 145/83  Pulse 75  Temp(Src) 98.5 F (36.9 C)  (Oral)  Resp 18  Wt 152 lb (68.947 kg)  SpO2 97% Physical Exam  Constitutional: She is oriented to person, place, and time. She appears well-developed.  HENT:  Head: Normocephalic.  Eyes: Pupils are equal, round, and reactive to light.  Neck: Neck supple.  Cardiovascular: Normal rate.  Exam reveals no gallop and no friction rub.   No murmur heard. Pulmonary/Chest: Effort normal and breath sounds normal. No respiratory distress.  Abdominal: Soft. She exhibits no distension. There is no tenderness. There is no rebound.  Musculoskeletal: She exhibits no edema.  Neurological: She is alert and oriented to person, place, and time.  Skin: Skin is warm.  Psychiatric: She has a normal mood and affect.   Cranial nerves III-XII grossly intact Strength 5+/5+ to upper and  lower extremities bilaterally with resistance applied, equal distribution noted Strength intact to MCP, PIP, DIP joints of  hand Negative arm drift Fine motor skills intact Heel to knee down shin normal bilaterally Gait proper, proper balance - negative sway, negative drift, negative step-offs    ED Course  Procedures (including critical care time) Labs Review Labs Reviewed  BASIC METABOLIC PANEL - Abnormal; Notable for the following:    Sodium 135 (*)    Potassium 3.5 (*)    GFR calc non Af Amer 50 (*)    GFR calc Af Amer 58 (*)    Anion gap 17 (*)    All other components within normal limits  CREATININE, SERUM - Abnormal; Notable for the following:    Creatinine, Ser 1.12 (*)    GFR calc non Af Amer 48 (*)    GFR calc Af Amer 56 (*)    All other components within normal limits  CBC  TROPONIN I  PROTIME-INR  TSH  MAGNESIUM  D-DIMER, QUANTITATIVE  CBC  TROPONIN I  TROPONIN I  HEMOGLOBIN A1C  LIPID PANEL  CBC  BASIC METABOLIC PANEL  I-STAT TROPOININ, ED    Imaging Review Dg Chest 2 View  10/11/2013   CLINICAL DATA:  Shortness of breath and chest pain.  EXAM: CHEST  2 VIEW  COMPARISON:  PA and lateral  chest 05/24/2012.  FINDINGS: Heart size and mediastinal contours are within normal limits. Both lungs are clear. Visualized skeletal structures are unremarkable.  IMPRESSION: Negative exam.   Electronically Signed   By: Inge Rise M.D.   On: 10/11/2013 16:26     EKG Interpretation None      MDM   Final diagnoses:  Angina at rest   73 year old female with no significant past cardiac medical history the presents with chest pain shortness of breath or lightheadedness. Patient evaluated with troponin chest x-ray CBC and BMP. This workup was negative for significant pathology. EKG demonstrated normal sinus rhythm. Patient was tried with a heart score to determined ACS RULE out indicated. Dr. Burt Knack with cardiology and examined the patient and admit her to his service for observation.     Claudean Severance, MD 10/12/13 [phone]

## 2013-10-11 NOTE — ED Notes (Signed)
Pt presents with onset of R sided chest pain while travelling back from beach to here today.  +shortness of breath.  Pt reports over the past week, she had 2 days of "extreme fatigue" and onset of frontal migraine that was not relieved with vicodin, reports lightheadedness and dizziness with headache.  Pt also reports hitting her head on tree limb to occipital area on Thursday.

## 2013-10-11 NOTE — ED Notes (Signed)
MD Burt Knack aware of patient continued chest pain and states he still wants patient to go to telemetry bed

## 2013-10-11 NOTE — H&P (Signed)
History and Physical  Patient ID: Pamela Snow MRN: [id number], SOB: May 25, 1940 73 y.o. Date of Encounter: 10/11/2013, 6:16 PM  Primary Physician: Precious Reel, MD Primary Cardiologist: new  Chief Complaint: Chest pain  HPI: 73 y.o. female w/ PMHx significant for HTN who presented to [facility] on 10/11/2013 with complaints of chest pain.  The patient has been vacationing at the beach for the past week. She has not felt well. She describes headache, generalized fatigue, and weakness. She does have chronic migraine headaches, but her symptoms over the past week has felt different.  During her drive home today, she developed substernal chest pain around [facility]. Symptoms persisted and she came directly to the emergency department. She describes a squeezing, pressure-like sensation in the center of her chest. The pain has subsided, but does continue to occur with deep inspiration or certain movements. She also complains of associated dyspnea. She did not have diaphoresis, nausea, or vomiting. She has no history of cardiac disease. She has not had reviewed his chest pain. She denies cough, fever, chills, or other complaints.   Past Medical History  Diagnosis Date  . Diverticulosis   . Arthritis   . Hypertension   . Weakness   . Hyperparathyroidism   . Diverticulitis   . Gastritis   . Adenomatous colon polyp   . B12 deficiency   . Chronic constipation   . PONV (postoperative nausea and vomiting)     states Zofran works well  . Generalized headaches     states never had until trouble with parathyroidism  . GERD (gastroesophageal reflux disease)   . Diverticulitis of colon     hospitalized March, april and November-   states going to have colon resection in Jan  . Kidney stones   . Hiatal hernia   . Symptomatic cholelithiasis 05/26/2012     Surgical History:  Past Surgical History  Procedure Laterality Date  . Abdominal hysterectomy  2002  . Total  knee arthroplasty  2005    bilateral  . Joint replacement      bilateral  . Tonsillectomy    . Parathyroidectomy  03/12/2011    Procedure: PARATHYROIDECTOMY;  Surgeon: Earnstine Regal, MD;  Location: [facility];  Service: General;  Laterality: Left;  Left superior parathyroidectomy, minimally invasive   . Laparoscopic colon resection  04/2011    [facility];diverticullitis  . Vaginal hysterectomy    . Cholecystectomy N/A 05/25/2012    Procedure: LAPAROSCOPIC CHOLECYSTECTOMY WITH INTRAOPERATIVE CHOLANGIOGRAM;  Surgeon: Leighton Ruff, MD;  Location: [facility];  Service: General;  Laterality: N/A;  unable to come before 12     Home Meds: Prior to Admission medications   Medication Sig Start Date End Date Taking? Authorizing Merelin Human  ALPRAZolam Duanne Moron) 0.5 MG tablet Take 0.5 mg by mouth daily as needed for anxiety.   Yes Historical Maycee Blasco, MD  aspirin EC 325 MG tablet Take 325 mg by mouth daily.   Yes Historical Ed Rayson, MD  buPROPion (WELLBUTRIN SR) 150 MG 12 hr tablet Take 150 mg by mouth every morning.   Yes Historical Sarahi Borland, MD  cyanocobalamin (,VITAMIN B-12,) 1000 MCG/ML injection Inject 1,000 mcg into the muscle every 30 (thirty) days. Inject 1 ml into a muscle once weekly for 3 weeks, then inject 1 ml into muscle monthly for a year. 02/22/13  Yes Sable Feil, MD  esomeprazole (NEXIUM) 40 MG capsule Take 40 mg by mouth daily before breakfast.    Yes Historical  Porschea Borys, MD  Eszopiclone (ESZOPICLONE) 3 MG TABS Take 3 mg by mouth at bedtime as needed (for sleep). Take immediately before bedtime   Yes Historical Cassey Hurrell, MD  Eszopiclone (LUNESTA PO) Take by mouth.   Yes Historical Clover Feehan, MD  HYDROcodone-acetaminophen (NORCO/VICODIN) 5-325 MG per tablet Take 1 tablet by mouth every 6 (six) hours as needed for moderate pain.   Yes Historical Deontrey Massi, MD  levothyroxine (SYNTHROID, LEVOTHROID) 50 MCG tablet Take 50 mcg by mouth every morning.    Yes Historical Jameon Deller, MD  Temazepam  (RESTORIL PO) Take 1 tablet by mouth at bedtime as needed (for sleep).    Yes Historical Akiva Josey, MD  topiramate (TOPAMAX) 25 MG capsule Take 75 mg by mouth at bedtime.    Yes Historical Kazzandra Desaulniers, MD  verapamil (CALAN) 120 MG tablet Take 120 mg by mouth 2 (two) times daily.    Yes Historical Gweneth Fredlund, MD    Allergies:  Allergies  Allergen Reactions  . Ciprofloxacin     Serum sickness    REQUIRING HOSPITALIZATION  . Flagyl [Metronidazole Hcl]     Serum sickness/   REQUIRING HOSPITALIZATION  . Scopolamine     Pt states she becomes very confused and has vertigo with scopolamine patch  . Azactam [Aztreonam] Hives  . Clindamycin/Lincomycin     hives  . Penicillins Hives and Other (See Comments)    Thrush  . Moviprep [Peg-Kcl-Nacl-Nasulf-Na Asc-C] Other (See Comments)    Pt's abdomen swells with the prep and she never "processes" the solution    History   Social History  . Marital Status: Married    Spouse Name: N/A    Number of Children: 3  . Years of Education: N/A   Occupational History  . retired    Social History Main Topics  . Smoking status: Former Smoker    Types: Cigarettes    Quit date: 04/05/2000  . Smokeless tobacco: Never Used     Comment: quit smoking 10 yrs ago  . Alcohol Use: 0.0 oz/week     Comment: weekends and occasional cocktail  . Drug Use: No  . Sexual Activity: No   Other Topics Concern  . Not on file   Social History Narrative  . No narrative on file     Family History  Problem Relation Age of Onset  . Colon cancer Mother   . Heart failure Mother   . Cancer Mother     colon  . Colon cancer Sister   . Cancer Sister     colon  . Heart disease Father     heart attack following surgery  . Esophageal cancer Neg Hx   . Rectal cancer Neg Hx   . Stomach cancer Neg Hx     Review of Systems: General: negative for chills, fever, night sweats or weight changes.  ENT: negative for rhinorrhea or epistaxis Cardiovascular: See history of  present illness Dermatological: negative for rash Respiratory: negative for cough or wheezing GI: negative for nausea, vomiting, diarrhea, bright red blood per rectum, melena, or hematemesis GU: no hematuria, urgency, or frequency Neurologic: negative for visual changes, syncope, or dizziness. Positive for headache Heme: no easy bruising or bleeding Endo: negative for excessive thirst, thyroid disorder, or flushing Musculoskeletal: negative for joint pain or swelling, negative for myalgias All other systems reviewed and are otherwise negative except as noted above.  Physical Exam: Blood pressure 178/93, pulse 75, temperature 97.9 F (36.6 C), temperature source Oral, resp. rate 15, weight 68.947 kg (152 lb), SpO2  100.00%. General: Well developed, well nourished, alert and oriented, in no acute distress. HEENT: Normocephalic, atraumatic, sclera non-icteric, no xanthomas, nares are without discharge.  Neck: Supple. Carotids 2+ without bruits. JVP normal Lungs: Clear bilaterally to auscultation without wheezes, rales, or rhonchi. Breathing is unlabored. Chest: The chest wall is tender to palpation, especially over the right substernal region. Heart: RRR with normal S1 and S2. No murmurs, rubs, or gallops appreciated. Abdomen: Soft, non-tender, non-distended with normoactive bowel sounds. No hepatomegaly. No rebound/guarding. No obvious abdominal masses. Back: No CVA tenderness Msk:  Strength and tone appear normal for age. Extremities: No clubbing, cyanosis, or edema.  Distal pedal pulses are 2+ and equal bilaterally. Neuro: CNII-XII intact, moves all extremities spontaneously. Psych:  Responds to questions appropriately with a normal affect.   Labs:   Lab Results  Component Value Date   WBC 4.6 10/11/2013   HGB 13.4 10/11/2013   HCT 38.8 10/11/2013   MCV 90.4 10/11/2013   PLT 276 10/11/2013    Recent Labs Lab 10/11/13 1443  NA 135*  K 3.5*  CL 99  CO2 19  BUN 17  CREATININE 1.08    CALCIUM 9.4  GLUCOSE 89   No results found for this basename: CKTOTAL, CKMB, TROPONINI,  in the last 72 hours No results found for this basename: CHOL, HDL, LDLCALC, TRIG   No results found for this basename: DDIMER    Radiology/Studies:  Dg Chest 2 View  10/11/2013   CLINICAL DATA:  Shortness of breath and chest pain.  EXAM: CHEST  2 VIEW  COMPARISON:  PA and lateral chest 05/24/2012.  FINDINGS: Heart size and mediastinal contours are within normal limits. Both lungs are clear. Visualized skeletal structures are unremarkable.  IMPRESSION: Negative exam.   Electronically Signed   By: Inge Rise M.D.   On: 10/11/2013 16:26     EKG: Normal sinus rhythm with nonspecific ST changes, cannot exclude inferolateral ischemia, borderline criteria for LVH  ASSESSMENT AND PLAN:  1. Chest pain with typical and atypical features. Initial troponin is negative. EKG is abnormal, but nondiagnostic. I have reviewed potential diagnostic options with the patient. Favor noninvasive evaluation with an exercise stress echocardiogram. This will be scheduled for tomorrow. If recurrence of chest pain or positive enzymes, may need to consider cardiac catheterization as first test. Otherwise we'll plan on a stress echocardiogram and hospital discharge as long as there are no significant abnormalities seen. Will treat with metoprolol and aspirin. Will add a low-dose statin and check lipids. Depending on findings of her studies, will determine need for long-term statin.  2. Hypertension. Blood pressure elevated at present. However, she reports very good blood pressure control at home. Will add a beta blocker temporarily in the setting of her chest discomfort until a full ischemic evaluation is completed.  Disposition: Will cycle enzymes, plan stress echocardiogram tomorrow, and to determine further evaluation depending the results of her lab work and stress echo. Also will check a d-dimer considering her recent travel.    Signed, Sherren Mocha MD 10/11/2013, 6:16 PM

## 2013-10-11 NOTE — ED Notes (Signed)
Pt states she does not wish to try nitro SL for chest pain at this time, c/o headache currently and states the chest pain is less than her headache and she does not want to increase it. MD aware, another dose of morphine ordered for pain.

## 2013-10-11 NOTE — ED Notes (Signed)
Cardiology at bedside.

## 2013-10-11 NOTE — ED Notes (Signed)
Pt taken to bath via wheelchair; pt urinated and provided an urine specimen

## 2013-10-12 ENCOUNTER — Encounter (HOSPITAL_COMMUNITY)
Admission: EM | Disposition: A | Discharge status: Home / Self Care | Payer: Medicare Other | Source: Home / Self Care | Attending: Cardiovascular Disease

## 2013-10-12 DIAGNOSIS — R079 Chest pain, unspecified: Secondary | ICD-10-CM

## 2013-10-12 DIAGNOSIS — I10 Essential (primary) hypertension: Secondary | ICD-10-CM | POA: Diagnosis not present

## 2013-10-12 DIAGNOSIS — K219 Gastro-esophageal reflux disease without esophagitis: Secondary | ICD-10-CM

## 2013-10-12 DIAGNOSIS — E538 Deficiency of other specified B group vitamins: Secondary | ICD-10-CM | POA: Diagnosis not present

## 2013-10-12 DIAGNOSIS — I200 Unstable angina: Secondary | ICD-10-CM | POA: Diagnosis not present

## 2013-10-12 HISTORY — PX: LEFT HEART CATHETERIZATION WITH CORONARY ANGIOGRAM: SHX5451

## 2013-10-12 LAB — LIPID PANEL
Cholesterol: 169 mg/dL (ref 0–200)
HDL: 81 mg/dL (ref >39)
LDL Cholesterol: 74 mg/dL (ref 0–99)
Total CHOL/HDL Ratio: 2.1 RATIO
Triglycerides: 70 mg/dL (ref <150)
VLDL: 14 mg/dL (ref 0–40)

## 2013-10-12 LAB — BASIC METABOLIC PANEL
Anion gap: 16 — ABNORMAL HIGH (ref 5–15)
Anion gap: 16 — ABNORMAL HIGH (ref 5–15)
BUN: 20 mg/dL (ref 6–23)
BUN: 19 mg/dL (ref 6–23)
CO2: 21 mEq/L (ref 19–32)
CO2: 22 mEq/L (ref 19–32)
Calcium: 9.2 mg/dL (ref 8.4–10.5)
Calcium: 9.4 mg/dL (ref 8.4–10.5)
Chloride: 104 mEq/L (ref 96–112)
Chloride: 104 mEq/L (ref 96–112)
Creatinine, Ser: 1.14 mg/dL — ABNORMAL HIGH (ref 0.50–1.10)
Creatinine, Ser: 1.1 mg/dL (ref 0.50–1.10)
GFR calc Af Amer: 55 mL/min — ABNORMAL LOW (ref >90)
GFR calc Af Amer: 57 mL/min — ABNORMAL LOW (ref >90)
GFR calc non Af Amer: 47 mL/min — ABNORMAL LOW (ref >90)
GFR calc non Af Amer: 49 mL/min — ABNORMAL LOW (ref >90)
Glucose, Bld: 83 mg/dL (ref 70–99)
Glucose, Bld: 78 mg/dL (ref 70–99)
Potassium: 3.8 mEq/L (ref 3.7–5.3)
Potassium: 4.1 mEq/L (ref 3.7–5.3)
Sodium: 141 mEq/L (ref 137–147)
Sodium: 142 mEq/L (ref 137–147)

## 2013-10-12 LAB — CBC
HCT: 36.2 % (ref 36.0–46.0)
HCT: 39.1 % (ref 36.0–46.0)
HCT: 36.1 % (ref 36.0–46.0)
Hemoglobin: 12.3 g/dL (ref 12.0–15.0)
Hemoglobin: 13 g/dL (ref 12.0–15.0)
Hemoglobin: 12.1 g/dL (ref 12.0–15.0)
MCH: 30.9 pg (ref 26.0–34.0)
MCH: 30.3 pg (ref 26.0–34.0)
MCH: 30.7 pg (ref 26.0–34.0)
MCHC: 34 g/dL (ref 30.0–36.0)
MCHC: 33.2 g/dL (ref 30.0–36.0)
MCHC: 33.5 g/dL (ref 30.0–36.0)
MCV: 91 fL (ref 78.0–100.0)
MCV: 91.1 fL (ref 78.0–100.0)
MCV: 91.6 fL (ref 78.0–100.0)
Platelets: 231 10*3/uL (ref 150–400)
Platelets: 256 10*3/uL (ref 150–400)
Platelets: 209 10*3/uL (ref 150–400)
RBC: 3.98 MIL/uL (ref 3.87–5.11)
RBC: 4.29 MIL/uL (ref 3.87–5.11)
RBC: 3.94 MIL/uL (ref 3.87–5.11)
RDW: 12.7 % (ref 11.5–15.5)
RDW: 12.7 % (ref 11.5–15.5)
RDW: 12.7 % (ref 11.5–15.5)
WBC: 4.7 10*3/uL (ref 4.0–10.5)
WBC: 3.1 10*3/uL — ABNORMAL LOW (ref 4.0–10.5)
WBC: 3.9 10*3/uL — ABNORMAL LOW (ref 4.0–10.5)

## 2013-10-12 LAB — PROTIME-INR
INR: 0.92 (ref 0.00–1.49)
Prothrombin Time: 12.4 seconds (ref 11.6–15.2)

## 2013-10-12 LAB — CREATININE, SERUM
Creatinine, Ser: 1.1 mg/dL (ref 0.50–1.10)
GFR calc Af Amer: 57 mL/min — ABNORMAL LOW (ref >90)
GFR calc non Af Amer: 49 mL/min — ABNORMAL LOW (ref >90)

## 2013-10-12 LAB — HEMOGLOBIN A1C
Hgb A1c MFr Bld: 4.8 % (ref <5.7)
Mean Plasma Glucose: 91 mg/dL (ref <117)

## 2013-10-12 LAB — PLATELET INHIBITION P2Y12: Platelet Function  P2Y12: 323 [PRU] (ref 194–418)

## 2013-10-12 LAB — TROPONIN I
Troponin I: <0.3 ng/mL (ref <0.30)
Troponin I: <0.3 ng/mL (ref <0.30)

## 2013-10-12 SURGERY — LEFT HEART CATHETERIZATION WITH CORONARY ANGIOGRAM
Anesthesia: LOCAL

## 2013-10-12 MED ORDER — ASPIRIN 81 MG PO CHEW
81.0000 mg | CHEWABLE_TABLET | ORAL | Status: AC
  Administered 2013-10-12: 81 mg via ORAL
  Filled 2013-10-12: qty 1

## 2013-10-12 MED ORDER — SODIUM CHLORIDE 0.9 % IV SOLN
INTRAVENOUS | Status: DC
  Administered 2013-10-12: 12:00:00 via INTRAVENOUS

## 2013-10-12 MED ORDER — FENTANYL CITRATE 0.05 MG/ML IJ SOLN
INTRAMUSCULAR | Status: AC
  Filled 2013-10-12: qty 2

## 2013-10-12 MED ORDER — SODIUM CHLORIDE 0.9 % IJ SOLN: 3.0000 mL | INTRAMUSCULAR | Status: DC | PRN

## 2013-10-12 MED ORDER — SODIUM CHLORIDE 0.9 % IJ SOLN: 3.0000 mL | Freq: Two times a day (BID) | INTRAMUSCULAR | Status: DC

## 2013-10-12 MED ORDER — LIDOCAINE HCL (PF) 1 % IJ SOLN
INTRAMUSCULAR | Status: AC
  Filled 2013-10-12: qty 30

## 2013-10-12 MED ORDER — MIDAZOLAM HCL 2 MG/2ML IJ SOLN
INTRAMUSCULAR | Status: AC
  Filled 2013-10-12: qty 2

## 2013-10-12 MED ORDER — NITROGLYCERIN 0.2 MG/ML ON CALL CATH LAB
INTRAVENOUS | Status: AC
  Filled 2013-10-12: qty 1

## 2013-10-12 MED ORDER — SODIUM CHLORIDE 0.9 % IV SOLN: 250.0000 mL | INTRAVENOUS | Status: DC | PRN

## 2013-10-12 MED ORDER — METOPROLOL TARTRATE 12.5 MG HALF TABLET
12.5000 mg | ORAL_TABLET | Freq: Two times a day (BID) | ORAL | Status: DC
  Administered 2013-10-12 (×2): 12.5 mg via ORAL
  Filled 2013-10-12 (×5): qty 1

## 2013-10-12 MED ORDER — HEPARIN (PORCINE) IN NACL 2-0.9 UNIT/ML-% IJ SOLN
INTRAMUSCULAR | Status: AC
Start: 2013-10-12 — End: 2013-10-12
  Filled 2013-10-12: qty 1000

## 2013-10-12 MED ORDER — HEPARIN SODIUM (PORCINE) 1000 UNIT/ML IJ SOLN
INTRAMUSCULAR | Status: AC
  Filled 2013-10-12: qty 1

## 2013-10-12 MED ORDER — SODIUM CHLORIDE 0.9 % IV SOLN: INTRAVENOUS | Status: AC

## 2013-10-12 MED ORDER — VERAPAMIL HCL 2.5 MG/ML IV SOLN
INTRAVENOUS | Status: AC
  Filled 2013-10-12: qty 2

## 2013-10-12 NOTE — Interval H&P Note (Signed)
Cath Lab Visit (complete for each Cath Lab visit)  Clinical Evaluation Leading to the Procedure:   ACS: Yes.    Non-ACS:    Anginal Classification: CCS IV  Anti-ischemic medical therapy: No Therapy  Non-Invasive Test Results: No non-invasive testing performed  Prior CABG: No previous CABG      History and Physical Interval Note:  10/12/2013 5:21 PM  Pamela Snow  has presented today for surgery, with the diagnosis of cp  The various methods of treatment have been discussed with the patient and family. After consideration of risks, benefits and other options for treatment, the patient has consented to  Procedure(s): LEFT HEART CATHETERIZATION WITH CORONARY ANGIOGRAM (N/A) as a surgical intervention .  The patient's history has been reviewed, patient examined, no change in status, stable for surgery.  I have reviewed the patient's chart and labs.  Questions were answered to the patient's satisfaction.     Sinclair Grooms

## 2013-10-12 NOTE — Progress Notes (Addendum)
SUBJECTIVE:  Had recurrent severe CP last PM  OBJECTIVE:   Vitals:   Filed Vitals:   10/11/13 1900 10/11/13 1952 10/11/13 2341 10/12/13 0519  BP: 138/93 146/79 131/67 105/61  Pulse: 74 58 60 58  Temp:  98.4 F (36.9 C) 99.1 F (37.3 C) 98.2 F (36.8 C)  TempSrc:  Oral Oral Oral  Resp: 12 16  18   Height:  5\' 8"  (1.727 m)    Weight:  157 lb 1.6 oz (71.26 kg)    SpO2: 100% 97% 96% 99%   I&O's:  No intake or output data in the 24 hours ending 10/12/13 0804 TELEMETRY: Reviewed telemetry pt in NSR:     PHYSICAL EXAM General: Well developed, well nourished, in no acute distress Head: Eyes PERRLA, No xanthomas.   Normal cephalic and atramatic  Lungs:   Clear bilaterally to auscultation and percussion. Heart:   HRRR S1 S2 Pulses are 2+ & equal. Abdomen: Bowel sounds are positive, abdomen soft and non-tender without masses  Extremities:   No clubbing, cyanosis or edema.  DP +1 Neuro: Alert and oriented X 3. Psych:  Good affect, responds appropriately   LABS: Basic Metabolic Panel:  Recent Labs  10/11/13 1443 10/11/13 1859 10/11/13 2235 10/12/13 0500  NA 135*  --   --  141  K 3.5*  --   --  3.8  CL 99  --   --  104  CO2 19  --   --  21  GLUCOSE 89  --   --  83  BUN 17  --   --  20  CREATININE 1.08  --  1.12* 1.14*  CALCIUM 9.4  --   --  9.2  MG  --  2.3  --   --    Liver Function Tests: No results found for this basename: AST, ALT, ALKPHOS, BILITOT, PROT, ALBUMIN,  in the last 72 hours No results found for this basename: LIPASE, AMYLASE,  in the last 72 hours CBC:  Recent Labs  10/11/13 2235 10/12/13 0500  WBC 5.8 4.7  HGB 12.6 12.3  HCT 37.5 36.2  MCV 91.0 91.0  PLT 262 231   Cardiac Enzymes:  Recent Labs  10/11/13 1859 10/12/13 0143 10/12/13 0500  TROPONINI <0.30 <0.30 <0.30   BNP: No components found with this basename: POCBNP,  D-Dimer:  Recent Labs  10/11/13 1859  DDIMER <0.27   Hemoglobin A1C:  Recent Labs  10/11/13 1859  HGBA1C  4.8   Fasting Lipid Panel:  Recent Labs  10/12/13 0500  CHOL 169  HDL 81  LDLCALC 74  TRIG 70  CHOLHDL 2.1   Thyroid Function Tests:  Recent Labs  10/11/13 1859  TSH 1.930   Anemia Panel: No results found for this basename: VITAMINB12, FOLATE, FERRITIN, TIBC, IRON, RETICCTPCT,  in the last 72 hours Coag Panel:   Lab Results  Component Value Date   INR 0.96 10/11/2013   INR 1.02 05/24/2012   INR 1.00 02/17/2011    RADIOLOGY: Dg Chest 2 View  10/11/2013   CLINICAL DATA:  Shortness of breath and chest pain.  EXAM: CHEST  2 VIEW  COMPARISON:  PA and lateral chest 05/24/2012.  FINDINGS: Heart size and mediastinal contours are within normal limits. Both lungs are clear. Visualized skeletal structures are unremarkable.  IMPRESSION: Negative exam.   Electronically Signed   By: Inge Rise M.D.   On: 10/11/2013 16:26   ASSESSMENT AND PLAN:  1. Chest pain with typical and atypical features.  Troponin is negative x 3. EKG is abnormal, but nondiagnostic.  She had recurrent severe CP last night. She does have a history of GERD and is on a PPI but says she is very careful with her diet and has not had any pain like this with her GERD in the past.  Will cancel stress echo and proceed with cath given reoccurence of pain.  Will treat with metoprolol and aspirin. Low-dose statin added and LDL already at goal so if cath is normal then would not continue on statin.  2. Hypertension. Blood pressure elevated yesterday but she reports very good blood pressure control at home on Calan.  Beta blocker added yesterday temporarily in the setting of her chest discomfort until a full ischemic evaluation is completed and Calan stopped.  If no CAD can stop BB and place back on home dose of Calan.    Sueanne Margarita, MD  10/12/2013  8:04 AM

## 2013-10-12 NOTE — H&P (View-Only) (Signed)
SUBJECTIVE:  Had recurrent severe CP last PM  OBJECTIVE:   Vitals:   Filed Vitals:   10/11/13 1900 10/11/13 1952 10/11/13 2341 10/12/13 0519  BP: 138/93 146/79 131/67 105/61  Pulse: 74 58 60 58  Temp:  98.4 F (36.9 C) 99.1 F (37.3 C) 98.2 F (36.8 C)  TempSrc:  Oral Oral Oral  Resp: 12 16  18   Height:  5\' 8"  (1.727 m)    Weight:  157 lb 1.6 oz (71.26 kg)    SpO2: 100% 97% 96% 99%   I&O's:  No intake or output data in the 24 hours ending 10/12/13 0804 TELEMETRY: Reviewed telemetry pt in NSR:     PHYSICAL EXAM General: Well developed, well nourished, in no acute distress Head: Eyes PERRLA, No xanthomas.   Normal cephalic and atramatic  Lungs:   Clear bilaterally to auscultation and percussion. Heart:   HRRR S1 S2 Pulses are 2+ & equal. Abdomen: Bowel sounds are positive, abdomen soft and non-tender without masses  Extremities:   No clubbing, cyanosis or edema.  DP +1 Neuro: Alert and oriented X 3. Psych:  Good affect, responds appropriately   LABS: Basic Metabolic Panel:  Recent Labs  10/11/13 1443 10/11/13 1859 10/11/13 2235 10/12/13 0500  NA 135*  --   --  141  K 3.5*  --   --  3.8  CL 99  --   --  104  CO2 19  --   --  21  GLUCOSE 89  --   --  83  BUN 17  --   --  20  CREATININE 1.08  --  1.12* 1.14*  CALCIUM 9.4  --   --  9.2  MG  --  2.3  --   --    Liver Function Tests: No results found for this basename: AST, ALT, ALKPHOS, BILITOT, PROT, ALBUMIN,  in the last 72 hours No results found for this basename: LIPASE, AMYLASE,  in the last 72 hours CBC:  Recent Labs  10/11/13 2235 10/12/13 0500  WBC 5.8 4.7  HGB 12.6 12.3  HCT 37.5 36.2  MCV 91.0 91.0  PLT 262 231   Cardiac Enzymes:  Recent Labs  10/11/13 1859 10/12/13 0143 10/12/13 0500  TROPONINI <0.30 <0.30 <0.30   BNP: No components found with this basename: POCBNP,  D-Dimer:  Recent Labs  10/11/13 1859  DDIMER <0.27   Hemoglobin A1C:  Recent Labs  10/11/13 1859  HGBA1C  4.8   Fasting Lipid Panel:  Recent Labs  10/12/13 0500  CHOL 169  HDL 81  LDLCALC 74  TRIG 70  CHOLHDL 2.1   Thyroid Function Tests:  Recent Labs  10/11/13 1859  TSH 1.930   Anemia Panel: No results found for this basename: VITAMINB12, FOLATE, FERRITIN, TIBC, IRON, RETICCTPCT,  in the last 72 hours Coag Panel:   Lab Results  Component Value Date   INR 0.96 10/11/2013   INR 1.02 05/24/2012   INR 1.00 02/17/2011    RADIOLOGY: Dg Chest 2 View  10/11/2013   CLINICAL DATA:  Shortness of breath and chest pain.  EXAM: CHEST  2 VIEW  COMPARISON:  PA and lateral chest 05/24/2012.  FINDINGS: Heart size and mediastinal contours are within normal limits. Both lungs are clear. Visualized skeletal structures are unremarkable.  IMPRESSION: Negative exam.   Electronically Signed   By: Inge Rise M.D.   On: 10/11/2013 16:26   ASSESSMENT AND PLAN:  1. Chest pain with typical and atypical features.  Troponin is negative x 3. EKG is abnormal, but nondiagnostic.  She had recurrent severe CP last night. She does have a history of GERD and is on a PPI but says she is very careful with her diet and has not had any pain like this with her GERD in the past.  Will cancel stress echo and proceed with cath given reoccurence of pain.  Will treat with metoprolol and aspirin. Low-dose statin added and LDL already at goal so if cath is normal then would not continue on statin.  2. Hypertension. Blood pressure elevated yesterday but she reports very good blood pressure control at home on Calan.  Beta blocker added yesterday temporarily in the setting of her chest discomfort until a full ischemic evaluation is completed and Calan stopped.  If no CAD can stop BB and place back on home dose of Calan.    Pamela Margarita, MD  10/12/2013  8:04 AM

## 2013-10-12 NOTE — Progress Notes (Signed)
Earlier in the shift around 2330, notified Dr. Glendon Axe of patient continuing to have chest pain and that patient states the morphine is not working and is requesting for something stronger to relieve the pain. Patient refused nitroglycerin SL tab for she has a history of migraines and did not want to aggravate that due to the side effects of the nitro tab. Orders given by doctor, that her only other option is tylenol. Patient states that tylenol does not work. After suggesting her PRN xanax to help her relax, patient agreed to take the tylenol with the xanax. Asked Dr. Glendon Axe if okay to give tylenol and xanax after just receiving IV morphine 15-36min prior to notifying him and the doctor approved. Currently patient has been resting soundly through the night after xanax and tylenol were given. Will continue to monitor patient for chest pain to end of shift.

## 2013-10-12 NOTE — Progress Notes (Signed)
This morning patient states the xanax and tylenol worked really well and patient thinks she must have had a lot of anxiety but it also helped with the pain as well. Patient feels that she may need another dose of xanax later in the day before her stress test. Will notify oncoming nurse to notify doctor when making their rounds. At this time patient complains of no pain or discomfort. Will continue to monitor patient to end of shift.

## 2013-10-12 NOTE — Plan of Care (Signed)
Problem: Phase II Progression Outcomes Goal: Stress Test if indicated Outcome: Progressing Patient is having stress test some time today.

## 2013-10-12 NOTE — CV Procedure (Signed)
     Left Heart Catheterization with Coronary Angiography Report  Pamela Snow  73 y.o.  female 1941/01/07  Procedure Date: 10/12/2013 Referring Physician: Fransico Him, MD Primary Cardiologist: Sherren Mocha, MD Primary Physician: Shon Baton, M.D.  INDICATIONS: Prolonged chest pain concerning for angina pectoris. Negative markers and and EKGs.  PROCEDURE: 1. Left heart catheterization; 2. Coronary angiography; 3. Left ventriculography  CONSENT:  The risks, benefits, and details of the procedure were explained in detail to the patient. Risks including death, stroke, heart attack, kidney injury, allergy, limb ischemia, bleeding and radiation injury were discussed.  The patient verbalized understanding and wanted to proceed.  Informed written consent was obtained.  PROCEDURE TECHNIQUE:  After Xylocaine anesthesia a 5 French Slender sheath was placed in the right radial artery with an angiocath and the modified Seldinger technique.  Coronary angiography was done using a 5 F JR 4 and JL 3.5 cm diagnosed catheter.  Left ventriculography was done using the JR 4 catheter and hand injection.   Images were reviewed and the case was terminated.   CONTRAST:  Total of 60 cc.  COMPLICATIONS:  None   HEMODYNAMICS:  Aortic pressure 171/68 mmHg; LV pressure 173/7 mmHg; LVEDP 11 mm mercury  ANGIOGRAPHIC DATA:   The left main coronary artery is normal.  The left anterior descending artery is tortuous but normal.  The left circumflex artery is normal.  The right coronary artery is normal and dominant.  LEFT VENTRICULOGRAM:  Left ventricular angiogram was done in the 30 RAO projection and revealed normal cavity size and contractility with an estimated ejection fraction of 65%.   IMPRESSIONS:  1. Normal coronary arteries. 2. Normal left ventricular systolic function and hemodynamics.   RECOMMENDATION:  No further ischemic evaluation seems prudent. Other considerations would include  aortic, inflammatory, and esophageal etiologies.

## 2013-10-12 NOTE — Progress Notes (Signed)
Utilization review completed.  

## 2013-10-12 NOTE — Progress Notes (Signed)
Pt complained of chest pain.  She refused Sublingual Nitro and O2.  She stated she wanted to she if tylenol would make it go away.  Tylenol given.  Will continue to monitor.

## 2013-10-13 ENCOUNTER — Encounter (HOSPITAL_COMMUNITY): Payer: Self-pay | Admitting: Physician Assistant

## 2013-10-13 DIAGNOSIS — I10 Essential (primary) hypertension: Secondary | ICD-10-CM | POA: Diagnosis not present

## 2013-10-13 DIAGNOSIS — K219 Gastro-esophageal reflux disease without esophagitis: Secondary | ICD-10-CM | POA: Diagnosis not present

## 2013-10-13 DIAGNOSIS — R079 Chest pain, unspecified: Secondary | ICD-10-CM | POA: Diagnosis not present

## 2013-10-13 MED ORDER — ESOMEPRAZOLE MAGNESIUM 40 MG PO CPDR: DELAYED_RELEASE_CAPSULE | ORAL | Status: DC

## 2013-10-13 NOTE — Discharge Summary (Signed)
Discharge Summary   Patient ID: Pamela Snow, MRN: [id number], DOB/AGE: 11-17-1940 73 y.o.  Admit date: 10/11/2013 Discharge date: 10/13/2013   Primary Care Physician:  Precious Reel   Primary Cardiologist:  Dr. Sherren Mocha    Reason for Admission:  Chest Pain concerning for unstable angina   Primary Discharge Diagnoses:  Principal Problem:   Unstable Angina Active Problems:   GERD   HTN (hypertension)     Wt Readings from Last 3 Encounters:  10/13/13 162 lb 7.7 oz (73.7 kg)  10/13/13 162 lb 7.7 oz (73.7 kg)  06/20/12 152 lb 9.6 oz (69.219 kg)    Secondary Discharge Diagnoses:   Past Medical History  Diagnosis Date  . Diverticulosis     hx of diverticulitis  . Arthritis   . Hypertension   . Hyperparathyroidism   . Adenomatous colon polyp   . B12 deficiency   . Chronic constipation   . Generalized headaches     states never had until trouble with parathyroidism  . GERD (gastroesophageal reflux disease)   . Kidney stones   . Hiatal hernia   . Symptomatic cholelithiasis 05/26/2012    s/p cholecystectomy  . Hx of cardiac catheterization     a. LHC (10/2013):  normal coronary arteries, EF 65%       Allergies:    Allergies  Allergen Reactions  . Ciprofloxacin     Serum sickness    REQUIRING HOSPITALIZATION  . Flagyl [Metronidazole Hcl]     Serum sickness/   REQUIRING HOSPITALIZATION  . Scopolamine     Pt states she becomes very confused and has vertigo with scopolamine patch  . Azactam [Aztreonam] Hives  . Clindamycin/Lincomycin     hives  . Penicillins Hives and Other (See Comments)    Thrush  . Moviprep [Peg-Kcl-Nacl-Nasulf-Na Asc-C] Other (See Comments)    Pt's abdomen swells with the prep and she never "processes" the solution      Procedures Performed This Admission:    Cardiac Catherization (10/12/13):  ANGIOGRAPHIC DATA: The left main coronary artery is normal.  The left anterior descending artery is tortuous but normal.  The left  circumflex artery is normal.  The right coronary artery is normal and dominant.   LEFT VENTRICULOGRAM: Left ventricular angiogram was done in the 30 RAO projection and revealed normal cavity size and contractility with an estimated ejection fraction of 65%.   IMPRESSIONS: 1. Normal coronary arteries.  2. Normal left ventricular systolic function and hemodynamics.   RECOMMENDATION: No further ischemic evaluation seems prudent. Other considerations would include aortic, inflammatory, and esophageal etiologies.   Hospital Course:  Pamela Snow is a 73 y.o. female with a hx of HTN, GERD.  She presented to the ED at [facility] with complaints of substernal chest pain that started while driving home from the beach.  She had not felt well prior to this with HA, generalized fatigue, weakness.  She had non-specific ST changes on her ECG and inferolateral ischemia could not be ruled out (borderline criteria for LVH).  Cardiac enzymes remained normal.  Plan was for inpatient ETT-Echo.  However, she had recurrent chest pain and there was concern for unstable angina.  DDimer was negative.  Cardiac catheterization was done yesterday and demonstrated normal coronary arteries.  No further cardiac workup needed.  She was seen by Dr. Kate Sable this AM and felt to be stable and ready for d/c to home.  She wants to travel to [place] next week and she has  been cleared to do so.  She has a hx of GERD.  Given her unexplained chest pain, she has been asked to increase her Nexium to twice daily for one week and follow up with her PCP.  She may need follow up with GI for further evaluation.  She can follow up with Dr. Sherren Mocha or the PA in 2 weeks.    Discharge Vitals:   Blood pressure 110/58, pulse 57, temperature 97.4 F (36.3 C), temperature source Oral, resp. rate 14, height 5\' 8"  (1.727 m), weight 162 lb 7.7 oz (73.7 kg), SpO2 98.00%.   Labs:   Recent Labs  10/12/13 0500 10/12/13 1240  10/12/13 2131  WBC 4.7 3.1* 3.9*  HGB 12.3 13.0 12.1  HCT 36.2 39.1 36.1  MCV 91.0 91.1 91.6  PLT 231 256 209     Recent Labs  10/11/13 1443  10/12/13 0500 10/12/13 1240 10/12/13 2131  NA 135*  --  141 142  --   K 3.5*  --  3.8 4.1  --   CL 99  --  104 104  --   CO2 19  --  21 22  --   BUN 17  --  20 19  --   CREATININE 1.08  < > 1.14* 1.10 1.10  CALCIUM 9.4  --  9.2 9.4  --   < > = values in this interval not displayed.   Recent Labs  10/11/13 1859 10/12/13 0143 10/12/13 0500  TROPONINI <0.30 <0.30 <0.30    Lab Results  Component Value Date   CHOL 169 10/12/2013   HDL 81 10/12/2013   LDLCALC 74 10/12/2013   TRIG 70 10/12/2013    Lab Results  Component Value Date   DDIMER <0.27 10/11/2013    Lab Results  Component Value Date   TSH 1.930 10/11/2013     Recent Labs  10/11/13 1859 10/12/13 1240  INR 0.96 0.92     Diagnostic Procedures and Studies:  Dg Chest 2 View  10/11/2013      IMPRESSION: Negative exam.   Electronically Signed   By: Inge Rise M.D.   On: 10/11/2013 16:26     Disposition:   Pt is being discharged home today in good condition.  Follow-up Plans & Appointments      Follow-up Information   Follow up with Sherren Mocha, MD In 2 weeks. (the office will call to arrange follow up with Dr. Burt Knack or the PA)    Specialty:  Cardiology   Contact information:   [address] [place] [address] [phone]       Call Precious Reel, MD.   Specialty:  Internal Medicine   Contact information:   [place] [address] [phone]       Discharge Medications    Medication List         ALPRAZolam 0.5 MG tablet  Commonly known as:  XANAX  Take 0.5 mg by mouth daily as needed for anxiety.     aspirin EC 325 MG tablet  Take 325 mg by mouth daily.     buPROPion 150 MG 12 hr tablet  Commonly known as:  WELLBUTRIN SR  Take 150 mg by mouth every morning.     cyanocobalamin 1000 MCG/ML  injection  Commonly known as:  (VITAMIN B-12)  Inject 1,000 mcg into the muscle every 30 (thirty) days. Inject 1 ml into a muscle once weekly for 3 weeks, then inject 1 ml into muscle  monthly for a year.     esomeprazole 40 MG capsule  Commonly known as:  NEXIUM  Take one capsule twice daily (30 minutes before eating) for one week.  Then, resume one capsule daily.     HYDROcodone-acetaminophen 5-325 MG per tablet  Commonly known as:  NORCO/VICODIN  Take 1 tablet by mouth every 6 (six) hours as needed for moderate pain.     levothyroxine 50 MCG tablet  Commonly known as:  SYNTHROID, LEVOTHROID  Take 50 mcg by mouth every morning.     LUNESTA PO  Take by mouth.     eszopiclone 3 MG Tabs  Generic drug:  Eszopiclone  Take 3 mg by mouth at bedtime as needed (for sleep). Take immediately before bedtime     RESTORIL PO  Take 1 tablet by mouth at bedtime as needed (for sleep).     topiramate 25 MG capsule  Commonly known as:  TOPAMAX  Take 75 mg by mouth at bedtime.     verapamil 120 MG tablet  Commonly known as:  CALAN  Take 120 mg by mouth 2 (two) times daily.         Outstanding Labs/Studies  1. None   Duration of Discharge Encounter: Greater than 30 minutes including physician and PA time.  Signed, Richardson Dopp, PA-C   10/13/2013 9:42 AM

## 2013-10-13 NOTE — Discharge Summary (Signed)
See my note

## 2013-10-13 NOTE — Progress Notes (Signed)
The patient was seen and examined, and I agree with the assessment and plan as documented above. Pt is doing well this morning, denying chest pain and shortness of breath. Eager to go home, and is planning on traveling to [place] ([place]) next week to see new grandson. Normal coronaries by cath yesterday. Has a h/o GERD for which she takes Nexium. Will increase dose, as unclear etiology of chest pain. D-dimer was normal, as was chest xray. Consider outpatient GI workup (EGD). Will d/c to home today.

## 2013-10-13 NOTE — Progress Notes (Signed)
  Progress Note   Subjective:  Denies CP or dyspnea   Objective:  Filed Vitals:   10/12/13 1900 10/12/13 2300 10/13/13 0414 10/13/13 0750  BP: 111/55 108/53 138/73 110/58  Pulse: 59 57 63 57  Temp: 97.2 F (36.2 C) 98.1 F (36.7 C) 97 F (36.1 C) 97.4 F (36.3 C)  TempSrc: Oral Oral Oral Oral  Resp: 16 15 16 14   Height:      Weight:   162 lb 7.7 oz (73.7 kg)   SpO2: 98% 95% 97% 98%    Intake/Output from previous day:  Intake/Output Summary (Last 24 hours) at 10/13/13 0838 Last data filed at 10/13/13 0412  Gross per 24 hour  Intake    735 ml  Output    100 ml  Net    635 ml    PHYSICAL EXAM: No acute distress Neck: no JVD Cardiac:  normal S1, S2; RRR; no murmur Lungs:  clear to auscultation bilaterally, no wheezing, rhonchi or rales Abd: soft, nontender, no hepatomegaly Ext: no edemaright wrist without hematoma or mass (dressing intact); mild amount of ecchymosis noted Skin: warm and dry Neuro:  CNs 2-12 intact, no focal abnormalities noted   Lab Results:  Basic Metabolic Panel:  Recent Labs  10/11/13 1443 10/11/13 1859  10/12/13 0500 10/12/13 1240 10/12/13 2131  NA 135*  --   --  141 142  --   K 3.5*  --   --  3.8 4.1  --   CL 99  --   --  104 104  --   CO2 19  --   --  21 22  --   GLUCOSE 89  --   --  83 78  --   BUN 17  --   --  20 19  --   CREATININE 1.08  --   < > 1.14* 1.10 1.10  CALCIUM 9.4  --   --  9.2 9.4  --   MG  --  2.3  --   --   --   --   < > = values in this interval not displayed.  CBC:  Recent Labs  10/12/13 1240 10/12/13 2131  WBC 3.1* 3.9*  HGB 13.0 12.1  HCT 39.1 36.1  MCV 91.1 91.6  PLT 256 209    Cardiac Enzymes:  Recent Labs  10/11/13 1859 10/12/13 0143 10/12/13 0500  TROPONINI <0.30 <0.30 <0.30    10/11/2013: D-Dimer <0.27 ug/mL-FEU (Ref range: 0.00 - 0.48 ug/mL-FEU)   Assessment:   Principal Problem:   Unstable Angina Active Problems:   GERD   HTN (hypertension)     Plan:  Cardiac  catheterization yesterday demonstrated normal coronary arteries.  Resume home medications.  Etiology of chest pain not clear.  Increase PPI to bid for 1 week and follow up with PCP/GI.  Patient wants to go to [facility] Monday to visit newborn grandchild.  She is ok to travel.  F/u with PA/NP for Dr. Sherren Mocha in 2 weeks.  MD to see prior to d/c.  Richardson Dopp, PA-C   10/13/2013 8:38 AM  Pager # [phone]

## 2013-10-13 NOTE — Progress Notes (Signed)
TR BAND REMOVAL  LOCATION:    right radial  DEFLATED PER PROTOCOL:    Yes.    TIME BAND OFF / DRESSING APPLIED:    22:00   SITE UPON ARRIVAL:    Level 0  SITE AFTER BAND REMOVAL:    Level 1  REVERSE ALLEN'S TEST:       CIRCULATION SENSATION AND MOVEMENT:    Within Normal Limits   Yes.    COMMENTS:   Pt tolerated removal of TR band without distress.  Will continue to monitor pt

## 2013-10-18 NOTE — ED Provider Notes (Signed)
I saw and evaluated the patient, reviewed the resident's note and I agree with the findings and plan.   .Face to face Exam:  General:  Awake HEENT:  Atraumatic Resp:  Normal effort Abd:  Nondistended Neuro:No focal weakness   EKG  reviewed and discussed with resident  Dot Lanes, MD 10/18/13 1216

## 2013-11-01 ENCOUNTER — Encounter: Payer: Medicare Other | Admitting: Cardiovascular Disease

## 2013-11-26 ENCOUNTER — Ambulatory Visit (INDEPENDENT_AMBULATORY_CARE_PROVIDER_SITE_OTHER): Payer: Medicare Other | Admitting: Gastroenterology

## 2013-11-26 DIAGNOSIS — E538 Deficiency of other specified B group vitamins: Secondary | ICD-10-CM | POA: Diagnosis not present

## 2013-11-26 MED ORDER — CYANOCOBALAMIN 1000 MCG/ML IJ SOLN
1000.0000 ug | Freq: Once | INTRAMUSCULAR | Status: AC
  Administered 2013-11-26: 1000 ug via INTRAMUSCULAR

## 2013-11-26 MED ORDER — CYANOCOBALAMIN 1000 MCG/ML IJ SOLN
1000.0000 ug | Freq: Once | INTRAMUSCULAR | Status: AC
  Administered 2013-12-27: 1000 ug via INTRAMUSCULAR

## 2013-12-27 ENCOUNTER — Ambulatory Visit (INDEPENDENT_AMBULATORY_CARE_PROVIDER_SITE_OTHER): Payer: Medicare Other | Admitting: Gastroenterology

## 2013-12-27 DIAGNOSIS — E538 Deficiency of other specified B group vitamins: Secondary | ICD-10-CM

## 2014-01-02 ENCOUNTER — Encounter: Payer: Self-pay | Admitting: Gastroenterology

## 2014-01-28 ENCOUNTER — Ambulatory Visit (INDEPENDENT_AMBULATORY_CARE_PROVIDER_SITE_OTHER): Payer: Medicare Other | Admitting: Gastroenterology

## 2014-01-28 ENCOUNTER — Telehealth: Payer: Self-pay

## 2014-01-28 DIAGNOSIS — E538 Deficiency of other specified B group vitamins: Secondary | ICD-10-CM

## 2014-01-28 MED ORDER — CYANOCOBALAMIN 1000 MCG/ML IJ SOLN
1000.0000 ug | Freq: Once | INTRAMUSCULAR | Status: AC
  Administered 2014-01-28: 1000 ug via INTRAMUSCULAR

## 2014-01-28 MED ORDER — CYANOCOBALAMIN 1000 MCG/ML IJ SOLN: 1000.0000 ug | INTRAMUSCULAR | Status: DC

## 2014-01-28 NOTE — Telephone Encounter (Signed)
Dr Deatra Ina and Pyrtle the this patient is a former Dr Sharlett Iles patient and has been getting her B12 injections under Dr Deatra Ina because she was seen by Dr Deatra Ina as Doc of the Day in 2012 for Dr Sharlett Iles.  She prefers to see Dr Hilarie Fredrickson, is this ok with both of you?

## 2014-01-28 NOTE — Telephone Encounter (Signed)
No she just wants to get established.

## 2014-01-28 NOTE — Telephone Encounter (Signed)
Okay with me Is she to be seen again for a new issue or needing follow-up?

## 2014-01-28 NOTE — Telephone Encounter (Signed)
Pt aware to call when she is ready to schedule

## 2014-01-28 NOTE — Telephone Encounter (Signed)
Ok

## 2014-03-04 ENCOUNTER — Ambulatory Visit (INDEPENDENT_AMBULATORY_CARE_PROVIDER_SITE_OTHER): Payer: Medicare Other | Admitting: Internal Medicine

## 2014-03-04 DIAGNOSIS — E538 Deficiency of other specified B group vitamins: Secondary | ICD-10-CM

## 2014-03-04 MED ORDER — CYANOCOBALAMIN 1000 MCG/ML IJ SOLN
1000.0000 ug | INTRAMUSCULAR | Status: DC
  Administered 2014-03-04 – 2014-05-09 (×2): 1000 ug via INTRAMUSCULAR

## 2014-03-05 DIAGNOSIS — R300 Dysuria: Secondary | ICD-10-CM | POA: Diagnosis not present

## 2014-03-05 DIAGNOSIS — Z0000 Encounter for general adult medical examination without abnormal findings: Secondary | ICD-10-CM | POA: Diagnosis not present

## 2014-03-11 DIAGNOSIS — Z880 Allergy status to penicillin: Secondary | ICD-10-CM | POA: Diagnosis not present

## 2014-03-11 DIAGNOSIS — Z91013 Allergy to seafood: Secondary | ICD-10-CM | POA: Diagnosis not present

## 2014-03-14 ENCOUNTER — Encounter (HOSPITAL_COMMUNITY): Payer: Self-pay | Admitting: Interventional Cardiology

## 2014-04-08 ENCOUNTER — Ambulatory Visit (INDEPENDENT_AMBULATORY_CARE_PROVIDER_SITE_OTHER): Payer: Medicare Other | Admitting: Internal Medicine

## 2014-04-08 DIAGNOSIS — E538 Deficiency of other specified B group vitamins: Secondary | ICD-10-CM

## 2014-04-08 MED ORDER — CYANOCOBALAMIN 1000 MCG/ML IJ SOLN
1000.0000 ug | Freq: Once | INTRAMUSCULAR | Status: AC
  Administered 2014-04-08: 1000 ug via INTRAMUSCULAR

## 2014-05-09 ENCOUNTER — Ambulatory Visit (INDEPENDENT_AMBULATORY_CARE_PROVIDER_SITE_OTHER): Payer: Medicare Other | Admitting: Internal Medicine

## 2014-05-09 DIAGNOSIS — E538 Deficiency of other specified B group vitamins: Secondary | ICD-10-CM | POA: Diagnosis not present

## 2014-05-13 DIAGNOSIS — S6391XA Sprain of unspecified part of right wrist and hand, initial encounter: Secondary | ICD-10-CM | POA: Diagnosis not present

## 2014-05-13 DIAGNOSIS — M25441 Effusion, right hand: Secondary | ICD-10-CM | POA: Diagnosis not present

## 2014-05-13 DIAGNOSIS — M79641 Pain in right hand: Secondary | ICD-10-CM | POA: Diagnosis not present

## 2014-06-19 DIAGNOSIS — Z880 Allergy status to penicillin: Secondary | ICD-10-CM | POA: Diagnosis not present

## 2014-06-19 DIAGNOSIS — T3695XA Adverse effect of unspecified systemic antibiotic, initial encounter: Secondary | ICD-10-CM | POA: Diagnosis not present

## 2014-06-19 DIAGNOSIS — Z91018 Allergy to other foods: Secondary | ICD-10-CM | POA: Diagnosis not present

## 2014-07-30 DIAGNOSIS — E538 Deficiency of other specified B group vitamins: Secondary | ICD-10-CM | POA: Diagnosis not present

## 2014-07-30 DIAGNOSIS — Z008 Encounter for other general examination: Secondary | ICD-10-CM | POA: Diagnosis not present

## 2014-07-30 DIAGNOSIS — E039 Hypothyroidism, unspecified: Secondary | ICD-10-CM | POA: Diagnosis not present

## 2014-08-05 DIAGNOSIS — F325 Major depressive disorder, single episode, in full remission: Secondary | ICD-10-CM | POA: Diagnosis not present

## 2014-08-05 DIAGNOSIS — G4700 Insomnia, unspecified: Secondary | ICD-10-CM | POA: Diagnosis not present

## 2014-08-05 DIAGNOSIS — K219 Gastro-esophageal reflux disease without esophagitis: Secondary | ICD-10-CM | POA: Diagnosis not present

## 2014-08-05 DIAGNOSIS — R5383 Other fatigue: Secondary | ICD-10-CM | POA: Diagnosis not present

## 2014-08-05 DIAGNOSIS — Z1389 Encounter for screening for other disorder: Secondary | ICD-10-CM | POA: Diagnosis not present

## 2014-08-05 DIAGNOSIS — E538 Deficiency of other specified B group vitamins: Secondary | ICD-10-CM | POA: Diagnosis not present

## 2014-08-05 DIAGNOSIS — R030 Elevated blood-pressure reading, without diagnosis of hypertension: Secondary | ICD-10-CM | POA: Diagnosis not present

## 2014-08-05 DIAGNOSIS — Z0000 Encounter for general adult medical examination without abnormal findings: Secondary | ICD-10-CM | POA: Diagnosis not present

## 2014-08-05 DIAGNOSIS — Z6823 Body mass index (BMI) 23.0-23.9, adult: Secondary | ICD-10-CM | POA: Diagnosis not present

## 2014-08-05 DIAGNOSIS — M1990 Unspecified osteoarthritis, unspecified site: Secondary | ICD-10-CM | POA: Diagnosis not present

## 2014-08-05 DIAGNOSIS — E039 Hypothyroidism, unspecified: Secondary | ICD-10-CM | POA: Diagnosis not present

## 2014-08-14 DIAGNOSIS — R05 Cough: Secondary | ICD-10-CM | POA: Diagnosis not present

## 2014-10-23 DIAGNOSIS — R1010 Upper abdominal pain, unspecified: Secondary | ICD-10-CM | POA: Diagnosis not present

## 2014-10-23 DIAGNOSIS — Z8719 Personal history of other diseases of the digestive system: Secondary | ICD-10-CM | POA: Diagnosis not present

## 2014-10-23 DIAGNOSIS — I10 Essential (primary) hypertension: Secondary | ICD-10-CM | POA: Diagnosis not present

## 2014-10-23 DIAGNOSIS — Z9049 Acquired absence of other specified parts of digestive tract: Secondary | ICD-10-CM | POA: Diagnosis not present

## 2014-10-23 DIAGNOSIS — R1011 Right upper quadrant pain: Secondary | ICD-10-CM | POA: Diagnosis not present

## 2014-10-23 DIAGNOSIS — R109 Unspecified abdominal pain: Secondary | ICD-10-CM | POA: Diagnosis not present

## 2014-10-23 DIAGNOSIS — R1031 Right lower quadrant pain: Secondary | ICD-10-CM | POA: Diagnosis not present

## 2014-10-28 ENCOUNTER — Telehealth: Payer: Self-pay | Admitting: Internal Medicine

## 2014-10-28 NOTE — Telephone Encounter (Signed)
Pt states she was seen in the ER at [facility] and told she has gastritis and that she needed an EGD may have possible ulcers. Pt did not want to have egd there, she came home Saturday. Pt requests to be seen asap. Pt scheduled to see Dr. Henrene Pastor tomorrow at [facility]. Pt aware of appt and bringing records from ER at [facility] with her.

## 2014-10-28 NOTE — Telephone Encounter (Signed)
Received records from [facility] forwarded to Dr. Scarlette Shorts 10/28/14 fbg.

## 2014-10-29 ENCOUNTER — Ambulatory Visit (INDEPENDENT_AMBULATORY_CARE_PROVIDER_SITE_OTHER): Payer: Medicare Other | Admitting: Internal Medicine

## 2014-10-29 ENCOUNTER — Encounter: Payer: Self-pay | Admitting: Internal Medicine

## 2014-10-29 VITALS — BP 156/90 | HR 80 | Ht 67.75 in | Wt 151.4 lb

## 2014-10-29 DIAGNOSIS — K219 Gastro-esophageal reflux disease without esophagitis: Secondary | ICD-10-CM | POA: Diagnosis not present

## 2014-10-29 DIAGNOSIS — R1084 Generalized abdominal pain: Secondary | ICD-10-CM

## 2014-10-29 NOTE — Progress Notes (Signed)
HISTORY OF PRESENT ILLNESS:  Pamela Snow is a 74 y.o. female  , previous patient of Dr. Verl Blalock, who is added on to today's schedule urgently regarding problems with abdominal pain. She is accompanied by her husband. She has multiple medical problems as listed below with multiple drug allergies and multiple surgeries including cholecystectomy, segmental colectomy for diverticular disease, and hysterectomy. She was last evaluated by Dr. Sharlett Iles November 2014 with a one-week history of "recurrent diffuse abdominal pain described as severe aching pain over the entire abdomen, but tends to localize in her right upper quadrant going into her right lower quadrant." For this similar pain she did undergo cholecystectomy and had gallstones. Because of her pain at that time was uncertain though adhesions felt likely. Contrast-enhanced CT scan of the abdomen and pelvis at that time was unrevealing. Her last colonoscopy was in February 2014 without significant problem found post surgery. No cause for pain found at that time. Upper endoscopy was performed August 2011. This was negative except for mild gastritis and benign fundic gland polyps. Current history is that of increased intestinal gas about 6 weeks ago. Noted that her stools tend to float. While at the beach she developed extreme abdominal discomfort 4 days. The pain was reported as right sided. The patient denies that this is similar to her pain in 2014. She underwent extensive evaluation including CBC, comprehensive metabolic panel, urinalysis, and CT scan of the abdomen and pelvis with IV contrast. All were unremarkable. She was diagnosed with "gastritis" and EGD was recommended. The patient preferred to have this performed in [place]. The patient sits in the chair and appears uncomfortable and somewhat irritable. She currently describes the discomfort as occurring principally from the waist up as a heaviness or pressure sensation. Seems worse  with movements. No affect with meals or defecation. She does take omeprazole chronically for GERD. No active symptoms. Multiple other medications as listed are stable  REVIEW OF SYSTEMS:  All non-GI ROS negative except for arthritis, back pain  Past Medical History  Diagnosis Date  . Diverticulosis     hx of diverticulitis  . Arthritis   . Hypertension   . Hyperparathyroidism   . Adenomatous colon polyp   . B12 deficiency   . Chronic constipation   . Generalized headaches     states never had until trouble with parathyroidism  . GERD (gastroesophageal reflux disease)   . Kidney stones   . Hiatal hernia   . Symptomatic cholelithiasis 05/26/2012    s/p cholecystectomy  . Hx of cardiac catheterization     a. LHC (10/2013):  normal coronary arteries, EF 65%   . Diverticulitis     Past Surgical History  Procedure Laterality Date  . Abdominal hysterectomy  2002  . Total knee arthroplasty  2005    bilateral  . Joint replacement      bilateral  . Tonsillectomy    . Parathyroidectomy  03/12/2011    Procedure: PARATHYROIDECTOMY;  Surgeon: Earnstine Regal, MD;  Location: [facility] ORS;  Service: General;  Laterality: Left;  Left superior parathyroidectomy, minimally invasive   . Laparoscopic colon resection  04/2011    [facility];diverticullitis  . Vaginal hysterectomy    . Cholecystectomy N/A 05/25/2012    Procedure: LAPAROSCOPIC CHOLECYSTECTOMY WITH INTRAOPERATIVE CHOLANGIOGRAM;  Surgeon: Leighton Ruff, MD;  Location: [facility] ORS;  Service: General;  Laterality: N/A;  unable to come before 12  . Left heart catheterization with coronary angiogram N/A 10/12/2013    Procedure: LEFT  HEART CATHETERIZATION WITH CORONARY ANGIOGRAM;  Surgeon: Sinclair Grooms, MD;  Location: [facility] CATH LAB;  Service: Cardiovascular;  Laterality: N/A;    Social History Pamela Snow  reports that she quit smoking about 14 years ago. Her smoking use included Cigarettes. She has never used smokeless tobacco. She  reports that she drinks alcohol. She reports that she does not use illicit drugs.  family history includes Cancer in her mother and sister; Colon cancer in her mother and sister; Heart disease in her father; Heart failure in her mother. There is no history of Esophageal cancer, Rectal cancer, or Stomach cancer.  Allergies  Allergen Reactions  . Ciprofloxacin     Serum sickness    REQUIRING HOSPITALIZATION  . Flagyl [Metronidazole Hcl]     Serum sickness/   REQUIRING HOSPITALIZATION  . Scopolamine     Pt states she becomes very confused and has vertigo with scopolamine patch  . Azactam [Aztreonam] Hives  . Clindamycin/Lincomycin     hives  . Penicillins Hives and Other (See Comments)    Thrush  . Bactrim [Sulfamethoxazole-Trimethoprim]     swollen tongue  . Moviprep [Peg-Kcl-Nacl-Nasulf-Na Asc-C] Other (See Comments)    Pt's abdomen swells with the prep and she never "processes" the solution       PHYSICAL EXAMINATION: Vital signs: BP 156/90 mmHg  Pulse 80  Ht 5' 7.75" (1.721 m)  Wt 151 lb 6 oz (68.663 kg)  BMI 23.18 kg/m2  Constitutional: generally well-appearing, no acute distress Psychiatric: alert and oriented x3, cooperative Eyes: extraocular movements intact, anicteric, conjunctiva pink Mouth: oral pharynx moist, no lesions Neck: supple no lymphadenopathy Cardiovascular: heart regular rate and rhythm, no murmur Lungs: clear to auscultation bilaterally Abdomen: soft, nontender, nondistended, no obvious ascites, no peritoneal signs, normal bowel sounds, no organomegaly. Surgical incisions well-healed Rectal: Omitted Extremities: no clubbing cyanosis or lower extremity edema bilaterally. Surgical scar on knees bilaterally Skin: no lesions on visible extremities Neuro: No focal deficits. Normal deep tendon reflexes.    ASSESSMENT:  #1. Abdominal pain as described. No obvious etiology identified. Suspect this may be functional or secondary to adhesions. Suspect this is  similar problems previously evaluated. Colonoscopy 2014 and upper endoscopy 2011 as described #2. Multiple surgeries including cholecystectomy, segmental colectomy, and hysterectomy #3. GERD. Asymptomatic on PPI #4. Multiple medical problems #5. Multiple drug allergies  PLAN:  #1. Diagnostic upper endoscopy.The nature of the procedure, as well as the risks, benefits, and alternatives were carefully and thoroughly reviewed with the patient. Ample time for discussion and questions allowed. The patient understood, was satisfied, and agreed to proceed. #2. Continue PPI for GERD #3. Discussion on adhesions

## 2014-10-29 NOTE — Patient Instructions (Signed)
You have been scheduled for an endoscopy. Please follow written instructions given to you at your visit today. If you use inhalers (even only as needed), please bring them with you on the day of your procedure.   

## 2014-11-06 ENCOUNTER — Ambulatory Visit: Payer: Medicare Other | Admitting: Internal Medicine

## 2014-11-07 ENCOUNTER — Ambulatory Visit (AMBULATORY_SURGERY_CENTER): Payer: Medicare Other | Admitting: Internal Medicine

## 2014-11-07 ENCOUNTER — Encounter: Payer: Self-pay | Admitting: Internal Medicine

## 2014-11-07 VITALS — BP 140/90 | HR 58 | Temp 97.3°F | Resp 29 | Ht 67.75 in | Wt 151.0 lb

## 2014-11-07 DIAGNOSIS — F341 Dysthymic disorder: Secondary | ICD-10-CM | POA: Diagnosis not present

## 2014-11-07 DIAGNOSIS — R109 Unspecified abdominal pain: Secondary | ICD-10-CM | POA: Diagnosis not present

## 2014-11-07 DIAGNOSIS — R1084 Generalized abdominal pain: Secondary | ICD-10-CM | POA: Diagnosis present

## 2014-11-07 DIAGNOSIS — K219 Gastro-esophageal reflux disease without esophagitis: Secondary | ICD-10-CM | POA: Diagnosis not present

## 2014-11-07 DIAGNOSIS — G43909 Migraine, unspecified, not intractable, without status migrainosus: Secondary | ICD-10-CM | POA: Diagnosis not present

## 2014-11-07 DIAGNOSIS — I440 Atrioventricular block, first degree: Secondary | ICD-10-CM | POA: Diagnosis not present

## 2014-11-07 DIAGNOSIS — G4700 Insomnia, unspecified: Secondary | ICD-10-CM | POA: Diagnosis not present

## 2014-11-07 DIAGNOSIS — E039 Hypothyroidism, unspecified: Secondary | ICD-10-CM | POA: Diagnosis not present

## 2014-11-07 MED ORDER — SODIUM CHLORIDE 0.9 % IV SOLN: 500.0000 mL | INTRAVENOUS | Status: DC

## 2014-11-07 NOTE — Progress Notes (Signed)
Report to PACU, RN, vss, BBS= Clear.  

## 2014-11-07 NOTE — Patient Instructions (Signed)
YOU HAD AN ENDOSCOPIC PROCEDURE TODAY AT [facility]:   Refer to the procedure report that was given to you for any specific questions about what was found during the examination.  If the procedure report does not answer your questions, please call your gastroenterologist to clarify.  If you requested that your care partner not be given the details of your procedure findings, then the procedure report has been included in a sealed envelope for you to review at your convenience later.  YOU SHOULD EXPECT: Some feelings of bloating in the abdomen. Passage of more gas than usual.  Walking can help get rid of the air that was put into your GI tract during the procedure and reduce the bloating. If you had a lower endoscopy (such as a colonoscopy or flexible sigmoidoscopy) you may notice spotting of blood in your stool or on the toilet paper. If you underwent a bowel prep for your procedure, you may not have a normal bowel movement for a few days.  Please Note:  You might notice some irritation and congestion in your nose or some drainage.  This is from the oxygen used during your procedure.  There is no need for concern and it should clear up in a day or so.  SYMPTOMS TO REPORT IMMEDIATELY:   Following upper endoscopy (EGD)  Vomiting of blood or coffee ground material  New chest pain or pain under the shoulder blades  Painful or persistently difficult swallowing  New shortness of breath  Fever of 100F or higher  Black, tarry-looking stools  For urgent or emergent issues, a gastroenterologist can be reached at any hour by calling [phone].   DIET: Your first meal following the procedure should be a small meal and then it is ok to progress to your normal diet. Heavy or fried foods are harder to digest and may make you feel nauseous or bloated.  Likewise, meals heavy in dairy and vegetables can increase bloating.  Drink plenty of fluids but you should avoid alcoholic beverages for  24 hours.  ACTIVITY:  You should plan to take it easy for the rest of today and you should NOT DRIVE or use heavy machinery until tomorrow (because of the sedation medicines used during the test).    FOLLOW UP: Our staff will call the number listed on your records the next business day following your procedure to check on you and address any questions or concerns that you may have regarding the information given to you following your procedure. If we do not reach you, we will leave a message.  However, if you are feeling well and you are not experiencing any problems, there is no need to return our call.  We will assume that you have returned to your regular daily activities without incident.  If any biopsies were taken you will be contacted by phone or by letter within the next 1-3 weeks.  Please call us at [phone] if you have not heard about the biopsies in 3 weeks.    SIGNATURES/CONFIDENTIALITY: You and/or your care partner have signed paperwork which will be entered into your electronic medical record.  These signatures attest to the fact that that the information above on your After Visit Summary has been reviewed and is understood.  Full responsibility of the confidentiality of this discharge information lies with you and/or your care-partner.  Continue with reflux precautions and attention to diet Continue current medications Return to care of Dr. Virgina Jock

## 2014-11-07 NOTE — Op Note (Signed)
[facility]  [address] [place], [id number]   ENDOSCOPY PROCEDURE REPORT  PATIENT: Pamela Snow, Pamela Snow  MR#: [id number] BIRTHDATE: Apr 12, 1940 , 74  yrs. old GENDER: female ENDOSCOPIST: Eustace Quail, MD REFERRED BY:  .  Self / Office PROCEDURE DATE:  11/07/2014 PROCEDURE:  EGD, diagnostic ASA CLASS:     Class II INDICATIONS:  abdominal pain.  . Feeling better since office visit after dietary change MEDICATIONS: Monitored anesthesia care and Propofol 170 mg IV TOPICAL ANESTHETIC: none  DESCRIPTION OF PROCEDURE: After the risks benefits and alternatives of the procedure were thoroughly explained, informed consent was obtained.  The LB [id number] endoscope was introduced through the mouth and advanced to the second portion of the duodenum , Without limitations.  The instrument was slowly withdrawn as the mucosa was fully examined.    EXAM: The esophagus and gastroesophageal junction were completely normal in appearance.  The stomach was entered and closely examined.The antrum, angularis, and lesser curvature were well visualized, including a retroflexed view of the cardia and fundus. The stomach wall was normally distensable.  The scope passed easily through the pylorus into the duodenum.  Retroflexed views revealed no abnormalities.     The scope was then withdrawn from the patient and the procedure completed.  COMPLICATIONS: There were no immediate complications.  ENDOSCOPIC IMPRESSION: 1.  Normal EGD 2.  GERD  RECOMMENDATIONS: 1. Continue with reflux precautions and attention to diet 2. Continue current medications 3. Return to the care of Dr. Virgina Jock  REPEAT EXAM:  eSigned:  Eustace Quail, MD 11/07/2014 7:58 AM    CC:The Patient and Shon Baton, MD

## 2014-11-07 NOTE — Progress Notes (Signed)
Pt took calan this am at 6:20 with 2 sips of water.  This was report to CRNA by Ernestine Conrad, RN.  OK to proceed with scheduled time of procedure.  Pt also denied have a hx of kidney stones.  I am unable to remove this dx.  I did try.  Pt said she would speak with Dr. Virgina Jock about it. maw

## 2014-11-08 ENCOUNTER — Telehealth: Payer: Self-pay | Admitting: *Deleted

## 2014-11-08 NOTE — Telephone Encounter (Signed)
  Follow up Call-  Call back number 11/07/2014 05/15/2012  Post procedure Call Back phone  # [phone] hm [phone] hm  Permission to leave phone message Yes Yes     Patient questions:  Do you have a fever, pain , or abdominal swelling? No. Pain Score  0 *  Have you tolerated food without any problems? Yes.    Have you been able to return to your normal activities? Yes.    Do you have any questions about your discharge instructions: Diet   No. Medications  No. Follow up visit  No.  Do you have questions or concerns about your Care? No.  Actions: * If pain score is 4 or above: No action needed, pain <4.

## 2014-11-25 DIAGNOSIS — H5203 Hypermetropia, bilateral: Secondary | ICD-10-CM | POA: Diagnosis not present

## 2014-11-25 DIAGNOSIS — H524 Presbyopia: Secondary | ICD-10-CM | POA: Diagnosis not present

## 2014-11-25 DIAGNOSIS — H2513 Age-related nuclear cataract, bilateral: Secondary | ICD-10-CM | POA: Diagnosis not present

## 2014-12-16 DIAGNOSIS — R300 Dysuria: Secondary | ICD-10-CM | POA: Diagnosis not present

## 2014-12-16 DIAGNOSIS — N390 Urinary tract infection, site not specified: Secondary | ICD-10-CM | POA: Diagnosis not present

## 2014-12-26 DIAGNOSIS — R300 Dysuria: Secondary | ICD-10-CM | POA: Diagnosis not present

## 2014-12-26 DIAGNOSIS — N390 Urinary tract infection, site not specified: Secondary | ICD-10-CM | POA: Diagnosis not present

## 2015-01-03 DIAGNOSIS — Z23 Encounter for immunization: Secondary | ICD-10-CM | POA: Diagnosis not present

## 2015-03-21 ENCOUNTER — Emergency Department (HOSPITAL_COMMUNITY)
Admission: EM | Admit: 2015-03-21 | Discharge: 2015-03-22 | Disposition: A | Discharge status: Home / Self Care | Payer: Medicare Other | Attending: Emergency Medicine | Admitting: Emergency Medicine

## 2015-03-21 ENCOUNTER — Encounter (HOSPITAL_COMMUNITY): Payer: Self-pay | Admitting: Emergency Medicine

## 2015-03-21 DIAGNOSIS — Z87442 Personal history of urinary calculi: Secondary | ICD-10-CM | POA: Insufficient documentation

## 2015-03-21 DIAGNOSIS — Y9389 Activity, other specified: Secondary | ICD-10-CM | POA: Insufficient documentation

## 2015-03-21 DIAGNOSIS — M1990 Unspecified osteoarthritis, unspecified site: Secondary | ICD-10-CM | POA: Diagnosis not present

## 2015-03-21 DIAGNOSIS — Y998 Other external cause status: Secondary | ICD-10-CM | POA: Insufficient documentation

## 2015-03-21 DIAGNOSIS — Z87891 Personal history of nicotine dependence: Secondary | ICD-10-CM | POA: Diagnosis not present

## 2015-03-21 DIAGNOSIS — K5900 Constipation, unspecified: Secondary | ICD-10-CM | POA: Insufficient documentation

## 2015-03-21 DIAGNOSIS — Z880 Allergy status to penicillin: Secondary | ICD-10-CM | POA: Insufficient documentation

## 2015-03-21 DIAGNOSIS — S0591XA Unspecified injury of right eye and orbit, initial encounter: Secondary | ICD-10-CM | POA: Diagnosis present

## 2015-03-21 DIAGNOSIS — E538 Deficiency of other specified B group vitamins: Secondary | ICD-10-CM | POA: Insufficient documentation

## 2015-03-21 DIAGNOSIS — X58XXXA Exposure to other specified factors, initial encounter: Secondary | ICD-10-CM | POA: Insufficient documentation

## 2015-03-21 DIAGNOSIS — I10 Essential (primary) hypertension: Secondary | ICD-10-CM | POA: Insufficient documentation

## 2015-03-21 DIAGNOSIS — K219 Gastro-esophageal reflux disease without esophagitis: Secondary | ICD-10-CM | POA: Diagnosis not present

## 2015-03-21 DIAGNOSIS — Z86010 Personal history of colonic polyps: Secondary | ICD-10-CM | POA: Insufficient documentation

## 2015-03-21 DIAGNOSIS — S0501XA Injury of conjunctiva and corneal abrasion without foreign body, right eye, initial encounter: Secondary | ICD-10-CM | POA: Diagnosis not present

## 2015-03-21 DIAGNOSIS — Z9889 Other specified postprocedural states: Secondary | ICD-10-CM | POA: Insufficient documentation

## 2015-03-21 DIAGNOSIS — Y9289 Other specified places as the place of occurrence of the external cause: Secondary | ICD-10-CM | POA: Diagnosis not present

## 2015-03-21 DIAGNOSIS — Z7982 Long term (current) use of aspirin: Secondary | ICD-10-CM | POA: Insufficient documentation

## 2015-03-21 DIAGNOSIS — Z79899 Other long term (current) drug therapy: Secondary | ICD-10-CM | POA: Diagnosis not present

## 2015-03-21 MED ORDER — FLUORESCEIN SODIUM 1 MG OP STRP
1.0000 | ORAL_STRIP | Freq: Once | OPHTHALMIC | Status: AC
  Administered 2015-03-21: 1 via OPHTHALMIC
  Filled 2015-03-21: qty 1

## 2015-03-21 MED ORDER — TETRACAINE HCL 0.5 % OP SOLN
1.0000 [drp] | Freq: Once | OPHTHALMIC | Status: AC
  Administered 2015-03-21: 1 [drp] via OPHTHALMIC
  Filled 2015-03-21: qty 2

## 2015-03-21 NOTE — ED Provider Notes (Cosign Needed)
CSN: [id number]     Arrival date & time 03/21/15  2313 History  By signing my name below, I, Pamela Snow, attest that this documentation has been prepared under the direction and in the presence of Debroah Baller, NP.  Electronically Signed: Meriel Snow, ED Scribe. 03/21/2015. 11:36 PM.   Chief Complaint  Patient presents with  . Eye Pain   Patient is a 74 y.o. female presenting with eye pain. The history is provided by the patient. No language interpreter was used.  Eye Pain This is a new problem. The current episode started 1 to 2 hours ago. The problem occurs constantly. The problem has been gradually worsening. Exacerbated by: palpation to right eye. Nothing relieves the symptoms. She has tried nothing for the symptoms. The treatment provided no relief.   HPI Comments: Pamela Snow is a 74 y.o. female who presents to the Emergency Department complaining of sudden onset, constant, severe right eye pain s/p removing her eye makeup with a wipe 1 hour ago. She describes the pain to feel like 'a boulder is under her eyelid' and believes she may have scratched her eye. She has a history of corneal abrasion and describes her current symptom to be similar to this past diagnosis. She wears glasses at baseline. Pt has no other complaints. There is no associated drainage from the eye.  Past Medical History  Diagnosis Date  . Diverticulosis     hx of diverticulitis  . Arthritis   . Hypertension   . Hyperparathyroidism   . Adenomatous colon polyp   . B12 deficiency   . Chronic constipation   . Generalized headaches     states never had until trouble with parathyroidism  . GERD (gastroesophageal reflux disease)   . Kidney stones   . Hiatal hernia   . Symptomatic cholelithiasis 05/26/2012    s/p cholecystectomy  . Hx of cardiac catheterization     a. LHC (10/2013):  normal coronary arteries, EF 65%   . Diverticulitis   . Gall stones    Past Surgical History  Procedure Laterality  Date  . Abdominal hysterectomy  2002  . Total knee arthroplasty  2005    bilateral  . Joint replacement      bilateral  . Tonsillectomy    . Parathyroidectomy  03/12/2011    Procedure: PARATHYROIDECTOMY;  Surgeon: Earnstine Regal, MD;  Location: [facility]S;  Service: General;  Laterality: Left;  Left superior parathyroidectomy, minimally invasive   . Laparoscopic colon resection  04/2011    [facility];diverticullitis  . Vaginal hysterectomy    . Cholecystectomy N/A 05/25/2012    Procedure: LAPAROSCOPIC CHOLECYSTECTOMY WITH INTRAOPERATIVE CHOLANGIOGRAM;  Surgeon: Leighton Ruff, MD;  Location: [facility];  Service: General;  Laterality: N/A;  unable to come before 12  . Left heart catheterization with coronary angiogram N/A 10/12/2013    Procedure: LEFT HEART CATHETERIZATION WITH CORONARY ANGIOGRAM;  Surgeon: Sinclair Grooms, MD;  Location[facility] CATH LAB;  Service: Cardiovascular;  Laterality: N/A;   Family History  Problem Relation Age of Onset  . Colon cancer Mother   . Heart failure Mother   . Cancer Mother     colon  . Colon cancer Sister   . Cancer Sister     colon  . Heart disease Father     heart attack following surgery  . Esophageal cancer Neg Hx   . Rectal cancer Neg Hx   . Stomach cancer Neg Hx    Social History  Substance Use Topics  . Smoking status: Former Smoker    Types: Cigarettes    Quit date: 04/05/2000  . Smokeless tobacco: Never Used     Comment: quit smoking 10 yrs ago  . Alcohol Use: 0.0 oz/week     Comment: weekends and occasional cocktail   OB History    No data available     Review of Systems  Constitutional: Negative for fever.  Eyes: Positive for pain. Negative for discharge.  All other systems reviewed and are negative.  Allergies  Ciprofloxacin; Flagyl; Scopolamine; Azactam; Clindamycin/lincomycin; Penicillins; Bactrim; Moviprep; and Scallops  Home Medications   Prior to Admission medications   Medication Sig Start Date End Date Taking?  Authorizing Provider  ALPRAZolam Duanne Moron) 0.5 MG tablet Take 0.5 mg by mouth daily as needed for anxiety.    Historical Provider, MD  aspirin EC 325 MG tablet Take 325 mg by mouth daily.    Historical Provider, MD  buPROPion (WELLBUTRIN SR) 150 MG 12 hr tablet Take 150 mg by mouth every morning.    Historical Provider, MD  cyanocobalamin (,VITAMIN B-12,) 1000 MCG/ML injection Inject 1,000 mcg into the muscle every 30 (thirty) days. Inject 1 ml into a muscle once weekly for 3 weeks, then inject 1 ml into muscle monthly for a year. 02/22/13   Sable Feil, MD  esomeprazole (NEXIUM) 40 MG capsule Take one capsule twice daily (30 minutes before eating) for one week.  Then, resume one capsule daily. 10/13/13   Liliane Shi, PA-C  Eszopiclone (ESZOPICLONE) 3 MG TABS Take 3 mg by mouth at bedtime as needed (for sleep). Take immediately before bedtime    Historical Provider, MD  Eszopiclone (LUNESTA PO) Take by mouth.    Historical Provider, MD  famotidine (PEPCID) 20 MG tablet Take 40 mg by mouth daily.    Historical Provider, MD  HYDROcodone-acetaminophen (NORCO) 5-325 MG tablet Take 1 tablet by mouth every 6 (six) hours as needed. 03/22/15   Hope Bunnie Pion, NP  levothyroxine (SYNTHROID, LEVOTHROID) 50 MCG tablet Take 50 mcg by mouth every morning.     Historical Provider, MD  polyethylene glycol powder (GLYCOLAX/MIRALAX) powder Take 34 g by mouth daily.    Historical Provider, MD  Temazepam (RESTORIL PO) Take 1 tablet by mouth at bedtime as needed (for sleep).     Historical Provider, MD  topiramate (TOPAMAX) 25 MG capsule Take 75 mg by mouth at bedtime.     Historical Provider, MD  verapamil (CALAN) 120 MG tablet Take 120 mg by mouth 2 (two) times daily.     Historical Provider, MD   BP 166/88 mmHg  Pulse 72  Temp(Src) 97.9 F (36.6 C) (Oral)  Resp 18  SpO2 100% Physical Exam  Constitutional: She is oriented to person, place, and time. She appears well-developed and well-nourished. No distress.   HENT:  Head: Normocephalic.  Eyes: Conjunctivae, EOM and lids are normal. Pupils are equal, round, and reactive to light. Lids are everted and swept, no foreign bodies found.  Fundoscopic exam:      The right eye shows no exudate and no hemorrhage.  Slit lamp exam:      The right eye shows corneal abrasion and fluorescein uptake. The right eye shows no foreign body.    pH 7   Neck: Normal range of motion. Neck supple.  Cardiovascular: Normal rate.   Pulmonary/Chest: Effort normal. No respiratory distress.  Musculoskeletal: Normal range of motion.  Neurological: She is alert and oriented to person,  place, and time. Coordination normal.  Skin: Skin is warm.  Psychiatric: She has a normal mood and affect. Her behavior is normal.  Nursing note and vitals reviewed.   ED Course  Procedures  Dr. Leonides Schanz in to examine the patient.   pH 7  Visual acuity R20/30 L20/30 B20/30  DIAGNOSTIC STUDIES: Oxygen Saturation is 100% on RA, normal by my interpretation.    COORDINATION OF CARE: 11:35 PM Discussed treatment plan which includes to perform eye exam with pt. Pt acknowledges and agrees to plan.    MDM  74 y.o. female with right eye pain and irritation after using a makeup cleaner pad to take off her eye makeup. Stable for d/c without loss of vision. Will treat for corneal abrasion and she will follow up with her ophthalmologist or return here as needed. Erythromycin Opth. Ointment with fist dose instilled prior to d/c. Hydrocodone for pain.  Final diagnoses:  Corneal abrasion, right, initial encounter   I personally performed the services described in this documentation, which was scribed in my presence. The recorded information has been reviewed and is accurate.    [facility] Bunnie Pion, [place] 03/22/15 [phone]

## 2015-03-21 NOTE — ED Notes (Signed)
Pt presents to ED for assessment of eye pain (r eye) that began after patient was removing her makeup and getting ready for bed

## 2015-03-22 DIAGNOSIS — S0501XA Injury of conjunctiva and corneal abrasion without foreign body, right eye, initial encounter: Secondary | ICD-10-CM | POA: Diagnosis not present

## 2015-03-22 MED ORDER — HYDROCODONE-ACETAMINOPHEN 5-325 MG PO TABS
1.0000 | ORAL_TABLET | Freq: Once | ORAL | Status: AC
  Administered 2015-03-22: 1 via ORAL
  Filled 2015-03-22: qty 1

## 2015-03-22 MED ORDER — HYDROCODONE-ACETAMINOPHEN 5-325 MG PO TABS
1.0000 | ORAL_TABLET | Freq: Four times a day (QID) | ORAL | Status: DC | PRN
Start: 2015-03-22 — End: 2019-04-24

## 2015-03-22 MED ORDER — ERYTHROMYCIN 5 MG/GM OP OINT
1.0000 "application " | TOPICAL_OINTMENT | Freq: Once | OPHTHALMIC | Status: AC
  Administered 2015-03-22: 1 via OPHTHALMIC
  Filled 2015-03-22: qty 3.5

## 2015-03-22 NOTE — ED Notes (Signed)
Verified with Pharmacy okay to give erythromycin opthalmic ointment with patient's antibiotic allergies

## 2015-03-22 NOTE — ED Notes (Signed)
MD at bedside. 

## 2015-03-22 NOTE — Discharge Instructions (Signed)

## 2015-03-22 NOTE — ED Notes (Signed)
NP at bedside.

## 2015-03-22 NOTE — ED Notes (Signed)
Patient able to ambulate independently  

## 2015-03-22 NOTE — ED Notes (Signed)
Administration education done for eye ointment with patient.

## 2015-03-22 NOTE — ED Provider Notes (Signed)
Medical screening examination/treatment/procedure(s) were conducted as a shared visit with non-physician practitioner(s) and myself.  I personally evaluated the patient during the encounter.   EKG Interpretation None       Pt is a 74 y.o. F who wears glasses at baseline who presents emergency department with right eye pain, tearing. Patient states this started after removing her makeup. She has a corneal abrasion at the 7:00 position seen with increased fluoroscein uptake. Now that her eye has been anesthetized she reports her vision is at baseline. We'll discharge with antibiotic ointment and follow up with her optometrist in 48 hours.  [place], DO 03/22/15 [phone]

## 2015-03-25 DIAGNOSIS — S0501XA Injury of conjunctiva and corneal abrasion without foreign body, right eye, initial encounter: Secondary | ICD-10-CM | POA: Diagnosis not present

## 2015-06-25 DIAGNOSIS — Z6823 Body mass index (BMI) 23.0-23.9, adult: Secondary | ICD-10-CM | POA: Diagnosis not present

## 2015-06-25 DIAGNOSIS — E538 Deficiency of other specified B group vitamins: Secondary | ICD-10-CM | POA: Diagnosis not present

## 2015-06-25 DIAGNOSIS — D6489 Other specified anemias: Secondary | ICD-10-CM | POA: Diagnosis not present

## 2015-06-25 DIAGNOSIS — R1031 Right lower quadrant pain: Secondary | ICD-10-CM | POA: Diagnosis not present

## 2015-06-25 DIAGNOSIS — D649 Anemia, unspecified: Secondary | ICD-10-CM | POA: Diagnosis not present

## 2015-06-26 ENCOUNTER — Other Ambulatory Visit: Payer: Self-pay | Admitting: Internal Medicine

## 2015-06-26 ENCOUNTER — Ambulatory Visit
Admission: RE | Admit: 2015-06-26 | Discharge: 2015-06-26 | Disposition: A | Discharge status: Home / Self Care | Payer: Medicare Other | Source: Ambulatory Visit | Attending: Internal Medicine | Admitting: Internal Medicine

## 2015-06-26 ENCOUNTER — Telehealth: Payer: Self-pay | Admitting: Internal Medicine

## 2015-06-26 DIAGNOSIS — R1031 Right lower quadrant pain: Secondary | ICD-10-CM

## 2015-06-26 DIAGNOSIS — D649 Anemia, unspecified: Secondary | ICD-10-CM

## 2015-06-26 DIAGNOSIS — N281 Cyst of kidney, acquired: Secondary | ICD-10-CM | POA: Diagnosis not present

## 2015-06-26 MED ORDER — IOPAMIDOL (ISOVUE-300) INJECTION 61%
100.0000 mL | Freq: Once | INTRAVENOUS | Status: AC | PRN
  Administered 2015-06-26: 100 mL via INTRAVENOUS

## 2015-06-26 NOTE — Telephone Encounter (Signed)
Pt scheduled to see Alonza Bogus PA 07/08/15@9am . Left message for pt to call back.

## 2015-06-26 NOTE — Telephone Encounter (Signed)
Pt aware of appt.

## 2015-07-03 ENCOUNTER — Other Ambulatory Visit (INDEPENDENT_AMBULATORY_CARE_PROVIDER_SITE_OTHER): Payer: Medicare Other

## 2015-07-03 ENCOUNTER — Ambulatory Visit (INDEPENDENT_AMBULATORY_CARE_PROVIDER_SITE_OTHER): Payer: Medicare Other | Admitting: Physician Assistant

## 2015-07-03 ENCOUNTER — Encounter: Payer: Self-pay | Admitting: Physician Assistant

## 2015-07-03 VITALS — BP 132/90 | HR 76 | Ht 67.75 in | Wt 154.0 lb

## 2015-07-03 DIAGNOSIS — R1011 Right upper quadrant pain: Secondary | ICD-10-CM | POA: Diagnosis not present

## 2015-07-03 DIAGNOSIS — Z1212 Encounter for screening for malignant neoplasm of rectum: Secondary | ICD-10-CM | POA: Diagnosis not present

## 2015-07-03 DIAGNOSIS — K2980 Duodenitis without bleeding: Secondary | ICD-10-CM

## 2015-07-03 LAB — CBC WITH DIFFERENTIAL/PLATELET
Basophils Absolute: 0 10*3/uL (ref 0.0–0.1)
Basophils Relative: 0.5 % (ref 0.0–3.0)
Eosinophils Absolute: 0.1 10*3/uL (ref 0.0–0.7)
Eosinophils Relative: 2.3 % (ref 0.0–5.0)
HCT: 40 % (ref 36.0–46.0)
Hemoglobin: 13.6 g/dL (ref 12.0–15.0)
Lymphocytes Relative: 20.7 % (ref 12.0–46.0)
Lymphs Abs: 1.2 10*3/uL (ref 0.7–4.0)
MCHC: 33.9 g/dL (ref 30.0–36.0)
MCV: 91.2 fl (ref 78.0–100.0)
Monocytes Absolute: 0.5 10*3/uL (ref 0.1–1.0)
Monocytes Relative: 9.2 % (ref 3.0–12.0)
Neutro Abs: 4 10*3/uL (ref 1.4–7.7)
Neutrophils Relative %: 67.3 % (ref 43.0–77.0)
Platelets: 300 10*3/uL (ref 150.0–400.0)
RBC: 4.38 Mil/uL (ref 3.87–5.11)
RDW: 12.8 % (ref 11.5–15.5)
WBC: 5.9 10*3/uL (ref 4.0–10.5)

## 2015-07-03 MED ORDER — SUCRALFATE 1 G PO TABS: ORAL_TABLET | ORAL | Status: DC

## 2015-07-03 NOTE — Patient Instructions (Addendum)
Please go to the basement level to have your labs drawn.  Continue the Nexium 40 mg, 1 tab every morning.  Take Carafate 1 gram between meals and at bedtime x 2 weeks. Take no aspirin or anti-inflammatory medicartions, ( Advil, Ibuprofen, Aleve ).  Call if symptoms presist .     Normal BMI (Body Mass Index- based on height and weight) is between 23 and 30. Your BMI today is Body mass index is 23.58 kg/(m^2). Marland Kitchen   Today your blood pressure was elevated. Please follow up with your Primary Care Provider for blood pressure management.

## 2015-07-03 NOTE — Progress Notes (Signed)
Patient ID: Pamela Snow, female   DOB: 09-30-1940, 75 y.o.   MRN: [id number]   Subjective:    Patient ID: Pamela Snow, female    DOB: 1941/02/07, 75 y.o.   MRN: [id number]  HPI  Pamela Snow is a 75 year old white female known to Dr. Henrene Snow. She is status post cholecystectomy and segmental colectomy for diverticular disease as well as hysterectomy. Last colonoscopy was done in February 2014 and negative. EGD in August 2016 after complaints of upper abdominal pain was normal. She feels that she had gastritis at that time. Patient says that she has had GI issues off and on for years and that Dr. Sharlett Snow told her that she had "weird gut" he has just had an episode of right upper quadrant pain that started about 5-6 days ago with constant bad pain talked up under her ribs on the right without radiation. She says this is gradually improved and is now more just discomfort. She has not had any nausea or vomiting though appetite has been decreased and she does feel that pain is exacerbated 15-20 minutes after eating. In bowel habits melena or hematochezia. She had been taking Motrin regularly for back pain for several days prior to onset of these symptoms. Days on Nexium chronically for GERD. She had been to her PCP Dr. Virgina Snow, had CT of the abdomen and pelvis done on 06/30/2015 which is unremarkable. Labs were done last week and showed a hemoglobin of 9.6 hematocrit 29.0 MCV of 98 labs were repeated earlier today at Dr. Keane Snow office with hemoglobin of 13.7 hematocrit of 40.3 MCV of 97. Iron studies done last week were unremarkable, B12- 242.  Review of Systems Pertinent positive and negative review of systems were noted in the above HPI section.  All other review of systems was otherwise negative.  Outpatient Encounter Prescriptions as of 07/03/2015  Medication Sig  . Pamela Snow (Pamela Snow) 0.5 MG tablet Take 0.5 mg by mouth daily as needed for anxiety.  Marland Kitchen aspirin EC 325 MG tablet Take 325 mg by mouth  daily.  Marland Kitchen buPROPion (Pamela Snow SR) 150 MG 12 hr tablet Take 150 mg by mouth every morning.  . Pamela Snow (,VITAMIN B-12,) 1000 MCG/ML injection Inject 1,000 mcg into the muscle every 30 (thirty) days. Inject 1 ml into a muscle once weekly for 3 weeks, then inject 1 ml into muscle monthly for a year.  . Pamela Snow (NEXIUM) 40 MG capsule Take one capsule twice daily (30 minutes before eating) for one week.  Then, resume one capsule daily.  . Pamela Snow (Pamela Snow) 3 MG TABS Take 3 mg by mouth at bedtime as needed (for sleep). Take immediately before bedtime  . Pamela Snow (Pamela Snow PO) Take by mouth.  . Pamela Snow (Pamela Snow) 20 MG tablet Take 40 mg by mouth daily.  Marland Kitchen Pamela Snow (Pamela Snow) 5-325 MG tablet Take 1 tablet by mouth every 6 (six) hours as needed.  Marland Kitchen Pamela Snow (Pamela Snow, Pamela Snow) 50 MCG tablet Take 50 mcg by mouth every morning.   . Pamela Snow powder (Pamela Snow/Pamela Snow) powder Take 34 g by mouth daily.  . Pamela Snow (Pamela Snow PO) Take 1 tablet by mouth at bedtime as needed (for sleep).   . Pamela Snow (Pamela Snow) 25 MG capsule Take 75 mg by mouth at bedtime.   . Pamela Snow (Pamela Snow) 120 MG tablet Take 120 mg by mouth 2 (two) times daily.   . Pamela Snow (Pamela Snow) 1 g tablet Take 1 gram between meals and at bedtime.   Facility-Administered Encounter Medications as of 07/03/2015  Medication  . Pamela Snow ((  VITAMIN B-12)) injection 1,000 mcg   Allergies  Allergen Reactions  . Ciprofloxacin     Serum sickness    REQUIRING HOSPITALIZATION  . Flagyl [Metronidazole Hcl]     Serum sickness/   REQUIRING HOSPITALIZATION  . Scopolamine     Pt states she becomes very confused and has vertigo with scopolamine patch  . Azactam [Aztreonam] Hives  . Clindamycin/Lincomycin     hives  . Penicillins Hives and Other (See Comments)    Thrush  . Bactrim [Sulfamethoxazole-Trimethoprim]     swollen tongue  . Moviprep [Peg-Kcl-Nacl-Nasulf-Na Asc-C] Other (See Comments)     Pt's abdomen swells with the prep and she never "processes" the solution  . Scallops [Shellfish Allergy] Nausea And Vomiting   Patient Active Problem List   Diagnosis Date Noted  . HTN (hypertension) 10/12/2013  . Unstable Angina 10/11/2013  . Symptomatic cholelithiasis 05/26/2012  . History of colonic diverticulitis, recurrent 03/17/2011  . Hyperparathyroidism, primary ([place]) 12/02/2010  . FHx: colon cancer 09/08/2010  . Adverse drug reaction 09/08/2010  . History of gastroesophageal reflux (GERD) 09/08/2010  . Diverticulitis large intestine 07/22/2010  . Adenomatous polyp of colon 07/13/2010  . PARONYCHIA, TOE 10/28/2009  . VITAMIN B12 DEFICIENCY 10/13/2009  . GERD 07/21/2007  . HIATAL HERNIA 07/21/2007  . DIVERTICULOSIS, ASYMPTOMATIC 07/21/2007  . CONSTIPATION, CHRONIC 07/21/2007  . ARTHRITIS 07/21/2007   Social History   Social History  . Marital Status: Married    Spouse Name: N/A  . Number of Children: 3  . Years of Education: N/A   Occupational History  . retired    Social History Main Topics  . Smoking status: Former Smoker    Types: Cigarettes    Quit date: 04/05/2000  . Smokeless tobacco: Never Used     Comment: quit smoking 10 yrs ago  . Alcohol Use: 0.0 oz/week     Comment: weekends and occasional cocktail  . Drug Use: No  . Sexual Activity: No   Other Topics Concern  . Not on file   Social History Narrative    Pamela Snow's family history includes Cancer in her mother and sister; Colon cancer in her mother and sister; Heart disease in her father; Heart failure in her mother. There is no history of Esophageal cancer, Rectal cancer, or Stomach cancer.      Objective:    Filed Vitals:   07/03/15 1330 07/03/15 1422  BP: 130/92 132/90  Pulse: 76     Physical Exam  well-developed older white female in no acute distress, a pressure 132/90 pulse in the 70s. HEENT; nontraumatic, cephalic EOMI PERRLA sclera anicteric, Cardiovascular ;regular rate  and rhythm with S1-S2 no murmur or gallop, Pulmonary ;clear bilaterally  Abd;soft is mildly tender in the epigastric and right upper quadrant no palpable mass or hepatosplenomegaly bowel sounds are present Rectal ;exam not done, Ext; no clubbing cyanosis or edema skin warm and dry, Neuropsych; mood and affect appropriate     Assessment & Plan:   #1 75 yo female With 5-6 day history of constant dull right upper quadrant pain improving without specific therapy. I suspect she had an NSAID-induced gastritis or duodenitis as she had been taking ibuprofen regularly prior to onset #2 discrepancy in recent labs with labs on 322 showing anemia and hemoglobin of 9.6 and repeat labs today at [facility] medical hemoglobin 13.7. #3 status post cholecystectomy #4 status post segmental colectomy for diverticular disease #5 constipation chronic #6 history of B12 deficiency   #7 family history of colon  cancer in father  Plan; repeat CBC to confirm that she is not anemic Continue Nexium 40 mg by mouth every morning Add Pamela Snow 1 g between meals and at bedtime 2 weeks Avoid all NSAIDs She is asked to call if her symptoms have not resolved after a two-week course of Pamela Snow.   Sarissa Dern S Leanna Hamid PA-C 07/03/2015   Cc: Shon Baton, MD

## 2015-07-04 ENCOUNTER — Telehealth: Payer: Self-pay | Admitting: Physician Assistant

## 2015-07-04 NOTE — Progress Notes (Signed)
Agree with initial assessment and plans as outlined 

## 2015-07-04 NOTE — Telephone Encounter (Signed)
Advised normal CBC

## 2015-07-08 ENCOUNTER — Ambulatory Visit: Payer: Medicare Other | Admitting: Gastroenterology

## 2015-07-27 ENCOUNTER — Emergency Department (HOSPITAL_COMMUNITY)
Admission: EM | Admit: 2015-07-27 | Discharge: 2015-07-27 | Disposition: A | Discharge status: Home / Self Care | Payer: Medicare Other | Attending: Emergency Medicine | Admitting: Emergency Medicine

## 2015-07-27 ENCOUNTER — Emergency Department (HOSPITAL_COMMUNITY): Payer: Medicare Other

## 2015-07-27 ENCOUNTER — Encounter (HOSPITAL_COMMUNITY): Payer: Self-pay | Admitting: Emergency Medicine

## 2015-07-27 DIAGNOSIS — M1990 Unspecified osteoarthritis, unspecified site: Secondary | ICD-10-CM | POA: Diagnosis not present

## 2015-07-27 DIAGNOSIS — Z880 Allergy status to penicillin: Secondary | ICD-10-CM | POA: Diagnosis not present

## 2015-07-27 DIAGNOSIS — I10 Essential (primary) hypertension: Secondary | ICD-10-CM | POA: Insufficient documentation

## 2015-07-27 DIAGNOSIS — Z7982 Long term (current) use of aspirin: Secondary | ICD-10-CM | POA: Insufficient documentation

## 2015-07-27 DIAGNOSIS — E213 Hyperparathyroidism, unspecified: Secondary | ICD-10-CM | POA: Diagnosis not present

## 2015-07-27 DIAGNOSIS — R110 Nausea: Secondary | ICD-10-CM | POA: Diagnosis not present

## 2015-07-27 DIAGNOSIS — Z87442 Personal history of urinary calculi: Secondary | ICD-10-CM | POA: Diagnosis not present

## 2015-07-27 DIAGNOSIS — Z9889 Other specified postprocedural states: Secondary | ICD-10-CM | POA: Diagnosis not present

## 2015-07-27 DIAGNOSIS — R519 Headache, unspecified: Secondary | ICD-10-CM

## 2015-07-27 DIAGNOSIS — R51 Headache: Secondary | ICD-10-CM

## 2015-07-27 DIAGNOSIS — K5900 Constipation, unspecified: Secondary | ICD-10-CM | POA: Diagnosis not present

## 2015-07-27 DIAGNOSIS — Z79899 Other long term (current) drug therapy: Secondary | ICD-10-CM | POA: Diagnosis not present

## 2015-07-27 DIAGNOSIS — K219 Gastro-esophageal reflux disease without esophagitis: Secondary | ICD-10-CM | POA: Insufficient documentation

## 2015-07-27 DIAGNOSIS — Z86010 Personal history of colonic polyps: Secondary | ICD-10-CM | POA: Diagnosis not present

## 2015-07-27 DIAGNOSIS — J019 Acute sinusitis, unspecified: Secondary | ICD-10-CM | POA: Insufficient documentation

## 2015-07-27 DIAGNOSIS — R42 Dizziness and giddiness: Secondary | ICD-10-CM | POA: Diagnosis not present

## 2015-07-27 DIAGNOSIS — Z87891 Personal history of nicotine dependence: Secondary | ICD-10-CM | POA: Insufficient documentation

## 2015-07-27 DIAGNOSIS — E538 Deficiency of other specified B group vitamins: Secondary | ICD-10-CM | POA: Insufficient documentation

## 2015-07-27 DIAGNOSIS — R404 Transient alteration of awareness: Secondary | ICD-10-CM | POA: Diagnosis not present

## 2015-07-27 LAB — BASIC METABOLIC PANEL
Anion gap: 12 (ref 5–15)
BUN: 16 mg/dL (ref 6–20)
CO2: 19 mmol/L — ABNORMAL LOW (ref 22–32)
Calcium: 9.2 mg/dL (ref 8.9–10.3)
Chloride: 105 mmol/L (ref 101–111)
Creatinine, Ser: 0.94 mg/dL (ref 0.44–1.00)
GFR calc Af Amer: >60 mL/min (ref >60)
GFR calc non Af Amer: 59 mL/min — ABNORMAL LOW (ref >60)
Glucose, Bld: 108 mg/dL — ABNORMAL HIGH (ref 65–99)
Potassium: 3.5 mmol/L (ref 3.5–5.1)
Sodium: 136 mmol/L (ref 135–145)

## 2015-07-27 LAB — CBC WITH DIFFERENTIAL/PLATELET
Basophils Absolute: 0 10*3/uL (ref 0.0–0.1)
Basophils Relative: 0 %
Eosinophils Absolute: 0 10*3/uL (ref 0.0–0.7)
Eosinophils Relative: 1 %
HCT: 40 % (ref 36.0–46.0)
Hemoglobin: 13.7 g/dL (ref 12.0–15.0)
Lymphocytes Relative: 16 %
Lymphs Abs: 0.7 10*3/uL (ref 0.7–4.0)
MCH: 31.1 pg (ref 26.0–34.0)
MCHC: 34.3 g/dL (ref 30.0–36.0)
MCV: 90.7 fL (ref 78.0–100.0)
Monocytes Absolute: 0.4 10*3/uL (ref 0.1–1.0)
Monocytes Relative: 8 %
Neutro Abs: 3.3 10*3/uL (ref 1.7–7.7)
Neutrophils Relative %: 75 %
Platelets: 244 10*3/uL (ref 150–400)
RBC: 4.41 MIL/uL (ref 3.87–5.11)
RDW: 11.8 % (ref 11.5–15.5)
WBC: 4.4 10*3/uL (ref 4.0–10.5)

## 2015-07-27 LAB — TROPONIN I: Troponin I: <0.03 ng/mL (ref <0.031)

## 2015-07-27 MED ORDER — SODIUM CHLORIDE 0.9 % IV BOLUS (SEPSIS)
1000.0000 mL | Freq: Once | INTRAVENOUS | Status: AC
  Administered 2015-07-27: 1000 mL via INTRAVENOUS

## 2015-07-27 MED ORDER — DIPHENHYDRAMINE HCL 50 MG/ML IJ SOLN
25.0000 mg | Freq: Once | INTRAMUSCULAR | Status: AC
  Administered 2015-07-27: 25 mg via INTRAVENOUS
  Filled 2015-07-27: qty 1

## 2015-07-27 MED ORDER — FLUTICASONE PROPIONATE 50 MCG/ACT NA SUSP: 2.0000 | Freq: Every day | NASAL | Status: DC

## 2015-07-27 MED ORDER — PSEUDOEPHEDRINE HCL 60 MG PO TABS: 60.0000 mg | ORAL_TABLET | Freq: Four times a day (QID) | ORAL | Status: DC

## 2015-07-27 MED ORDER — PROCHLORPERAZINE EDISYLATE 5 MG/ML IJ SOLN
10.0000 mg | Freq: Once | INTRAMUSCULAR | Status: AC
  Administered 2015-07-27: 10 mg via INTRAVENOUS
  Filled 2015-07-27: qty 2

## 2015-07-27 NOTE — ED Notes (Signed)
Patient able to ambulate independently  

## 2015-07-27 NOTE — ED Provider Notes (Signed)
CSN: [id number]     Arrival date & time 07/27/15  1614 History   First MD Initiated Contact with Patient 07/27/15 1616     Chief Complaint  Patient presents with  . Dizziness  . Nausea  . Emesis  . Nasal Congestion     (Consider location/radiation/quality/duration/timing/severity/associated sxs/prior Treatment) HPI   Pt with hx HTN, migraine headaches p/w headache, dizziness/lightheadedness, N/V that has gotten progressively worse x 4 days.  She was initially sick 6 days ago with fever to 100, nasal congestion, white and green nasal discharge, sneezing, sore throat, dry cough - prescribed z-pak 4 days ago (called in by her PCP, no office visit).  The headache is in her forehead, pulsating/pounding, worse with movement of her head.  Associated dizziness/lightheadedness, nausea.  Feels she should not stand or walk.   Typical migraine for her attacks suddenly, is "just pain" not throbbing or pounding, resolves in 20-30 minutes with vicodin.  Takes topamax for control.    Past Medical History  Diagnosis Date  . Diverticulosis     hx of diverticulitis  . Arthritis   . Hypertension   . Hyperparathyroidism   . Adenomatous colon polyp   . B12 deficiency   . Chronic constipation   . Generalized headaches     states never had until trouble with parathyroidism  . GERD (gastroesophageal reflux disease)   . Kidney stones   . Hiatal hernia   . Symptomatic cholelithiasis 05/26/2012    s/p cholecystectomy  . Hx of cardiac catheterization     a. LHC (10/2013):  normal coronary arteries, EF 65%   . Diverticulitis   . Gall stones    Past Surgical History  Procedure Laterality Date  . Abdominal hysterectomy  2002  . Total knee arthroplasty  2005    bilateral  . Joint replacement      bilateral  . Tonsillectomy    . Parathyroidectomy  03/12/2011    Procedure: PARATHYROIDECTOMY;  Surgeon: Earnstine Regal, MD;  Location: [facility];  Service: General;  Laterality: Left;  Left superior  parathyroidectomy, minimally invasive   . Laparoscopic colon resection  04/2011    [facility];diverticullitis  . Vaginal hysterectomy    . Cholecystectomy N/A 05/25/2012    Procedure: LAPAROSCOPIC CHOLECYSTECTOMY WITH INTRAOPERATIVE CHOLANGIOGRAM;  Surgeon: Leighton Ruff, MD;  Location: [facility];  Service: General;  Laterality: N/A;  unable to come before 12  . Left heart catheterization with coronary angiogram N/A 10/12/2013    Procedure: LEFT HEART CATHETERIZATION WITH CORONARY ANGIOGRAM;  Surgeon: Sinclair Grooms, MD;  Location: [facility] CATH LAB;  Service: Cardiovascular;  Laterality: N/A;   Family History  Problem Relation Age of Onset  . Colon cancer Mother   . Heart failure Mother   . Cancer Mother     colon  . Colon cancer Sister   . Cancer Sister     colon  . Heart disease Father     heart attack following surgery  . Esophageal cancer Neg Hx   . Rectal cancer Neg Hx   . Stomach cancer Neg Hx    Social History  Substance Use Topics  . Smoking status: Former Smoker    Types: Cigarettes    Quit date: 04/05/2000  . Smokeless tobacco: Never Used     Comment: quit smoking 10 yrs ago  . Alcohol Use: 0.0 oz/week     Comment: weekends and occasional cocktail   OB History    No data available  Review of Systems  All other systems reviewed and are negative.     Allergies  Ciprofloxacin; Flagyl; Scopolamine; Azactam; Clindamycin/lincomycin; Penicillins; Bactrim; Moviprep; and Scallops  Home Medications   Prior to Admission medications   Medication Sig Start Date End Date Taking? Authorizing Provider  ALPRAZolam Duanne Moron) 0.5 MG tablet Take 0.5 mg by mouth daily as needed for anxiety.    Historical Provider, MD  aspirin EC 325 MG tablet Take 325 mg by mouth daily.    Historical Provider, MD  buPROPion (WELLBUTRIN SR) 150 MG 12 hr tablet Take 150 mg by mouth every morning.    Historical Provider, MD  cyanocobalamin (,VITAMIN B-12,) 1000 MCG/ML injection Inject 1,000 mcg  into the muscle every 30 (thirty) days. Inject 1 ml into a muscle once weekly for 3 weeks, then inject 1 ml into muscle monthly for a year. 02/22/13   Sable Feil, MD  esomeprazole (NEXIUM) 40 MG capsule Take one capsule twice daily (30 minutes before eating) for one week.  Then, resume one capsule daily. 10/13/13   Liliane Shi, PA-C  Eszopiclone (ESZOPICLONE) 3 MG TABS Take 3 mg by mouth at bedtime as needed (for sleep). Take immediately before bedtime    Historical Provider, MD  Eszopiclone (LUNESTA PO) Take by mouth.    Historical Provider, MD  famotidine (PEPCID) 20 MG tablet Take 40 mg by mouth daily.    Historical Provider, MD  HYDROcodone-acetaminophen (NORCO) 5-325 MG tablet Take 1 tablet by mouth every 6 (six) hours as needed. 03/22/15   Hope Bunnie Pion, NP  levothyroxine (SYNTHROID, LEVOTHROID) 50 MCG tablet Take 50 mcg by mouth every morning.     Historical Provider, MD  polyethylene glycol powder (GLYCOLAX/MIRALAX) powder Take 34 g by mouth daily.    Historical Provider, MD  sucralfate (CARAFATE) 1 g tablet Take 1 gram between meals and at bedtime. 07/03/15   Amy S Esterwood, PA-C  Temazepam (RESTORIL PO) Take 1 tablet by mouth at bedtime as needed (for sleep).     Historical Provider, MD  topiramate (TOPAMAX) 25 MG capsule Take 75 mg by mouth at bedtime.     Historical Provider, MD  verapamil (CALAN) 120 MG tablet Take 120 mg by mouth 2 (two) times daily.     Historical Provider, MD   BP 164/94 mmHg  Pulse 82  Temp(Src) 98.3 F (36.8 C) (Oral)  Resp 14  Ht 5\' 8"  (1.727 m)  Wt 65.772 kg  BMI 22.05 kg/m2  SpO2 95% Physical Exam  Constitutional: She appears well-developed and well-nourished. No distress.  HENT:  Head: Normocephalic and atraumatic.  Neck: Neck supple.  Cardiovascular: Normal rate and regular rhythm.   Pulmonary/Chest: Effort normal and breath sounds normal. No respiratory distress. She has no wheezes. She has no rales.  Abdominal: Soft. She exhibits no  distension. There is no tenderness. There is no rebound and no guarding.  Neurological: She is alert.  CN II-XII intact, EOMs intact, no pronator drift, grip strengths equal bilaterally; strength 5/5 in all extremities, sensation intact in all extremities; finger to nose, heel to shin, rapid alternating movements normal.  Gait testing deferred    Skin: She is not diaphoretic.  Nursing note and vitals reviewed.   ED Course  Procedures (including critical care time) Labs Review Labs Reviewed  BASIC METABOLIC PANEL - Abnormal; Notable for the following:    CO2 19 (*)    Glucose, Bld 108 (*)    GFR calc non Af Amer 59 (*)  All other components within normal limits  CBC WITH DIFFERENTIAL/PLATELET  TROPONIN I    Imaging Review Ct Head Wo Contrast  07/27/2015  CLINICAL DATA:  Headache x4 days, dizziness EXAM: CT HEAD WITHOUT CONTRAST TECHNIQUE: Contiguous axial images were obtained from the base of the skull through the vertex without intravenous contrast. COMPARISON:  MR brain dated 11/07/2010 FINDINGS: No evidence of parenchymal hemorrhage or extra-axial fluid collection. No mass lesion, mass effect, or midline shift. No CT evidence of acute infarction. Mild cortical atrophy.  No ventriculomegaly. Prominent VR space in the right basal ganglia (series 2/image 10). Partial opacification of the bilateral ethmoid sinuses. Mild layering fluid in the bilateral frontal, maxillary, and left sphenoid sinuses. Bilateral mastoid air cells are essentially clear. No evidence of calvarial fracture. IMPRESSION: Paranasal sinus opacification, as above. Otherwise, no evidence of acute intracranial abnormality. Electronically Signed   By: Julian Hy M.D.   On: 07/27/2015 18:10   I have personally reviewed and evaluated these images and lab results as part of my medical decision-making.   EKG Interpretation   Date/Time:  Sunday July 27 2015 16:23:41 EDT Ventricular Rate:  83 PR Interval:  193 QRS  Duration: 94 QT Interval:  373 QTC Calculation: 438 R Axis:   29 Text Interpretation:  Sinus rhythm ST-t wave abnormality Abnormal ekg  Confirmed by Carmin Muskrat  MD ([id number]) on 07/27/2015 4:48:45 PM       6:29 PM Pt reports great relief after IVF, compazine, benadryl.  Headache now 3/10, pt has been resting comfortably.   7:02 PM Pt ambulates evenly and without difficulty to the bathroom.  MDM   Final diagnoses:  Acute nonintractable headache, unspecified headache type  Acute sinusitis, recurrence not specified, unspecified location    Afebrile nontoxic patient with 4 days of gradually worsening headache, lightheadedness/dizziness, N/V.  No neurologic deficits on exam.  CT head demonstrates sinusitis.  Labs unremarkable.  Pt feeling better after migraine cocktail.  I discussed the patient, workup, and plan with Dr Vanita Panda who has also seen the patient.  Patient with remarkable relief following partial migraine cocktail.  Ambulates easily without dizziness/lightheadedness.  D/C home with intranasal steroid, decongestant, PCP follow up.  Pt to continue z-pak, last dose tomorrow.  Discussed result, findings, treatment, and follow up  with patient.  Pt given return precautions.  Pt verbalizes understanding and agrees with plan.        Clayton Bibles, PA-C 07/27/15 [place], MD 07/27/15 2226

## 2015-07-27 NOTE — ED Notes (Signed)
Pt able to ambulate in hall without dizziness.  Gait steady and even.

## 2015-07-27 NOTE — ED Notes (Signed)
Pt up to restroom.  Gait steady and even.   

## 2015-07-27 NOTE — ED Notes (Signed)
Per EMS: Pt presents to ED after having increased n/v, dizziness and headache.  Pt began having cough and congestion on Tuesday.  Spoke to her PCP on Thursday and began a z-pack.  Yesterday she began to have a headache (hx of migraines) which has increased today.  EMS states possible ischemia on 12-lead.  Pt axo, denies CP or SOB.

## 2015-07-27 NOTE — Discharge Instructions (Signed)
Read the information below.  Use the prescribed medication as directed.  Please discuss all new medications with your pharmacist.  You may return to the Emergency Department at any time for worsening condition or any new symptoms that concern you.      You are having a headache. Your headache today does not appear to be life-threatening or require hospitalization, but often the exact cause of headaches is not determined in the emergency department. Therefore, follow-up with your doctor is very important to find out what may have caused your headache, and whether or not you need any further diagnostic testing or treatment. Sometimes headaches can appear benign (not harmful), but then more serious symptoms can develop which should prompt an immediate re-evaluation by your doctor or the emergency department. SEEK MEDICAL ATTENTION IF: You develop possible problems with medications prescribed.  The medications don't resolve your headache, if it recurs , or if you have multiple episodes of vomiting or can't take fluids. You have a change from the usual headache. RETURN IMMEDIATELY IF you develop a sudden, severe headache or confusion, become poorly responsive or faint, develop a fever above 100.44F or problem breathing, have a change in speech, vision, swallowing, or understanding, or develop new weakness, numbness, tingling, incoordination, or have a seizure.      Sinus Headache A sinus headache occurs when the paranasal sinuses become clogged or swollen. Paranasal sinuses are air pockets within the bones of the face. Sinus headaches can range from mild to severe. CAUSES A sinus headache can result from various conditions that affect the sinuses, such as:  Colds.  Sinus infections.  Allergies. SYMPTOMS The main symptom of this condition is a headache that may feel like pain or pressure in the face, forehead, ears, or upper teeth. People who have a sinus headache often have other symptoms, such  as:  Congested or runny nose.  Fever.  Inability to smell. Weather changes can make symptoms worse. DIAGNOSIS This condition may be diagnosed based on:  A physical exam and medical history.  Imaging tests, such as a CT scan and MRI, to check for problems with the sinuses.  A specialist may look into the sinuses with a tool that has a camera (endoscopy). TREATMENT Treatment for this condition depends on the cause.  Sinus pain that is caused by a sinus infection may be treated with antibiotic medicine.  Sinus pain that is caused by allergies may be helped by allergy medicines (antihistamines) and medicated nasal sprays.  Sinus pain that is caused by congestion may be helped by flushing the nose and sinuses with saline solution. HOME CARE INSTRUCTIONS  Take medicines only as directed by your health care provider.  If you were prescribed an antibiotic medicine, finish all of it even if you start to feel better.  If you have congestion, use a nasal spray to help reduce pressure.  If directed, apply a warm, moist washcloth to your face to help relieve pain. SEEK MEDICAL CARE IF:  You have headaches more than one time each week.  You have sensitivity to light or sound.  You have a fever.  You feel sick to your stomach (nauseous) or you throw up (vomit).  Your headaches do not get better with treatment. Many people think that they have a sinus headache when they actually have migraines or tension headaches. SEEK IMMEDIATE MEDICAL CARE IF:  You have vision problems.  You have sudden, severe pain in your face or head.  You have a seizure.  You are confused.  You have a stiff neck.   This information is not intended to replace advice given to you by your health care provider. Make sure you discuss any questions you have with your health care provider.   Document Released: 04/29/2004 Document Revised: 08/06/2014 Document Reviewed: 03/18/2014 Elsevier Interactive Patient  Education 2016 [place] your condition for any changes. Take these steps to help with your condition: Managing Pain  Take over-the-counter and prescription medicines only as told by your health care provider.  Lie down in a dark, quiet room when you have a headache.  If directed, apply ice to the head and neck area:  Put ice in a plastic bag.  Place a towel between your skin and the bag.  Leave the ice on for 20 minutes, 2-3 times per day.  Use a heating pad or hot shower to apply heat to the head and neck area as told by your health care provider.  Keep lights dim if bright lights bother you or make your headaches worse. Eating and Drinking  Eat meals on a regular schedule.  Limit alcohol use.  Decrease the amount of caffeine you drink, or stop drinking caffeine. General Instructions  Keep all follow-up visits as told by your health care provider. This is important.  Keep a headache journal to help find out what may trigger your headaches. For example, write down:  What you eat and drink.  How much sleep you get.  Any change to your diet or medicines.  Try massage or other relaxation techniques.  Limit stress.  Sit up straight, and do not tense your muscles.  Do not use tobacco products, including cigarettes, chewing tobacco, or e-cigarettes. If you need help quitting, ask your health care provider.  Exercise regularly as told by your health care provider.  Sleep on a regular schedule. Get 7-9 hours of sleep, or the amount recommended by your health care provider. SEEK MEDICAL CARE IF:   Your symptoms are not helped by medicine.  You have a headache that is different from the usual headache.  You have nausea or you vomit.  You have a fever. SEEK IMMEDIATE MEDICAL CARE IF:   Your headache becomes severe.  You have repeated vomiting.  You have a stiff neck.  You have a loss of vision.  You have problems with  speech.  You have pain in the eye or ear.  You have muscular weakness or loss of muscle control.  You lose your balance or have trouble walking.  You feel faint or pass out.  You have confusion.   This information is not intended to replace advice given to you by your health care provider. Make sure you discuss any questions you have with your health care provider.   Document Released: 03/22/2005 Document Revised: 12/11/2014 Document Reviewed: 07/15/2014 Elsevier Interactive Patient Education [employer].

## 2015-07-27 NOTE — ED Notes (Signed)
PA at bedside updating patient/family 

## 2015-07-29 DIAGNOSIS — R05 Cough: Secondary | ICD-10-CM | POA: Diagnosis not present

## 2015-07-29 DIAGNOSIS — J328 Other chronic sinusitis: Secondary | ICD-10-CM | POA: Diagnosis not present

## 2015-07-29 DIAGNOSIS — Z6823 Body mass index (BMI) 23.0-23.9, adult: Secondary | ICD-10-CM | POA: Diagnosis not present

## 2015-07-31 DIAGNOSIS — Z79899 Other long term (current) drug therapy: Secondary | ICD-10-CM | POA: Diagnosis not present

## 2015-07-31 DIAGNOSIS — E038 Other specified hypothyroidism: Secondary | ICD-10-CM | POA: Diagnosis not present

## 2015-07-31 DIAGNOSIS — E538 Deficiency of other specified B group vitamins: Secondary | ICD-10-CM | POA: Diagnosis not present

## 2015-07-31 DIAGNOSIS — Z0000 Encounter for general adult medical examination without abnormal findings: Secondary | ICD-10-CM | POA: Diagnosis not present

## 2015-08-01 DIAGNOSIS — J018 Other acute sinusitis: Secondary | ICD-10-CM | POA: Diagnosis not present

## 2015-08-01 DIAGNOSIS — H903 Sensorineural hearing loss, bilateral: Secondary | ICD-10-CM | POA: Diagnosis not present

## 2015-08-07 DIAGNOSIS — Z1389 Encounter for screening for other disorder: Secondary | ICD-10-CM | POA: Diagnosis not present

## 2015-08-07 DIAGNOSIS — G4709 Other insomnia: Secondary | ICD-10-CM | POA: Diagnosis not present

## 2015-08-07 DIAGNOSIS — Z0000 Encounter for general adult medical examination without abnormal findings: Secondary | ICD-10-CM | POA: Diagnosis not present

## 2015-08-07 DIAGNOSIS — K219 Gastro-esophageal reflux disease without esophagitis: Secondary | ICD-10-CM | POA: Diagnosis not present

## 2015-08-07 DIAGNOSIS — Z6823 Body mass index (BMI) 23.0-23.9, adult: Secondary | ICD-10-CM | POA: Diagnosis not present

## 2015-08-07 DIAGNOSIS — F325 Major depressive disorder, single episode, in full remission: Secondary | ICD-10-CM | POA: Diagnosis not present

## 2015-08-07 DIAGNOSIS — E538 Deficiency of other specified B group vitamins: Secondary | ICD-10-CM | POA: Diagnosis not present

## 2015-08-07 DIAGNOSIS — M1990 Unspecified osteoarthritis, unspecified site: Secondary | ICD-10-CM | POA: Diagnosis not present

## 2015-08-07 DIAGNOSIS — R030 Elevated blood-pressure reading, without diagnosis of hypertension: Secondary | ICD-10-CM | POA: Diagnosis not present

## 2015-10-03 ENCOUNTER — Telehealth: Payer: Self-pay | Admitting: Internal Medicine

## 2015-10-03 NOTE — Telephone Encounter (Signed)
Dr. Hilarie Fredrickson will you accept patient?

## 2015-10-03 NOTE — Telephone Encounter (Signed)
Sure

## 2015-10-04 NOTE — Telephone Encounter (Signed)
ok 

## 2015-10-06 NOTE — Telephone Encounter (Signed)
I have called the patient and let her know that she has been accepted to be a patient of Dr. Vena Rua.

## 2015-10-09 DIAGNOSIS — D485 Neoplasm of uncertain behavior of skin: Secondary | ICD-10-CM | POA: Diagnosis not present

## 2015-10-09 DIAGNOSIS — D1801 Hemangioma of skin and subcutaneous tissue: Secondary | ICD-10-CM | POA: Diagnosis not present

## 2015-10-09 DIAGNOSIS — L720 Epidermal cyst: Secondary | ICD-10-CM | POA: Diagnosis not present

## 2015-10-09 DIAGNOSIS — Z85828 Personal history of other malignant neoplasm of skin: Secondary | ICD-10-CM | POA: Diagnosis not present

## 2015-11-14 DIAGNOSIS — M25551 Pain in right hip: Secondary | ICD-10-CM | POA: Diagnosis not present

## 2015-11-14 DIAGNOSIS — M1611 Unilateral primary osteoarthritis, right hip: Secondary | ICD-10-CM | POA: Diagnosis not present

## 2015-11-14 DIAGNOSIS — M1612 Unilateral primary osteoarthritis, left hip: Secondary | ICD-10-CM | POA: Diagnosis not present

## 2015-11-14 DIAGNOSIS — M7062 Trochanteric bursitis, left hip: Secondary | ICD-10-CM | POA: Diagnosis not present

## 2015-12-24 DIAGNOSIS — M7062 Trochanteric bursitis, left hip: Secondary | ICD-10-CM | POA: Diagnosis not present

## 2015-12-24 DIAGNOSIS — M7061 Trochanteric bursitis, right hip: Secondary | ICD-10-CM | POA: Diagnosis not present

## 2015-12-24 DIAGNOSIS — M25551 Pain in right hip: Secondary | ICD-10-CM | POA: Diagnosis not present

## 2016-01-08 DIAGNOSIS — H04123 Dry eye syndrome of bilateral lacrimal glands: Secondary | ICD-10-CM | POA: Diagnosis not present

## 2016-01-08 DIAGNOSIS — H16213 Exposure keratoconjunctivitis, bilateral: Secondary | ICD-10-CM | POA: Diagnosis not present

## 2016-01-08 DIAGNOSIS — H5712 Ocular pain, left eye: Secondary | ICD-10-CM | POA: Diagnosis not present

## 2016-01-22 DIAGNOSIS — J310 Chronic rhinitis: Secondary | ICD-10-CM | POA: Diagnosis not present

## 2016-01-22 DIAGNOSIS — H6991 Unspecified Eustachian tube disorder, right ear: Secondary | ICD-10-CM | POA: Diagnosis not present

## 2016-02-20 DIAGNOSIS — E538 Deficiency of other specified B group vitamins: Secondary | ICD-10-CM | POA: Diagnosis not present

## 2016-02-20 DIAGNOSIS — Z23 Encounter for immunization: Secondary | ICD-10-CM | POA: Diagnosis not present

## 2016-02-20 DIAGNOSIS — D6489 Other specified anemias: Secondary | ICD-10-CM | POA: Diagnosis not present

## 2016-02-20 DIAGNOSIS — D692 Other nonthrombocytopenic purpura: Secondary | ICD-10-CM | POA: Diagnosis not present

## 2016-02-20 DIAGNOSIS — Z6822 Body mass index (BMI) 22.0-22.9, adult: Secondary | ICD-10-CM | POA: Diagnosis not present

## 2016-02-24 DIAGNOSIS — E538 Deficiency of other specified B group vitamins: Secondary | ICD-10-CM | POA: Diagnosis not present

## 2016-03-22 DIAGNOSIS — H01002 Unspecified blepharitis right lower eyelid: Secondary | ICD-10-CM | POA: Diagnosis not present

## 2016-03-22 DIAGNOSIS — H18831 Recurrent erosion of cornea, right eye: Secondary | ICD-10-CM | POA: Diagnosis not present

## 2016-03-22 DIAGNOSIS — H04121 Dry eye syndrome of right lacrimal gland: Secondary | ICD-10-CM | POA: Diagnosis not present

## 2016-03-22 DIAGNOSIS — H01001 Unspecified blepharitis right upper eyelid: Secondary | ICD-10-CM | POA: Diagnosis not present

## 2016-04-15 DIAGNOSIS — R05 Cough: Secondary | ICD-10-CM | POA: Diagnosis not present

## 2016-04-15 DIAGNOSIS — J019 Acute sinusitis, unspecified: Secondary | ICD-10-CM | POA: Diagnosis not present

## 2016-04-15 DIAGNOSIS — Z6822 Body mass index (BMI) 22.0-22.9, adult: Secondary | ICD-10-CM | POA: Diagnosis not present

## 2016-06-02 ENCOUNTER — Telehealth: Payer: Self-pay | Admitting: Physician Assistant

## 2016-06-02 DIAGNOSIS — R300 Dysuria: Secondary | ICD-10-CM | POA: Diagnosis not present

## 2016-06-02 NOTE — Telephone Encounter (Signed)
Patient of Dr Vena Rua, former Dr.Patterson patient. She reports she has had constipation most all of her adult life. In the past month she has started having soft formed stools 4 to 5 times a day. She has had one spell of diarrhea. She denies any pain or bloody stools. Her bowel movements are explosive and urgent. She has a lot of gas. She is also experiencing urinary urgency which she is calling her PCP about today.

## 2016-06-02 NOTE — Telephone Encounter (Signed)
Patient's appointment moved to 06/07/16 at 3 pm. Patient accept the appointment.

## 2016-06-07 ENCOUNTER — Encounter: Payer: Self-pay | Admitting: Internal Medicine

## 2016-06-07 ENCOUNTER — Ambulatory Visit (INDEPENDENT_AMBULATORY_CARE_PROVIDER_SITE_OTHER): Payer: Medicare HMO | Admitting: Internal Medicine

## 2016-06-07 ENCOUNTER — Other Ambulatory Visit: Payer: Medicare HMO

## 2016-06-07 VITALS — BP 160/100 | HR 80 | Ht 68.0 in | Wt 149.0 lb

## 2016-06-07 DIAGNOSIS — Z800 Family history of malignant neoplasm of digestive organs: Secondary | ICD-10-CM | POA: Diagnosis not present

## 2016-06-07 DIAGNOSIS — K219 Gastro-esophageal reflux disease without esophagitis: Secondary | ICD-10-CM | POA: Diagnosis not present

## 2016-06-07 DIAGNOSIS — R194 Change in bowel habit: Secondary | ICD-10-CM

## 2016-06-07 MED ORDER — NA SULFATE-K SULFATE-MG SULF 17.5-3.13-1.6 GM/177ML PO SOLN: ORAL | 0 refills | Status: DC

## 2016-06-07 NOTE — Patient Instructions (Addendum)
You have been scheduled for a colonoscopy. Please follow written instructions given to you at your visit today.  Please pick up your prep supplies at the pharmacy within the next 1-3 days. If you use inhalers (even only as needed), please bring them with you on the day of your procedure. Your physician has requested that you go to www.startemmi.com and enter the access code given to you at your visit today. This web site gives a general overview about your procedure. However, you should still follow specific instructions given to you by our office regarding your preparation for the procedure.  Your physician has requested that you go to the basement for the following lab work before leaving today: GI Pathogen panel  If you are age 66 or older, your body mass index should be between 23-30. Your Body mass index is 22.66 kg/m. If this is out of the aforementioned range listed, please consider follow up with your Primary Care Provider.  If you are age 70 or younger, your body mass index should be between 19-25. Your Body mass index is 22.66 kg/m. If this is out of the aformentioned range listed, please consider follow up with your Primary Care Provider.

## 2016-06-07 NOTE — Progress Notes (Signed)
Patient ID: Pamela Snow, female   DOB: [birth year], 76 y.o.   MRN: [id number] HPI: Pamela Snow is a 76 year old female with a past medical history of diverticulosis and diverticulitis status post sigmoid resection, history of colon polyps, family history of colon cancer in her mother and sister, GERD who is seen to evaluate change in bowel habit. She was managed by Dr. Sharlett Iles prior to his retirement and then saw Dr. Henrene Pastor on a few occasions.  Recently she reports that she's had a change in bowel habit. She has a long-standing history of constipation for which she used MiraLAX. MiraLAX had been very helpful for her. Over the last 2-3 months she's noticed a change in her bowel habit with more narrow stool, abdominal bloating as well as explosive stool. She's had diarrhea for the last several days but this has not been a predominant complaint. She has noticed more frequent urinary tract infection and was started on UTI treatment today by primary care. She denies seeing blood in her stool or melena. She has had abdominal discomfort with bloating but not true pain. She has felt some incomplete evacuation along with her never were stools. No recent change in medication. She takes pantoprazole which works great for her reflux. No dysphagia or odynophagia.  She had a diagnostic upper endoscopy on 11/07/2014 which was normal.  Her last colonoscopy was performed on 05/15/2012 by Dr. Sharlett Iles. This showed a redundant colon, surgical anastomosis at 15 cm and was otherwise unremarkable.  Past Medical History:  Diagnosis Date  . Adenomatous colon polyp   . Arthritis   . B12 deficiency   . Chronic constipation   . Diverticulitis   . Diverticulosis    hx of diverticulitis  . Gall stones   . Generalized headaches    states never had until trouble with parathyroidism  . GERD (gastroesophageal reflux disease)   . Hiatal hernia   . Hx of cardiac catheterization    a. LHC (10/2013):  normal coronary  arteries, EF 65%   . Hyperparathyroidism   . Hypertension   . Kidney stones   . Symptomatic cholelithiasis 05/26/2012   s/p cholecystectomy    Past Surgical History:  Procedure Laterality Date  . ABDOMINAL HYSTERECTOMY  2002  . CHOLECYSTECTOMY N/A 05/25/2012   Procedure: LAPAROSCOPIC CHOLECYSTECTOMY WITH INTRAOPERATIVE CHOLANGIOGRAM;  Surgeon: Leighton Ruff, MD;  Location: [facility];  Service: General;  Laterality: N/A;  unable to come before 12  . JOINT REPLACEMENT     bilateral  . LAPAROSCOPIC COLON RESECTION  04/2011   [facility];diverticullitis  . LEFT HEART CATHETERIZATION WITH CORONARY ANGIOGRAM N/A 10/12/2013   Procedure: LEFT HEART CATHETERIZATION WITH CORONARY ANGIOGRAM;  Surgeon: Sinclair Grooms, MD;  Location: [facility];  Service: Cardiovascular;  Laterality: N/A;  . PARATHYROIDECTOMY  03/12/2011   Procedure: PARATHYROIDECTOMY;  Surgeon: Earnstine Regal, MD;  Location: [facility];  Service: General;  Laterality: Left;  Left superior parathyroidectomy, minimally invasive   . TONSILLECTOMY    . TOTAL KNEE ARTHROPLASTY  2005   bilateral  . VAGINAL HYSTERECTOMY      Outpatient Medications Prior to Visit  Medication Sig Dispense Refill  . ALPRAZolam (XANAX) 0.5 MG tablet Take 0.5 mg by mouth daily as needed for anxiety.    . cyanocobalamin (,VITAMIN B-12,) 1000 MCG/ML injection Inject 1,000 mcg into the muscle every 30 (thirty) days. Inject 1 ml into a muscle once weekly for 3 weeks, then inject 1 ml into muscle monthly for a year.    Marland Kitchen  Eszopiclone (ESZOPICLONE) 3 MG TABS Take 3 mg by mouth at bedtime as needed (for sleep). Take immediately before bedtime    . Eszopiclone (LUNESTA PO) Take by mouth.    . fluticasone (FLONASE) 50 MCG/ACT nasal spray Place 2 sprays into both nostrils daily. 16 g 0  . HYDROcodone-acetaminophen (NORCO) 5-325 MG tablet Take 1 tablet by mouth every 6 (six) hours as needed. 15 tablet 0  . levothyroxine (SYNTHROID, LEVOTHROID) 50 MCG tablet Take 50 mcg  by mouth every morning.     . polyethylene glycol powder (GLYCOLAX/MIRALAX) powder Take 34 g by mouth daily.    . pseudoephedrine (SUDAFED) 60 MG tablet Take 1 tablet (60 mg total) by mouth every 6 (six) hours. For nasal congestion/sinusitis 30 tablet 0  . sucralfate (CARAFATE) 1 g tablet Take 1 gram between meals and at bedtime. 90 tablet 0  . Temazepam (RESTORIL PO) Take 1 tablet by mouth at bedtime as needed (for sleep).     . topiramate (TOPAMAX) 25 MG capsule Take 75 mg by mouth at bedtime.     . verapamil (CALAN) 120 MG tablet Take 120 mg by mouth 2 (two) times daily.     Marland Kitchen aspirin EC 325 MG tablet Take 325 mg by mouth daily.    Marland Kitchen buPROPion (WELLBUTRIN SR) 150 MG 12 hr tablet Take 150 mg by mouth every morning.    Marland Kitchen esomeprazole (NEXIUM) 40 MG capsule Take one capsule twice daily (30 minutes before eating) for one week.  Then, resume one capsule daily. (Patient not taking: Reported on 06/07/2016)    . famotidine (PEPCID) 20 MG tablet Take 40 mg by mouth daily.     Facility-Administered Medications Prior to Visit  Medication Dose Route Frequency Provider Last Rate Last Dose  . cyanocobalamin ((VITAMIN B-12)) injection 1,000 mcg  1,000 mcg Intramuscular Q30 days Jerene Bears, MD   1,000 mcg at 05/09/14 1023    Allergies  Allergen Reactions  . Ciprofloxacin     Serum sickness    REQUIRING HOSPITALIZATION  . Flagyl [Metronidazole Hcl]     Serum sickness/   REQUIRING HOSPITALIZATION  . Scopolamine     Pt states she becomes very confused and has vertigo with scopolamine patch  . Azactam [Aztreonam] Hives  . Clindamycin/Lincomycin     hives  . Penicillins Hives and Other (See Comments)    Thrush  . Bactrim [Sulfamethoxazole-Trimethoprim]     swollen tongue  . Moviprep [Peg-Kcl-Nacl-Nasulf-Na Asc-C] Other (See Comments)    Pt's abdomen swells with the prep and she never "processes" the solution  . Scallops [Shellfish Allergy] Nausea And Vomiting    Family History  Problem Relation  Age of Onset  . Colon cancer Mother   . Heart failure Mother   . Cancer Mother     colon  . Colon cancer Sister   . Cancer Sister     colon  . Heart disease Father     heart attack following surgery  . Esophageal cancer Neg Hx   . Rectal cancer Neg Hx   . Stomach cancer Neg Hx     Social History  Substance Use Topics  . Smoking status: Former Smoker    Types: Cigarettes    Quit date: 04/05/2000  . Smokeless tobacco: Never Used     Comment: quit smoking 10 yrs ago  . Alcohol use 0.0 oz/week     Comment: weekends and occasional cocktail    ROS: As per history of present illness, otherwise negative  BP (!) 160/100   Pulse 80   Ht 5\' 8"  (1.727 m)   Wt 149 lb (67.6 kg)   BMI 22.66 kg/m  Constitutional: Well-developed and well-nourished. No distress. HEENT: Normocephalic and atraumatic.  Conjunctivae are normal.  No scleral icterus. Neck: Neck supple. Trachea midline. Cardiovascular: Normal rate, regular rhythm and intact distal pulses. No M/R/G Pulmonary/chest: Effort normal and breath sounds normal. No wheezing, rales or rhonchi. Abdominal: Soft, nontender, nondistended. Bowel sounds active throughout. There are no masses palpable. No hepatosplenomegaly. Extremities: no clubbing, cyanosis, or edema Neurological: Alert and oriented to person place and time. Skin: Skin is warm and dry.  Psychiatric: Normal mood and affect. Behavior is normal.  RELEVANT LABS AND IMAGING: CBC    Component Value Date/Time   WBC 4.4 07/27/2015 1700   RBC 4.41 07/27/2015 1700   HGB 13.7 07/27/2015 1700   HCT 40.0 07/27/2015 1700   PLT 244 07/27/2015 1700   MCV 90.7 07/27/2015 1700   MCH 31.1 07/27/2015 1700   MCHC 34.3 07/27/2015 1700   RDW 11.8 07/27/2015 1700   LYMPHSABS 0.7 07/27/2015 1700   MONOABS 0.4 07/27/2015 1700   EOSABS 0.0 07/27/2015 1700   BASOSABS 0.0 07/27/2015 1700    CMP     Component Value Date/Time   NA 136 07/27/2015 1700   K 3.5 07/27/2015 1700   CL 105  07/27/2015 1700   CO2 19 (L) 07/27/2015 1700   GLUCOSE 108 (H) 07/27/2015 1700   BUN 16 07/27/2015 1700   CREATININE 0.94 07/27/2015 1700   CALCIUM 9.2 07/27/2015 1700   CALCIUM 10.5 11/06/2010 1033   PROT 6.8 02/20/2013 1629   ALBUMIN 4.2 02/20/2013 1629   AST 17 02/20/2013 1629   ALT 15 02/20/2013 1629   ALKPHOS 31 (L) 02/20/2013 1629   BILITOT 0.8 02/20/2013 1629   GFRNONAA 59 (L) 07/27/2015 1700   GFRAA >60 07/27/2015 1700    ASSESSMENT/PLAN: 76 year old female with a past medical history of diverticulosis and diverticulitis status post sigmoid resection, history of colon polyps, family history of colon cancer in her mother and sister, GERD who is seen to evaluate change in bowel habit  1. Change in bowel habit/history of chronic constipation/family history of colon cancer in mother and sister -- she is concerned about her symptoms and I have tried to provide reassurance that this is very unlikely to be colon cancer. Given her family history I understand her concern. I recommended a GI pathogen panel to exclude parasitic infection such as Giardia. Abdominal exam is benign and so very unlikely to be diverticulitis. If infectious stool panel negative I recommended proceeding to colonoscopy. We discussed the risk, benefit and alternatives and she wishes to proceed.  2. GERD -- unremarkable EGD in 2016. While control with PPI. Continue current dose. No alarm symptoms.  25 minutes spent with the patient today. Greater than 50% was spent in counseling and coordination of care with the patient     [id number] [address] [place], [place] [address]

## 2016-06-08 ENCOUNTER — Other Ambulatory Visit: Payer: Medicare HMO

## 2016-06-08 DIAGNOSIS — R194 Change in bowel habit: Secondary | ICD-10-CM

## 2016-06-09 LAB — GASTROINTESTINAL PATHOGEN PANEL PCR
C. difficile Tox A/B, PCR: NOT DETECTED
Campylobacter, PCR: NOT DETECTED
Cryptosporidium, PCR: NOT DETECTED
E coli (ETEC) LT/ST PCR: NOT DETECTED
E coli (STEC) stx1/stx2, PCR: NOT DETECTED
E coli 0157, PCR: NOT DETECTED
Giardia lamblia, PCR: NOT DETECTED
Norovirus, PCR: NOT DETECTED
Rotavirus A, PCR: NOT DETECTED
Salmonella, PCR: NOT DETECTED
Shigella, PCR: NOT DETECTED

## 2016-06-10 ENCOUNTER — Ambulatory Visit: Payer: Medicare Other | Admitting: Gastroenterology

## 2016-06-14 ENCOUNTER — Ambulatory Visit: Payer: Medicare Other | Admitting: Physician Assistant

## 2016-06-29 ENCOUNTER — Encounter: Payer: Self-pay | Admitting: Internal Medicine

## 2016-07-12 ENCOUNTER — Ambulatory Visit: Payer: Medicare Other | Admitting: Internal Medicine

## 2016-07-13 ENCOUNTER — Encounter: Payer: Self-pay | Admitting: Internal Medicine

## 2016-07-13 ENCOUNTER — Ambulatory Visit (AMBULATORY_SURGERY_CENTER): Payer: Medicare HMO | Admitting: Internal Medicine

## 2016-07-13 VITALS — BP 152/77 | HR 67 | Temp 97.7°F | Resp 14 | Ht 68.0 in | Wt 149.0 lb

## 2016-07-13 DIAGNOSIS — Z800 Family history of malignant neoplasm of digestive organs: Secondary | ICD-10-CM

## 2016-07-13 DIAGNOSIS — K449 Diaphragmatic hernia without obstruction or gangrene: Secondary | ICD-10-CM | POA: Diagnosis not present

## 2016-07-13 DIAGNOSIS — D124 Benign neoplasm of descending colon: Secondary | ICD-10-CM | POA: Diagnosis not present

## 2016-07-13 DIAGNOSIS — D120 Benign neoplasm of cecum: Secondary | ICD-10-CM | POA: Diagnosis not present

## 2016-07-13 DIAGNOSIS — I10 Essential (primary) hypertension: Secondary | ICD-10-CM | POA: Diagnosis not present

## 2016-07-13 DIAGNOSIS — D123 Benign neoplasm of transverse colon: Secondary | ICD-10-CM | POA: Diagnosis not present

## 2016-07-13 DIAGNOSIS — D122 Benign neoplasm of ascending colon: Secondary | ICD-10-CM

## 2016-07-13 DIAGNOSIS — R194 Change in bowel habit: Secondary | ICD-10-CM

## 2016-07-13 DIAGNOSIS — K219 Gastro-esophageal reflux disease without esophagitis: Secondary | ICD-10-CM | POA: Diagnosis not present

## 2016-07-13 MED ORDER — SODIUM CHLORIDE 0.9 % IV SOLN: 500.0000 mL | INTRAVENOUS | Status: DC

## 2016-07-13 NOTE — Progress Notes (Signed)
Report to PACU, RN, vss, BBS= Clear.  

## 2016-07-13 NOTE — Patient Instructions (Signed)
YOU HAD AN ENDOSCOPIC PROCEDURE TODAY AT THE [facility]:   Refer to the procedure report that was given to you for any specific questions about what was found during the examination.  If the procedure report does not answer your questions, please call your gastroenterologist to clarify.  If you requested that your care partner not be given the details of your procedure findings, then the procedure report has been included in a sealed envelope for you to review at your convenience later.  YOU SHOULD EXPECT: Some feelings of bloating in the abdomen. Passage of more gas than usual.  Walking can help get rid of the air that was put into your GI tract during the procedure and reduce the bloating. If you had a lower endoscopy (such as a colonoscopy or flexible sigmoidoscopy) you may notice spotting of blood in your stool or on the toilet paper. If you underwent a bowel prep for your procedure, you may not have a normal bowel movement for a few days.  Please Note:  You might notice some irritation and congestion in your nose or some drainage.  This is from the oxygen used during your procedure.  There is no need for concern and it should clear up in a day or so.  SYMPTOMS TO REPORT IMMEDIATELY:   Following lower endoscopy (colonoscopy or flexible sigmoidoscopy):  Excessive amounts of blood in the stool  Significant tenderness or worsening of abdominal pains  Swelling of the abdomen that is new, acute  Fever of 100F or higher  For urgent or emergent issues, a gastroenterologist can be reached at any hour by calling [phone].  DIET:  We do recommend a small meal at first, but then you may proceed to your regular diet.  Drink plenty of fluids but you should avoid alcoholic beverages for 24 hours.  ACTIVITY:  You should plan to take it easy for the rest of today and you should NOT DRIVE or use heavy machinery until tomorrow (because of the sedation medicines used during the test).     FOLLOW UP: Our staff will call the number listed on your records the next business day following your procedure to check on you and address any questions or concerns that you may have regarding the information given to you following your procedure. If we do not reach you, we will leave a message.  However, if you are feeling well and you are not experiencing any problems, there is no need to return our call.  We will assume that you have returned to your regular daily activities without incident.  If any biopsies were taken you will be contacted by phone or by letter within the next 1-3 weeks.  Please call us at [phone] if you have not heard about the biopsies in 3 weeks.   SIGNATURES/CONFIDENTIALITY: You and/or your care partner have signed paperwork which will be entered into your electronic medical record.  These signatures attest to the fact that that the information above on your After Visit Summary has been reviewed and is understood.  Full responsibility of the confidentiality of this discharge information lies with you and/or your care-partner.  Await pathology  Please read over handouts about polyps, diverticulosis, hemorrhoids and high fiber diets  Continue your normal medications 

## 2016-07-13 NOTE — Op Note (Signed)
[facility] Patient Name: Pamela Snow Procedure Date: 07/13/2016 2:26 PM MRN: [id number] Endoscopist: Jerene Bears , MD Age: 76 Referring MD:  Date of Birth: [birth year] Gender: Female Account #: [id number] Procedure:                Colonoscopy Indications:              Last colonoscopy: February 2014, Family history of                            colon cancer in multiple first-degree relatives,                            Change in bowel habits Medicines:                Monitored Anesthesia Care Procedure:                Pre-Anesthesia Assessment:                           - Prior to the procedure, a History and Physical                            was performed, and patient medications and                            allergies were reviewed. The patient's tolerance of                            previous anesthesia was also reviewed. The risks                            and benefits of the procedure and the sedation                            options and risks were discussed with the patient.                            All questions were answered, and informed consent                            was obtained. Prior Anticoagulants: The patient has                            taken no previous anticoagulant or antiplatelet                            agents. ASA Grade Assessment: III - A patient with                            severe systemic disease. After reviewing the risks                            and benefits, the patient was deemed in  satisfactory condition to undergo the procedure.                           After obtaining informed consent, the colonoscope                            was passed under direct vision. Throughout the                            procedure, the patient's blood pressure, pulse, and                            oxygen saturations were monitored continuously. The                            Model PCF-[id number] scope  was introduced                            through the anus and advanced to the the cecum,                            identified by appendiceal orifice and ileocecal                            valve. The colonoscopy was performed without                            difficulty. The patient tolerated the procedure                            well. The quality of the bowel preparation was                            good. The ileocecal valve, appendiceal orifice, and                            rectum were photographed. Scope In: 2:40:11 PM Scope Out: 3:05:36 PM Scope Withdrawal Time: 0 hours 18 minutes 36 seconds  Total Procedure Duration: 0 hours 25 minutes 25 seconds  Findings:                 The digital rectal exam was normal.                           A 5 mm polyp was found in the cecum. The polyp was                            sessile. The polyp was removed with a cold snare.                            Resection and retrieval were complete.                           Three sessile polyps were found in the cecum. The  polyps were 2 to 3 mm in size. These polyps were                            removed with a cold biopsy forceps. Resection and                            retrieval were complete.                           A 5 mm polyp was found in the ascending colon. The                            polyp was sessile. The polyp was removed with a                            cold snare. Resection and retrieval were complete.                           Three sessile polyps were found in the transverse                            colon. The polyps were 4 to 5 mm in size. These                            polyps were removed with a cold snare. Resection                            and retrieval were complete.                           A 4 mm polyp was found in the descending colon. The                            polyp was sessile. The polyp was removed with a                             cold snare. Resection and retrieval were complete.                           Scattered small-mouthed diverticula were found in                            the descending colon, hepatic flexure and ascending                            colon.                           There was evidence of a prior end-to-end                            colo-colonic anastomosis in the distal sigmoid  colon. There is angulation associated with the                            anastomosis, though it is widely patent and was                            characterized by healthy appearing mucosa and an                            intact staple line. The anastomosis was traversed.                           Internal hemorrhoids were found during                            retroflexion. The hemorrhoids were small. Complications:            No immediate complications. Estimated Blood Loss:     Estimated blood loss was minimal. Impression:               - One 5 mm polyp in the cecum, removed with a cold                            snare. Resected and retrieved.                           - Three 2 to 3 mm polyps in the cecum, removed with                            a cold biopsy forceps. Resected and retrieved.                           - One 5 mm polyp in the ascending colon, removed                            with a cold snare. Resected and retrieved.                           - Three 4 to 5 mm polyps in the transverse colon,                            removed with a cold snare. Resected and retrieved.                           - One 4 mm polyp in the descending colon, removed                            with a cold snare. Resected and retrieved.                           - Mild diverticulosis in the descending colon, at  the hepatic flexure and in the ascending colon.                           - Patent end-to-end colo-colonic anastomosis,                             characterized by healthy appearing mucosa and an                            intact staple line.                           - Small iInternal hemorrhoids. Recommendation:           - Patient has a contact number available for                            emergencies. The signs and symptoms of potential                            delayed complications were discussed with the                            patient. Return to normal activities tomorrow.                            Written discharge instructions were provided to the                            patient.                           - Resume previous diet.                           - Continue present medications.                           - Await pathology results.                           - Repeat colonoscopy for surveillance based on                            pathology results. Jerene Bears, MD 07/13/2016 3:14:56 PM This report has been signed electronically.

## 2016-07-13 NOTE — Progress Notes (Signed)
Called to room to assist during endoscopic procedure.  Patient ID and intended procedure confirmed with present staff. Received instructions for my participation in the procedure from the performing physician.  

## 2016-07-14 ENCOUNTER — Telehealth: Payer: Self-pay | Admitting: *Deleted

## 2016-07-14 ENCOUNTER — Telehealth: Payer: Self-pay

## 2016-07-14 MED ORDER — RIFAXIMIN 550 MG PO TABS: 550.0000 mg | ORAL_TABLET | Freq: Three times a day (TID) | ORAL | 0 refills | Status: DC

## 2016-07-14 NOTE — Telephone Encounter (Signed)
-----   Message from Jerene Bears, MD sent at 07/13/2016  3:24 PM EDT ----- Regarding: Rx Rifaximin 550 mg TID x 14 days Bloating, change in bowel habits, explosive stools (IBS) Please send in to her pharmacy She is asked to call 1 week after treatment with update on if it helped [facility]

## 2016-07-14 NOTE — Telephone Encounter (Signed)
  Follow up Call-  Call back number 07/13/2016 11/07/2014  Post procedure Call Back phone  # [phone] hm  Permission to leave phone message Yes Yes  Some recent data might be hidden     Patient questions:  Do you have a fever, pain , or abdominal swelling? No. Pain Score  0 *  Have you tolerated food without any problems? Yes.    Have you been able to return to your normal activities? Yes.    Do you have any questions about your discharge instructions: Diet   No. Medications  No. Follow up visit  No.  Do you have questions or concerns about your Care? No.  Actions: * If pain score is 4 or above: No action needed, pain <4.

## 2016-07-14 NOTE — Telephone Encounter (Signed)
Xifaxan sent to [facility] for patient. Patient was already advised to call us in 1 week with an update on condition after her xifaxan course has been completed.

## 2016-07-16 ENCOUNTER — Telehealth: Payer: Self-pay | Admitting: Internal Medicine

## 2016-07-19 NOTE — Telephone Encounter (Signed)
Patient has been advised that a prior authorization was completed on Friday evening for this medication bringing the amount for the medication down to $400.00. Patient states that she has actually already purchased the medication and will began taking it.

## 2016-07-19 NOTE — Telephone Encounter (Signed)
Prior Pamela Snow was completed on Friday bringing cost down to $400.00. Dr Hilarie Fredrickson, would you like patient to try something else?

## 2016-07-19 NOTE — Telephone Encounter (Signed)
Please check with her as this cost may be acceptable to her Multiple antibiotic allergies will make selection of an alternative difficult

## 2016-07-20 ENCOUNTER — Encounter: Payer: Self-pay | Admitting: Internal Medicine

## 2016-07-23 DIAGNOSIS — E538 Deficiency of other specified B group vitamins: Secondary | ICD-10-CM | POA: Diagnosis not present

## 2016-08-05 DIAGNOSIS — E038 Other specified hypothyroidism: Secondary | ICD-10-CM | POA: Diagnosis not present

## 2016-08-05 DIAGNOSIS — E538 Deficiency of other specified B group vitamins: Secondary | ICD-10-CM | POA: Diagnosis not present

## 2016-08-05 DIAGNOSIS — Z0000 Encounter for general adult medical examination without abnormal findings: Secondary | ICD-10-CM | POA: Diagnosis not present

## 2016-08-12 DIAGNOSIS — R5383 Other fatigue: Secondary | ICD-10-CM | POA: Diagnosis not present

## 2016-08-12 DIAGNOSIS — R69 Illness, unspecified: Secondary | ICD-10-CM | POA: Diagnosis not present

## 2016-08-12 DIAGNOSIS — E210 Primary hyperparathyroidism: Secondary | ICD-10-CM | POA: Diagnosis not present

## 2016-08-12 DIAGNOSIS — Z880 Allergy status to penicillin: Secondary | ICD-10-CM | POA: Diagnosis not present

## 2016-08-12 DIAGNOSIS — E038 Other specified hypothyroidism: Secondary | ICD-10-CM | POA: Diagnosis not present

## 2016-08-12 DIAGNOSIS — E538 Deficiency of other specified B group vitamins: Secondary | ICD-10-CM | POA: Diagnosis not present

## 2016-08-12 DIAGNOSIS — D649 Anemia, unspecified: Secondary | ICD-10-CM | POA: Diagnosis not present

## 2016-08-12 DIAGNOSIS — D692 Other nonthrombocytopenic purpura: Secondary | ICD-10-CM | POA: Diagnosis not present

## 2016-08-12 DIAGNOSIS — Z0000 Encounter for general adult medical examination without abnormal findings: Secondary | ICD-10-CM | POA: Diagnosis not present

## 2016-08-12 DIAGNOSIS — R030 Elevated blood-pressure reading, without diagnosis of hypertension: Secondary | ICD-10-CM | POA: Diagnosis not present

## 2016-08-19 ENCOUNTER — Telehealth: Payer: Self-pay | Admitting: Internal Medicine

## 2016-08-19 NOTE — Telephone Encounter (Signed)
Dr. Pyrtle notified. 

## 2016-08-23 DIAGNOSIS — D485 Neoplasm of uncertain behavior of skin: Secondary | ICD-10-CM | POA: Diagnosis not present

## 2016-08-23 DIAGNOSIS — C44311 Basal cell carcinoma of skin of nose: Secondary | ICD-10-CM | POA: Diagnosis not present

## 2016-08-23 DIAGNOSIS — C44319 Basal cell carcinoma of skin of other parts of face: Secondary | ICD-10-CM | POA: Diagnosis not present

## 2016-08-23 DIAGNOSIS — L720 Epidermal cyst: Secondary | ICD-10-CM | POA: Diagnosis not present

## 2016-08-23 DIAGNOSIS — Z85828 Personal history of other malignant neoplasm of skin: Secondary | ICD-10-CM | POA: Diagnosis not present

## 2016-09-14 DIAGNOSIS — Z85828 Personal history of other malignant neoplasm of skin: Secondary | ICD-10-CM | POA: Diagnosis not present

## 2016-09-14 DIAGNOSIS — C4401 Basal cell carcinoma of skin of lip: Secondary | ICD-10-CM | POA: Diagnosis not present

## 2016-12-01 DIAGNOSIS — R300 Dysuria: Secondary | ICD-10-CM | POA: Diagnosis not present

## 2016-12-01 DIAGNOSIS — N390 Urinary tract infection, site not specified: Secondary | ICD-10-CM | POA: Diagnosis not present

## 2016-12-13 DIAGNOSIS — R3129 Other microscopic hematuria: Secondary | ICD-10-CM | POA: Diagnosis not present

## 2016-12-15 DIAGNOSIS — M25561 Pain in right knee: Secondary | ICD-10-CM | POA: Diagnosis not present

## 2016-12-15 DIAGNOSIS — Z471 Aftercare following joint replacement surgery: Secondary | ICD-10-CM | POA: Diagnosis not present

## 2016-12-15 DIAGNOSIS — Z96651 Presence of right artificial knee joint: Secondary | ICD-10-CM | POA: Diagnosis not present
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
## 2016-12-29 ENCOUNTER — Encounter: Payer: Self-pay | Admitting: Family Medicine

## 2016-12-29 ENCOUNTER — Ambulatory Visit: Payer: Self-pay

## 2016-12-29 ENCOUNTER — Ambulatory Visit (INDEPENDENT_AMBULATORY_CARE_PROVIDER_SITE_OTHER): Payer: Medicare HMO | Admitting: Family Medicine

## 2016-12-29 VITALS — BP 140/86 | HR 70 | Ht 68.0 in | Wt 151.0 lb

## 2016-12-29 DIAGNOSIS — L02419 Cutaneous abscess of limb, unspecified: Secondary | ICD-10-CM

## 2016-12-29 DIAGNOSIS — L03119 Cellulitis of unspecified part of limb: Secondary | ICD-10-CM | POA: Diagnosis not present

## 2016-12-29 DIAGNOSIS — S80251A Superficial foreign body, right knee, initial encounter: Secondary | ICD-10-CM | POA: Diagnosis not present

## 2016-12-29 DIAGNOSIS — S80259A Superficial foreign body, unspecified knee, initial encounter: Secondary | ICD-10-CM | POA: Insufficient documentation

## 2016-12-29 DIAGNOSIS — M25561 Pain in right knee: Secondary | ICD-10-CM | POA: Diagnosis not present

## 2016-12-29 MED ORDER — DOXYCYCLINE HYCLATE 100 MG PO TABS: 100.0000 mg | ORAL_TABLET | Freq: Two times a day (BID) | ORAL | 0 refills | Status: AC

## 2016-12-29 NOTE — Patient Instructions (Signed)
   Good to see you  Rock noted under the skin and will take some time.  Heat 10 minutes 2 times a day  Consider wart remover cream to breakdown skin a little bit.  Doxycycline 2 times a day for 1 week.  Thigh compression sleeve with a lot of walking.  See me again in 3 weeks if not completely gone.

## 2016-12-29 NOTE — Assessment & Plan Note (Signed)
Patient does have a mild cellulitis noted. Started on doxycycline. I do believe that there is a foreign body but with patient having a knee replacement and do not feel that cutting and I would be beneficial. Patient is in agreement with the plan. Patient will do some things and hopefully that body will work the foreign body out. Discussed icing regimen, we discussed heat, topical anti-inflammatories. Patient will monitor. I do not feel any instability of the knee at this time. Following up again in 2-3 weeks.

## 2017-02-03 ENCOUNTER — Other Ambulatory Visit: Payer: Self-pay | Admitting: Urology

## 2017-02-03 DIAGNOSIS — R310 Gross hematuria: Secondary | ICD-10-CM

## 2017-02-03 DIAGNOSIS — N390 Urinary tract infection, site not specified: Secondary | ICD-10-CM | POA: Diagnosis not present

## 2017-02-04 ENCOUNTER — Ambulatory Visit
Admission: RE | Admit: 2017-02-04 | Discharge: 2017-02-04 | Disposition: A | Discharge status: Home / Self Care | Payer: Medicare HMO | Source: Ambulatory Visit | Attending: Urology | Admitting: Urology

## 2017-02-04 DIAGNOSIS — R310 Gross hematuria: Secondary | ICD-10-CM

## 2017-02-04 DIAGNOSIS — N3000 Acute cystitis without hematuria: Secondary | ICD-10-CM | POA: Diagnosis not present

## 2017-02-07 ENCOUNTER — Other Ambulatory Visit: Payer: Self-pay | Admitting: Urology

## 2017-02-07 DIAGNOSIS — R310 Gross hematuria: Secondary | ICD-10-CM

## 2017-02-08 DIAGNOSIS — Z23 Encounter for immunization: Secondary | ICD-10-CM | POA: Diagnosis not present

## 2017-02-08 DIAGNOSIS — Z13820 Encounter for screening for osteoporosis: Secondary | ICD-10-CM | POA: Diagnosis not present

## 2017-02-10 ENCOUNTER — Ambulatory Visit
Admission: RE | Admit: 2017-02-10 | Discharge: 2017-02-10 | Disposition: A | Discharge status: Home / Self Care | Payer: Medicare HMO | Source: Ambulatory Visit | Attending: Urology | Admitting: Urology

## 2017-02-10 DIAGNOSIS — N281 Cyst of kidney, acquired: Secondary | ICD-10-CM | POA: Diagnosis not present

## 2017-02-10 DIAGNOSIS — R310 Gross hematuria: Secondary | ICD-10-CM

## 2017-02-10 MED ORDER — IOPAMIDOL (ISOVUE-300) INJECTION 61%
125.0000 mL | Freq: Once | INTRAVENOUS | Status: AC | PRN
  Administered 2017-02-10: 125 mL via INTRAVENOUS

## 2017-02-14 DIAGNOSIS — R69 Illness, unspecified: Secondary | ICD-10-CM | POA: Diagnosis not present

## 2017-02-25 DIAGNOSIS — R1084 Generalized abdominal pain: Secondary | ICD-10-CM | POA: Diagnosis not present

## 2017-02-25 DIAGNOSIS — Z6821 Body mass index (BMI) 21.0-21.9, adult: Secondary | ICD-10-CM | POA: Diagnosis not present

## 2017-02-25 DIAGNOSIS — K5909 Other constipation: Secondary | ICD-10-CM | POA: Diagnosis not present

## 2017-03-03 DIAGNOSIS — R319 Hematuria, unspecified: Secondary | ICD-10-CM | POA: Diagnosis not present

## 2017-03-03 DIAGNOSIS — N390 Urinary tract infection, site not specified: Secondary | ICD-10-CM | POA: Diagnosis not present

## 2017-04-01 DIAGNOSIS — Z6820 Body mass index (BMI) 20.0-20.9, adult: Secondary | ICD-10-CM | POA: Diagnosis not present

## 2017-04-01 DIAGNOSIS — J111 Influenza due to unidentified influenza virus with other respiratory manifestations: Secondary | ICD-10-CM | POA: Diagnosis not present

## 2017-04-01 DIAGNOSIS — J029 Acute pharyngitis, unspecified: Secondary | ICD-10-CM | POA: Diagnosis not present

## 2017-04-01 DIAGNOSIS — J329 Chronic sinusitis, unspecified: Secondary | ICD-10-CM | POA: Diagnosis not present

## 2017-04-18 DIAGNOSIS — J029 Acute pharyngitis, unspecified: Secondary | ICD-10-CM | POA: Diagnosis not present

## 2017-04-18 DIAGNOSIS — R05 Cough: Secondary | ICD-10-CM | POA: Diagnosis not present

## 2017-04-18 DIAGNOSIS — B9789 Other viral agents as the cause of diseases classified elsewhere: Secondary | ICD-10-CM | POA: Diagnosis not present

## 2017-04-18 DIAGNOSIS — Z6821 Body mass index (BMI) 21.0-21.9, adult: Secondary | ICD-10-CM | POA: Diagnosis not present

## 2017-04-18 DIAGNOSIS — R5383 Other fatigue: Secondary | ICD-10-CM | POA: Diagnosis not present

## 2017-04-18 DIAGNOSIS — J111 Influenza due to unidentified influenza virus with other respiratory manifestations: Secondary | ICD-10-CM | POA: Diagnosis not present

## 2017-05-17 DIAGNOSIS — Z87440 Personal history of urinary (tract) infections: Secondary | ICD-10-CM | POA: Diagnosis not present

## 2017-06-10 ENCOUNTER — Ambulatory Visit (INDEPENDENT_AMBULATORY_CARE_PROVIDER_SITE_OTHER): Payer: Medicare HMO | Admitting: Internal Medicine

## 2017-06-10 DIAGNOSIS — Z298 Encounter for other specified prophylactic measures: Secondary | ICD-10-CM

## 2017-06-10 DIAGNOSIS — Z23 Encounter for immunization: Secondary | ICD-10-CM

## 2017-06-10 DIAGNOSIS — Z7189 Other specified counseling: Secondary | ICD-10-CM | POA: Diagnosis not present

## 2017-06-10 DIAGNOSIS — Z7185 Encounter for immunization safety counseling: Secondary | ICD-10-CM

## 2017-06-10 DIAGNOSIS — Z7184 Encounter for health counseling related to travel: Secondary | ICD-10-CM

## 2017-06-10 DIAGNOSIS — Z789 Other specified health status: Secondary | ICD-10-CM | POA: Diagnosis not present

## 2017-06-10 DIAGNOSIS — Z9189 Other specified personal risk factors, not elsewhere classified: Secondary | ICD-10-CM

## 2017-06-10 MED ORDER — DEXAMETHASONE 4 MG PO TABS: 4.0000 mg | ORAL_TABLET | Freq: Four times a day (QID) | ORAL | 0 refills | Status: DC

## 2017-06-10 MED ORDER — AZITHROMYCIN 500 MG PO TABS: 500.0000 mg | ORAL_TABLET | Freq: Every day | ORAL | 0 refills | Status: DC

## 2017-06-10 NOTE — Progress Notes (Signed)
Subjective:   Pamela Snow is a 77 y.o. female who presents to the Infectious Disease clinic for travel consultation. Planned departure date: may 10-20th going to [place] then oct 1-9th going to [place]      Planned return date: as abpve Countries of travel: [place] and [place] Areas in country: urban and rural Accommodations: hotels Purpose of travel: vacation Prior travel out of [place]: yes  uptodate on vacines including tdap and flu   Objective:   Medications: synthroid, wellbutrin, verapamil, pantoprazole, lunesta, topiramate, nitrafurantoin  Allergines: flagy, cipro, amoxicillin, bactrim/sulfa-- anaphylaxis, clindamycin    Assessment:   No contraindications to travel. none   Plan:  Pre travel vaccination = will give hep A #1 and typhoid injection. The travel in [place] does not require yellow fever  Malaria prophylaxis = does not need malarone only mosquito bite prevention/deet spray  Traveler's diarrhea =gave rx for azithromycin to use if needed  Hx of altitude sickness = will give dexamethasone 4mg  Q 6hr. To use on demand, with short taper. Due to anaphylaxis with sulfa, likley not to tolerate acetazolamide.pt did not want to try challenge doses prior to leaving -which I can understand her hesistation. Discussed that they can come back for further counseling for next trip if needed

## 2017-06-23 ENCOUNTER — Telehealth: Payer: Self-pay | Admitting: Internal Medicine

## 2017-06-23 NOTE — Telephone Encounter (Signed)
Pt states for about a month she has been having bloating and abdominal pain. Pt states she has tried a bland diet and it has not helped. Pt requests to be seen and requests either Nevin Bloodgood or [facility]. Pt scheduled to see Alonza Bogus PA 07/11/17@10 :30am. Pt aware of appt.

## 2017-07-11 ENCOUNTER — Encounter: Payer: Self-pay | Admitting: Gastroenterology

## 2017-07-11 ENCOUNTER — Ambulatory Visit: Payer: Medicare HMO | Admitting: Gastroenterology

## 2017-07-11 VITALS — BP 112/76 | HR 72 | Ht 68.0 in | Wt 134.2 lb

## 2017-07-11 DIAGNOSIS — R1013 Epigastric pain: Secondary | ICD-10-CM | POA: Diagnosis not present

## 2017-07-11 NOTE — Patient Instructions (Addendum)
If you are age 75 or older, your body mass index should be between 23-30. Your Body mass index is 20.41 kg/m. If this is out of the aforementioned range listed, please consider follow up with your Primary Care Provider.  If you are age 78 or younger, your body mass index should be between 19-25. Your Body mass index is 20.41 kg/m. If this is out of the aformentioned range listed, please consider follow up with your Primary Care Provider.   Continue taking your Pantoprazole 20mg  increase to twice a day.  You have been scheduled for a colonoscopy. Please follow written instructions given to you at your visit today.  Please pick up your prep supplies at the pharmacy within the next 1-3 days. If you use inhalers (even only as needed), please bring them with you on the day of your procedure. Your physician has requested that you go to www.startemmi.com and enter the access code given to you at your visit today. This web site gives a general overview about your procedure. However, you should still follow specific instructions given to you by our office regarding your preparation for the procedure.  Thank you for choosing [facility], Alonza Bogus, PA-C

## 2017-07-11 NOTE — Progress Notes (Addendum)
07/11/2017 Pamela Snow [id number] 1941/03/02   HISTORY OF PRESENT ILLNESS:  This is a pleasant 77 year old female who is known to Dr. Hilarie Fredrickson.  Had colonoscopy last year with several polyps removed; is in for 3 year colonoscopy recall.  She presents to our office today with complaints of epigastric abdominal pain.  She says that this has been occurring for at least the past 4 months or so and mostly happens when she eats meat or poultry.  Says that as long as she sticks with a bland diet then she feels ok.  Tolerates fish ok as well.  Says that about 2 hours or so after eating these foods that she gets severe epigastric pain.  She denies nausea, vomiting, diarrhea.  She is on pantoprazole 20 mg daily and overall feels that her reflux is controlled.  Occasionally takes a second pantoprazole in the evening if she feels like she is having more indigestion, however.  Has long-standing constipation but takes miralax daily and really has no changes or new issues with that.  Her weight is down 15 pounds from one year ago.   Past Medical History:  Diagnosis Date  . Adenomatous colon polyp   . Arthritis   . B12 deficiency   . Chronic constipation   . Diverticulitis   . Diverticulosis    hx of diverticulitis  . Gall stones   . Generalized headaches    states never had until trouble with parathyroidism  . GERD (gastroesophageal reflux disease)   . Hiatal hernia   . Hx of cardiac catheterization    a. LHC (10/2013):  normal coronary arteries, EF 65%   . Hyperparathyroidism   . Hypertension   . Kidney stones   . Symptomatic cholelithiasis 05/26/2012   s/p cholecystectomy   Past Surgical History:  Procedure Laterality Date  . ABDOMINAL HYSTERECTOMY  2002  . CHOLECYSTECTOMY N/A 05/25/2012   Procedure: LAPAROSCOPIC CHOLECYSTECTOMY WITH INTRAOPERATIVE CHOLANGIOGRAM;  Surgeon: Leighton Ruff, MD;  Location: [facility];  Service: General;  Laterality: N/A;  unable to come before 12  . JOINT  REPLACEMENT     bilateral  . LAPAROSCOPIC COLON RESECTION  04/2011   [facility];diverticullitis  . LEFT HEART CATHETERIZATION WITH CORONARY ANGIOGRAM N/A 10/12/2013   Procedure: LEFT HEART CATHETERIZATION WITH CORONARY ANGIOGRAM;  Surgeon: Sinclair Grooms, MD;  Location: [facility] CATH LAB;  Service: Cardiovascular;  Laterality: N/A;  . PARATHYROIDECTOMY  03/12/2011   Procedure: PARATHYROIDECTOMY;  Surgeon: Earnstine Regal, MD;  Location: [facility];  Service: General;  Laterality: Left;  Left superior parathyroidectomy, minimally invasive   . TONSILLECTOMY    . TOTAL KNEE ARTHROPLASTY  2005   bilateral  . VAGINAL HYSTERECTOMY      reports that she quit smoking about 17 years ago. Her smoking use included cigarettes. She has never used smokeless tobacco. She reports that she drinks alcohol. She reports that she does not use drugs. family history includes Cancer in her mother and sister; Colon cancer in her mother and sister; Heart disease in her father; Heart failure in her mother. Allergies  Allergen Reactions  . Ciprofloxacin     Serum sickness    REQUIRING HOSPITALIZATION  . Flagyl [Metronidazole Hcl]     Serum sickness/   REQUIRING HOSPITALIZATION  . Scopolamine     Pt states she becomes very confused and has vertigo with scopolamine patch  . Azactam [Aztreonam] Hives  . Clindamycin/Lincomycin     hives  . Penicillins  Hives and Other (See Comments)    Thrush  . Bactrim [Sulfamethoxazole-Trimethoprim]     swollen tongue  . Moviprep [Peg-Kcl-Nacl-Nasulf-Na Asc-C] Other (See Comments)    Pt's abdomen swells with the prep and she never "processes" the solution  . Scallops [Shellfish Allergy] Nausea And Vomiting      Outpatient Encounter Medications as of 07/11/2017  Medication Sig  . [DISCONTINUED] aspirin EC 325 MG tablet Take 325 mg by mouth daily.  Marland Kitchen ALPRAZolam (XANAX) 0.5 MG tablet Take 0.5 mg by mouth daily as needed for anxiety.  Marland Kitchen azithromycin (ZITHROMAX) 500 MG tablet Take 1  tablet (500 mg total) by mouth daily.  Marland Kitchen buPROPion (WELLBUTRIN SR) 150 MG 12 hr tablet Take 150 mg by mouth every morning.  . cyanocobalamin (,VITAMIN B-12,) 1000 MCG/ML injection Inject 1,000 mcg into the muscle every 30 (thirty) days. Inject 1 ml into a muscle once weekly for 3 weeks, then inject 1 ml into muscle monthly for a year.  Marland Kitchen dexamethasone (DECADRON) 4 MG tablet Take 1 tablet (4 mg total) by mouth 4 (four) times daily. Start on 10/3, day of going to [place] x 3 days; then 3x/day x1 d; then 2x/day x 1d  . Eszopiclone (ESZOPICLONE) 3 MG TABS Take 3 mg by mouth at bedtime as needed (for sleep). Take immediately before bedtime  . Eszopiclone (LUNESTA PO) Take by mouth.  . fluticasone (FLONASE) 50 MCG/ACT nasal spray Place 2 sprays into both nostrils daily.  Marland Kitchen HYDROcodone-acetaminophen (NORCO) 5-325 MG tablet Take 1 tablet by mouth every 6 (six) hours as needed.  Marland Kitchen levothyroxine (SYNTHROID, LEVOTHROID) 50 MCG tablet Take 50 mcg by mouth every morning.   . pantoprazole (PROTONIX) 20 MG tablet Take 20 mg by mouth daily.  . polyethylene glycol powder (GLYCOLAX/MIRALAX) powder Take 34 g by mouth daily.  . rifaximin (XIFAXAN) 550 MG TABS tablet Take 1 tablet (550 mg total) by mouth 3 (three) times daily.  . Temazepam (RESTORIL PO) Take 1 tablet by mouth at bedtime as needed (for sleep).   . topiramate (TOPAMAX) 25 MG capsule Take 75 mg by mouth at bedtime.   . verapamil (CALAN) 120 MG tablet Take 120 mg by mouth 2 (two) times daily.   . vitamin B-12 (CYANOCOBALAMIN) 1000 MCG tablet Take 1,000 mcg by mouth daily.  . [DISCONTINUED] aspirin EC 81 MG tablet Take 81 mg by mouth daily.  . [DISCONTINUED] esomeprazole (NEXIUM) 40 MG capsule Take one capsule twice daily (30 minutes before eating) for one week.  Then, resume one capsule daily.  . [DISCONTINUED] famotidine (PEPCID) 20 MG tablet Take 40 mg by mouth daily.  . [DISCONTINUED] pseudoephedrine (SUDAFED) 60 MG tablet Take 1 tablet (60 mg total) by  mouth every 6 (six) hours. For nasal congestion/sinusitis (Patient not taking: Reported on 07/13/2016)  . [DISCONTINUED] sucralfate (CARAFATE) 1 g tablet Take 1 gram between meals and at bedtime. (Patient not taking: Reported on 07/13/2016)   No facility-administered encounter medications on file as of 07/11/2017.      REVIEW OF SYSTEMS  : All other systems reviewed and negative except where noted in the History of Present Illness.   PHYSICAL EXAM: Wt 134 lb 3.2 oz (60.9 kg)   BMI 20.41 kg/m  General: Well developed white female in no acute distress Head: Normocephalic and atraumatic Eyes:  Sclerae anicteric, conjunctiva pink. Ears: Normal auditory acuity Lungs: Clear throughout to auscultation; no W/R/R. Heart: Regular rate and rhythm; no M/R/G. Abdomen: Soft, non-distended.  BS present.  Mild diffuse TTP.  Musculoskeletal: Symmetrical with no gross deformities  Skin: No lesions on visible extremities Extremities: No edema  Neurological: Alert oriented x 4, grossly non-focal Psychological:  Alert and cooperative. Normal mood and affect  ASSESSMENT AND PLAN: *77 year old female with epigastric abdominal pain:  Mostly occurs after eating meat/poultry.  Overall does fine if she follows a very bland diet.  Really no other associated symptoms.  Will schedule for EGD with Dr. Hilarie Fredrickson.  I am also going to have her increase her pantoprazole to twice daily since she is only on 20 mg.  ? If she has gastroparesis and may need GES pending results of EGD.  **The risks, benefits, and alternatives to EGD were discussed with the patient and she consents to proceed.   CC:  Shon Baton, MD  Addendum: Reviewed and agree with initial management. Pyrtle, Lajuan Lines, MD

## 2017-07-13 ENCOUNTER — Other Ambulatory Visit: Payer: Medicare HMO

## 2017-07-13 ENCOUNTER — Ambulatory Visit (AMBULATORY_SURGERY_CENTER): Payer: Medicare HMO | Admitting: Internal Medicine

## 2017-07-13 ENCOUNTER — Other Ambulatory Visit: Payer: Self-pay

## 2017-07-13 ENCOUNTER — Encounter: Payer: Self-pay | Admitting: Internal Medicine

## 2017-07-13 VITALS — BP 155/80 | HR 59 | Temp 97.3°F | Resp 13 | Ht 68.0 in | Wt 134.0 lb

## 2017-07-13 DIAGNOSIS — R1013 Epigastric pain: Secondary | ICD-10-CM

## 2017-07-13 DIAGNOSIS — K2970 Gastritis, unspecified, without bleeding: Secondary | ICD-10-CM

## 2017-07-13 DIAGNOSIS — K317 Polyp of stomach and duodenum: Secondary | ICD-10-CM

## 2017-07-13 DIAGNOSIS — K2950 Unspecified chronic gastritis without bleeding: Secondary | ICD-10-CM | POA: Diagnosis not present

## 2017-07-13 MED ORDER — SODIUM CHLORIDE 0.9 % IV SOLN: 500.0000 mL | Freq: Once | INTRAVENOUS | Status: DC

## 2017-07-13 NOTE — Progress Notes (Signed)
Called to room to assist during endoscopic procedure.  Patient ID and intended procedure confirmed with present staff. Received instructions for my participation in the procedure from the performing physician.  

## 2017-07-13 NOTE — Patient Instructions (Signed)
   INFORMATION ON GASTRITIS GIVEN TO YOU TODAY  AWAIT BIOPSY RESULTS ( ON INFLAMMATION AND GASTRIC POLYPS)   YOU HAD AN ENDOSCOPIC PROCEDURE TODAY AT [facility]:   Refer to the procedure report that was given to you for any specific questions about what was found during the examination.  If the procedure report does not answer your questions, please call your gastroenterologist to clarify.  If you requested that your care partner not be given the details of your procedure findings, then the procedure report has been included in a sealed envelope for you to review at your convenience later.  YOU SHOULD EXPECT: Some feelings of bloating in the abdomen. Passage of more gas than usual.  Walking can help get rid of the air that was put into your GI tract during the procedure and reduce the bloating. If you had a lower endoscopy (such as a colonoscopy or flexible sigmoidoscopy) you may notice spotting of blood in your stool or on the toilet paper. If you underwent a bowel prep for your procedure, you may not have a normal bowel movement for a few days.  Please Note:  You might notice some irritation and congestion in your nose or some drainage.  This is from the oxygen used during your procedure.  There is no need for concern and it should clear up in a day or so.  SYMPTOMS TO REPORT IMMEDIATELY:     Following upper endoscopy (EGD)  Vomiting of blood or coffee ground material  New chest pain or pain under the shoulder blades  Painful or persistently difficult swallowing  New shortness of breath  Fever of 100F or higher  Black, tarry-looking stools  For urgent or emergent issues, a gastroenterologist can be reached at any hour by calling [phone].   DIET:  We do recommend a small meal at first, but then you may proceed to your regular diet.  Drink plenty of fluids but you should avoid alcoholic beverages for 24 hours.  ACTIVITY:  You should plan to take it easy for the  rest of today and you should NOT DRIVE or use heavy machinery until tomorrow (because of the sedation medicines used during the test).    FOLLOW UP: Our staff will call the number listed on your records the next business day following your procedure to check on you and address any questions or concerns that you may have regarding the information given to you following your procedure. If we do not reach you, we will leave a message.  However, if you are feeling well and you are not experiencing any problems, there is no need to return our call.  We will assume that you have returned to your regular daily activities without incident.  If any biopsies were taken you will be contacted by phone or by letter within the next 1-3 weeks.  Please call us at [phone] if you have not heard about the biopsies in 3 weeks.    SIGNATURES/CONFIDENTIALITY: You and/or your care partner have signed paperwork which will be entered into your electronic medical record.  These signatures attest to the fact that that the information above on your After Visit Summary has been reviewed and is understood.  Full responsibility of the confidentiality of this discharge information lies with you and/or your care-partner.

## 2017-07-13 NOTE — Progress Notes (Signed)
To PACU, VSS. Report to [email or web address] 

## 2017-07-13 NOTE — Op Note (Signed)
[place] Patient Name: Pamela Snow Procedure Date: 07/13/2017 2:12 PM MRN: [id number] Endoscopist: Jerene Bears , MD Age: 77 Referring MD:  Date of Birth: [birth year] Gender: Female Account #: [id number] Procedure:                Upper GI endoscopy Indications:              Epigastric abdominal pain Medicines:                Monitored Anesthesia Care Procedure:                Pre-Anesthesia Assessment:                           - Prior to the procedure, a History and Physical                            was performed, and patient medications and                            allergies were reviewed. The patient's tolerance of                            previous anesthesia was also reviewed. The risks                            and benefits of the procedure and the sedation                            options and risks were discussed with the patient.                            All questions were answered, and informed consent                            was obtained. Prior Anticoagulants: The patient has                            taken no previous anticoagulant or antiplatelet                            agents. ASA Grade Assessment: II - A patient with                            mild systemic disease. After reviewing the risks                            and benefits, the patient was deemed in                            satisfactory condition to undergo the procedure.                           After obtaining informed consent, the endoscope was  passed under direct vision. Throughout the                            procedure, the patient's blood pressure, pulse, and                            oxygen saturations were monitored continuously. The                            Model GIF-HQ190 [id number]) scope was introduced                            through the mouth, and advanced to the second part                            of duodenum. The upper GI  endoscopy was                            accomplished without difficulty. The patient                            tolerated the procedure well. Scope In: Scope Out: Findings:                 The examined esophagus was normal.                           Mild inflammation characterized by erythema and                            granularity was found at the incisura and in the                            gastric antrum. Biopsies were taken with a cold                            forceps for histology and Helicobacter pylori                            testing.                           Multiple small sessile and semi-sessile polyps were                            found in the gastric fundus and in the gastric                            body. These are benign appearing and have the                            typical appearance of fundic gland polyps. Multiple                            sampling biopsies were obtained from  different                            polyps with cold forceps for histology in a                            targeted manner.                           The examined duodenum was normal. Complications:            No immediate complications. Estimated Blood Loss:     Estimated blood loss was minimal. Impression:               - Normal esophagus.                           - Mild gastritis. Biopsied.                           - Multiple gastric polyps. Biopsied.                           - Normal examined duodenum. Recommendation:           - Patient has a contact number available for                            emergencies. The signs and symptoms of potential                            delayed complications were discussed with the                            patient. Return to normal activities tomorrow.                            Written discharge instructions were provided to the                            patient.                           - Resume previous diet.                            - Continue present medications.                           - Await pathology results.                           - Check alpha-gal antibody given intolerance to                            beef and pork. Jerene Bears, MD 07/13/2017 2:35:32 PM This report has been signed electronically.

## 2017-07-13 NOTE — Progress Notes (Signed)
Pt's states no medical or surgical changes since previsit or office visit. 

## 2017-07-14 ENCOUNTER — Telehealth: Payer: Self-pay | Admitting: *Deleted

## 2017-07-14 NOTE — Telephone Encounter (Signed)
  Follow up Call-  Call back number 07/13/2017 07/13/2016 11/07/2014  Post procedure Call Back phone  # [phone]  Permission to leave phone message Yes Yes Yes  Some recent data might be hidden     Patient questions:  Do you have a fever, pain , or abdominal swelling? No. Pain Score  0 *  Have you tolerated food without any problems? Yes.    Have you been able to return to your normal activities? Yes.    Do you have any questions about your discharge instructions: Diet   No. Medications  No. Follow up visit  No.  Do you have questions or concerns about your Care? No.  Actions: * If pain score is 4 or above: No action needed, pain <4.

## 2017-07-18 ENCOUNTER — Encounter: Payer: Self-pay | Admitting: Internal Medicine

## 2017-07-18 LAB — ALPHA-GAL PANEL
Beef IgE: <0.1 kU/L (ref <0.35)
Class: 0
Class: 0
Class: 0
Galactose-alpha-1,3-galactose IgE: <0.1 kU/L (ref <0.10)
LAMB/MUTTON IGE: <0.1 kU/L (ref <0.35)
Pork IgE: <0.1 kU/L (ref <0.35)

## 2017-09-20 DIAGNOSIS — M4186 Other forms of scoliosis, lumbar region: Secondary | ICD-10-CM | POA: Diagnosis not present

## 2017-09-20 DIAGNOSIS — Z85828 Personal history of other malignant neoplasm of skin: Secondary | ICD-10-CM | POA: Diagnosis not present

## 2017-09-20 DIAGNOSIS — Z6821 Body mass index (BMI) 21.0-21.9, adult: Secondary | ICD-10-CM | POA: Diagnosis not present

## 2017-09-20 DIAGNOSIS — K5909 Other constipation: Secondary | ICD-10-CM | POA: Diagnosis not present

## 2017-09-20 DIAGNOSIS — L821 Other seborrheic keratosis: Secondary | ICD-10-CM | POA: Diagnosis not present

## 2017-09-20 DIAGNOSIS — M5117 Intervertebral disc disorders with radiculopathy, lumbosacral region: Secondary | ICD-10-CM | POA: Diagnosis not present

## 2017-09-20 DIAGNOSIS — L84 Corns and callosities: Secondary | ICD-10-CM | POA: Diagnosis not present

## 2017-09-20 DIAGNOSIS — M1990 Unspecified osteoarthritis, unspecified site: Secondary | ICD-10-CM | POA: Diagnosis not present

## 2017-09-20 DIAGNOSIS — E038 Other specified hypothyroidism: Secondary | ICD-10-CM | POA: Diagnosis not present

## 2017-09-21 ENCOUNTER — Other Ambulatory Visit: Payer: Self-pay | Admitting: Internal Medicine

## 2017-09-21 DIAGNOSIS — M5117 Intervertebral disc disorders with radiculopathy, lumbosacral region: Secondary | ICD-10-CM

## 2017-09-25 ENCOUNTER — Ambulatory Visit
Admission: RE | Admit: 2017-09-25 | Discharge: 2017-09-25 | Disposition: A | Discharge status: Home / Self Care | Payer: Medicare HMO | Source: Ambulatory Visit | Attending: Internal Medicine | Admitting: Internal Medicine

## 2017-09-25 DIAGNOSIS — M5117 Intervertebral disc disorders with radiculopathy, lumbosacral region: Secondary | ICD-10-CM

## 2017-09-25 DIAGNOSIS — M48061 Spinal stenosis, lumbar region without neurogenic claudication: Secondary | ICD-10-CM | POA: Diagnosis not present

## 2017-09-29 DIAGNOSIS — M545 Low back pain: Secondary | ICD-10-CM | POA: Diagnosis not present

## 2017-09-29 DIAGNOSIS — M48061 Spinal stenosis, lumbar region without neurogenic claudication: Secondary | ICD-10-CM | POA: Diagnosis not present

## 2017-09-29 DIAGNOSIS — M5136 Other intervertebral disc degeneration, lumbar region: Secondary | ICD-10-CM | POA: Diagnosis not present

## 2017-10-12 DIAGNOSIS — M48061 Spinal stenosis, lumbar region without neurogenic claudication: Secondary | ICD-10-CM | POA: Diagnosis not present

## 2017-10-12 DIAGNOSIS — M5416 Radiculopathy, lumbar region: Secondary | ICD-10-CM | POA: Diagnosis not present

## 2017-10-12 DIAGNOSIS — M4316 Spondylolisthesis, lumbar region: Secondary | ICD-10-CM | POA: Diagnosis not present

## 2017-10-12 DIAGNOSIS — M5126 Other intervertebral disc displacement, lumbar region: Secondary | ICD-10-CM | POA: Diagnosis not present

## 2017-10-12 DIAGNOSIS — M4120 Other idiopathic scoliosis, site unspecified: Secondary | ICD-10-CM | POA: Diagnosis not present

## 2017-10-12 DIAGNOSIS — M545 Low back pain: Secondary | ICD-10-CM | POA: Diagnosis not present

## 2017-10-12 DIAGNOSIS — I10 Essential (primary) hypertension: Secondary | ICD-10-CM | POA: Diagnosis not present

## 2017-10-12 DIAGNOSIS — M4126 Other idiopathic scoliosis, lumbar region: Secondary | ICD-10-CM | POA: Diagnosis not present

## 2017-10-20 DIAGNOSIS — M48061 Spinal stenosis, lumbar region without neurogenic claudication: Secondary | ICD-10-CM | POA: Diagnosis not present

## 2017-11-08 DIAGNOSIS — H6993 Unspecified Eustachian tube disorder, bilateral: Secondary | ICD-10-CM | POA: Diagnosis not present

## 2017-11-10 DIAGNOSIS — M48061 Spinal stenosis, lumbar region without neurogenic claudication: Secondary | ICD-10-CM | POA: Diagnosis not present

## 2017-12-07 DIAGNOSIS — M48061 Spinal stenosis, lumbar region without neurogenic claudication: Secondary | ICD-10-CM | POA: Diagnosis not present

## 2017-12-07 DIAGNOSIS — M5126 Other intervertebral disc displacement, lumbar region: Secondary | ICD-10-CM | POA: Diagnosis not present

## 2017-12-07 DIAGNOSIS — M4126 Other idiopathic scoliosis, lumbar region: Secondary | ICD-10-CM | POA: Diagnosis not present

## 2017-12-07 DIAGNOSIS — M4316 Spondylolisthesis, lumbar region: Secondary | ICD-10-CM | POA: Diagnosis not present

## 2017-12-07 DIAGNOSIS — M545 Low back pain: Secondary | ICD-10-CM | POA: Diagnosis not present

## 2017-12-07 DIAGNOSIS — I10 Essential (primary) hypertension: Secondary | ICD-10-CM | POA: Diagnosis not present

## 2017-12-07 DIAGNOSIS — M5416 Radiculopathy, lumbar region: Secondary | ICD-10-CM | POA: Diagnosis not present

## 2017-12-14 DIAGNOSIS — M545 Low back pain: Secondary | ICD-10-CM | POA: Diagnosis not present

## 2017-12-20 DIAGNOSIS — M545 Low back pain: Secondary | ICD-10-CM | POA: Diagnosis not present

## 2017-12-23 DIAGNOSIS — M545 Low back pain: Secondary | ICD-10-CM | POA: Diagnosis not present

## 2017-12-27 DIAGNOSIS — M545 Low back pain: Secondary | ICD-10-CM | POA: Diagnosis not present

## 2017-12-29 DIAGNOSIS — M5416 Radiculopathy, lumbar region: Secondary | ICD-10-CM | POA: Diagnosis not present

## 2018-01-09 DIAGNOSIS — I10 Essential (primary) hypertension: Secondary | ICD-10-CM | POA: Diagnosis not present

## 2018-01-09 DIAGNOSIS — M5126 Other intervertebral disc displacement, lumbar region: Secondary | ICD-10-CM | POA: Diagnosis not present

## 2018-01-09 DIAGNOSIS — M48061 Spinal stenosis, lumbar region without neurogenic claudication: Secondary | ICD-10-CM | POA: Diagnosis not present

## 2018-01-09 DIAGNOSIS — M545 Low back pain: Secondary | ICD-10-CM | POA: Diagnosis not present

## 2018-01-09 DIAGNOSIS — M5416 Radiculopathy, lumbar region: Secondary | ICD-10-CM | POA: Diagnosis not present

## 2018-01-09 DIAGNOSIS — M4126 Other idiopathic scoliosis, lumbar region: Secondary | ICD-10-CM | POA: Diagnosis not present

## 2018-01-09 DIAGNOSIS — M4316 Spondylolisthesis, lumbar region: Secondary | ICD-10-CM | POA: Diagnosis not present

## 2018-01-09 DIAGNOSIS — M4120 Other idiopathic scoliosis, site unspecified: Secondary | ICD-10-CM | POA: Diagnosis not present

## 2018-01-16 DIAGNOSIS — E538 Deficiency of other specified B group vitamins: Secondary | ICD-10-CM | POA: Diagnosis not present

## 2018-01-16 DIAGNOSIS — E038 Other specified hypothyroidism: Secondary | ICD-10-CM | POA: Diagnosis not present

## 2018-01-16 DIAGNOSIS — R82998 Other abnormal findings in urine: Secondary | ICD-10-CM | POA: Diagnosis not present

## 2018-01-23 DIAGNOSIS — D692 Other nonthrombocytopenic purpura: Secondary | ICD-10-CM | POA: Diagnosis not present

## 2018-01-23 DIAGNOSIS — E538 Deficiency of other specified B group vitamins: Secondary | ICD-10-CM | POA: Diagnosis not present

## 2018-01-23 DIAGNOSIS — I700 Atherosclerosis of aorta: Secondary | ICD-10-CM | POA: Diagnosis not present

## 2018-01-23 DIAGNOSIS — R69 Illness, unspecified: Secondary | ICD-10-CM | POA: Diagnosis not present

## 2018-01-23 DIAGNOSIS — M4186 Other forms of scoliosis, lumbar region: Secondary | ICD-10-CM | POA: Diagnosis not present

## 2018-01-23 DIAGNOSIS — Z23 Encounter for immunization: Secondary | ICD-10-CM | POA: Diagnosis not present

## 2018-01-23 DIAGNOSIS — D72819 Decreased white blood cell count, unspecified: Secondary | ICD-10-CM | POA: Diagnosis not present

## 2018-01-23 DIAGNOSIS — Z0000 Encounter for general adult medical examination without abnormal findings: Secondary | ICD-10-CM | POA: Diagnosis not present

## 2018-01-23 DIAGNOSIS — M8580 Other specified disorders of bone density and structure, unspecified site: Secondary | ICD-10-CM | POA: Diagnosis not present

## 2018-01-23 DIAGNOSIS — M5117 Intervertebral disc disorders with radiculopathy, lumbosacral region: Secondary | ICD-10-CM | POA: Diagnosis not present

## 2018-01-27 DIAGNOSIS — Z1212 Encounter for screening for malignant neoplasm of rectum: Secondary | ICD-10-CM | POA: Diagnosis not present

## 2018-02-06 DIAGNOSIS — M48061 Spinal stenosis, lumbar region without neurogenic claudication: Secondary | ICD-10-CM | POA: Diagnosis not present

## 2018-02-06 DIAGNOSIS — M5126 Other intervertebral disc displacement, lumbar region: Secondary | ICD-10-CM | POA: Diagnosis not present

## 2018-02-06 DIAGNOSIS — M545 Low back pain: Secondary | ICD-10-CM | POA: Diagnosis not present

## 2018-02-06 DIAGNOSIS — M5416 Radiculopathy, lumbar region: Secondary | ICD-10-CM | POA: Diagnosis not present

## 2018-02-06 DIAGNOSIS — M4126 Other idiopathic scoliosis, lumbar region: Secondary | ICD-10-CM | POA: Diagnosis not present

## 2018-02-06 DIAGNOSIS — M4120 Other idiopathic scoliosis, site unspecified: Secondary | ICD-10-CM | POA: Diagnosis not present

## 2018-02-09 DIAGNOSIS — M5126 Other intervertebral disc displacement, lumbar region: Secondary | ICD-10-CM | POA: Diagnosis not present

## 2018-02-09 DIAGNOSIS — M545 Low back pain: Secondary | ICD-10-CM | POA: Diagnosis not present

## 2018-02-20 DIAGNOSIS — M545 Low back pain: Secondary | ICD-10-CM | POA: Diagnosis not present

## 2018-02-20 DIAGNOSIS — M4316 Spondylolisthesis, lumbar region: Secondary | ICD-10-CM | POA: Diagnosis not present

## 2018-02-20 DIAGNOSIS — M4120 Other idiopathic scoliosis, site unspecified: Secondary | ICD-10-CM | POA: Diagnosis not present

## 2018-02-20 DIAGNOSIS — M5416 Radiculopathy, lumbar region: Secondary | ICD-10-CM | POA: Diagnosis not present

## 2018-02-20 DIAGNOSIS — M48061 Spinal stenosis, lumbar region without neurogenic claudication: Secondary | ICD-10-CM | POA: Diagnosis not present

## 2018-02-20 DIAGNOSIS — M4126 Other idiopathic scoliosis, lumbar region: Secondary | ICD-10-CM | POA: Diagnosis not present

## 2018-02-20 DIAGNOSIS — I10 Essential (primary) hypertension: Secondary | ICD-10-CM | POA: Diagnosis not present

## 2018-02-20 DIAGNOSIS — M5126 Other intervertebral disc displacement, lumbar region: Secondary | ICD-10-CM | POA: Diagnosis not present

## 2018-02-22 DIAGNOSIS — H0015 Chalazion left lower eyelid: Secondary | ICD-10-CM | POA: Diagnosis not present

## 2018-02-22 DIAGNOSIS — L03213 Periorbital cellulitis: Secondary | ICD-10-CM | POA: Diagnosis not present

## 2018-02-24 DIAGNOSIS — H00026 Hordeolum internum left eye, unspecified eyelid: Secondary | ICD-10-CM | POA: Diagnosis not present

## 2018-02-24 DIAGNOSIS — T7840XA Allergy, unspecified, initial encounter: Secondary | ICD-10-CM | POA: Diagnosis not present

## 2018-02-24 DIAGNOSIS — Z6821 Body mass index (BMI) 21.0-21.9, adult: Secondary | ICD-10-CM | POA: Diagnosis not present

## 2018-02-24 DIAGNOSIS — L509 Urticaria, unspecified: Secondary | ICD-10-CM | POA: Diagnosis not present

## 2018-02-27 DIAGNOSIS — M4120 Other idiopathic scoliosis, site unspecified: Secondary | ICD-10-CM | POA: Diagnosis not present

## 2018-02-27 DIAGNOSIS — H0015 Chalazion left lower eyelid: Secondary | ICD-10-CM | POA: Diagnosis not present

## 2018-03-23 DIAGNOSIS — M47816 Spondylosis without myelopathy or radiculopathy, lumbar region: Secondary | ICD-10-CM | POA: Diagnosis not present

## 2018-04-27 DIAGNOSIS — D649 Anemia, unspecified: Secondary | ICD-10-CM | POA: Diagnosis not present

## 2018-04-27 DIAGNOSIS — D692 Other nonthrombocytopenic purpura: Secondary | ICD-10-CM | POA: Diagnosis not present

## 2018-05-01 DIAGNOSIS — H6123 Impacted cerumen, bilateral: Secondary | ICD-10-CM | POA: Diagnosis not present

## 2018-05-03 DIAGNOSIS — Z96653 Presence of artificial knee joint, bilateral: Secondary | ICD-10-CM | POA: Diagnosis not present

## 2018-05-03 DIAGNOSIS — I10 Essential (primary) hypertension: Secondary | ICD-10-CM | POA: Diagnosis not present

## 2018-05-03 DIAGNOSIS — Z471 Aftercare following joint replacement surgery: Secondary | ICD-10-CM | POA: Diagnosis not present

## 2018-05-03 DIAGNOSIS — M25562 Pain in left knee: Secondary | ICD-10-CM | POA: Diagnosis not present

## 2018-05-03 DIAGNOSIS — M47816 Spondylosis without myelopathy or radiculopathy, lumbar region: Secondary | ICD-10-CM | POA: Diagnosis not present

## 2018-06-07 DIAGNOSIS — H5203 Hypermetropia, bilateral: Secondary | ICD-10-CM | POA: Diagnosis not present

## 2018-06-07 DIAGNOSIS — H52203 Unspecified astigmatism, bilateral: Secondary | ICD-10-CM | POA: Diagnosis not present

## 2018-06-07 DIAGNOSIS — H524 Presbyopia: Secondary | ICD-10-CM | POA: Diagnosis not present

## 2018-06-07 DIAGNOSIS — H25813 Combined forms of age-related cataract, bilateral: Secondary | ICD-10-CM | POA: Diagnosis not present

## 2018-06-13 DIAGNOSIS — H903 Sensorineural hearing loss, bilateral: Secondary | ICD-10-CM | POA: Diagnosis not present

## 2018-07-06 DIAGNOSIS — R69 Illness, unspecified: Secondary | ICD-10-CM | POA: Diagnosis not present

## 2018-07-06 DIAGNOSIS — E538 Deficiency of other specified B group vitamins: Secondary | ICD-10-CM | POA: Diagnosis not present

## 2018-07-06 DIAGNOSIS — M5117 Intervertebral disc disorders with radiculopathy, lumbosacral region: Secondary | ICD-10-CM | POA: Diagnosis not present

## 2018-07-06 DIAGNOSIS — R030 Elevated blood-pressure reading, without diagnosis of hypertension: Secondary | ICD-10-CM | POA: Diagnosis not present

## 2018-07-06 DIAGNOSIS — I700 Atherosclerosis of aorta: Secondary | ICD-10-CM | POA: Diagnosis not present

## 2018-07-06 DIAGNOSIS — D649 Anemia, unspecified: Secondary | ICD-10-CM | POA: Diagnosis not present

## 2018-07-06 DIAGNOSIS — E038 Other specified hypothyroidism: Secondary | ICD-10-CM | POA: Diagnosis not present

## 2018-07-06 DIAGNOSIS — K5900 Constipation, unspecified: Secondary | ICD-10-CM | POA: Diagnosis not present

## 2018-09-13 DIAGNOSIS — Z20828 Contact with and (suspected) exposure to other viral communicable diseases: Secondary | ICD-10-CM | POA: Diagnosis not present

## 2018-10-18 DIAGNOSIS — Z20828 Contact with and (suspected) exposure to other viral communicable diseases: Secondary | ICD-10-CM | POA: Diagnosis not present

## 2018-10-18 DIAGNOSIS — R5383 Other fatigue: Secondary | ICD-10-CM | POA: Diagnosis not present

## 2018-11-22 ENCOUNTER — Other Ambulatory Visit: Payer: Self-pay | Admitting: Internal Medicine

## 2018-11-22 ENCOUNTER — Ambulatory Visit
Admission: RE | Admit: 2018-11-22 | Discharge: 2018-11-22 | Disposition: A | Discharge status: Home / Self Care | Payer: Medicare HMO | Source: Ambulatory Visit | Attending: Internal Medicine | Admitting: Internal Medicine

## 2018-11-22 ENCOUNTER — Other Ambulatory Visit: Payer: Medicare HMO

## 2018-11-22 ENCOUNTER — Other Ambulatory Visit: Payer: Self-pay

## 2018-11-22 DIAGNOSIS — R1030 Lower abdominal pain, unspecified: Secondary | ICD-10-CM

## 2018-11-22 DIAGNOSIS — N3090 Cystitis, unspecified without hematuria: Secondary | ICD-10-CM | POA: Diagnosis not present

## 2018-11-22 DIAGNOSIS — R109 Unspecified abdominal pain: Secondary | ICD-10-CM | POA: Diagnosis not present

## 2018-11-22 MED ORDER — IOPAMIDOL (ISOVUE-300) INJECTION 61%
100.0000 mL | Freq: Once | INTRAVENOUS | Status: AC | PRN
  Administered 2018-11-22: 100 mL via INTRAVENOUS

## 2018-11-28 DIAGNOSIS — M47816 Spondylosis without myelopathy or radiculopathy, lumbar region: Secondary | ICD-10-CM | POA: Diagnosis not present

## 2018-12-16 DIAGNOSIS — Z23 Encounter for immunization: Secondary | ICD-10-CM | POA: Diagnosis not present

## 2018-12-19 DIAGNOSIS — I10 Essential (primary) hypertension: Secondary | ICD-10-CM | POA: Diagnosis not present

## 2018-12-19 DIAGNOSIS — M47816 Spondylosis without myelopathy or radiculopathy, lumbar region: Secondary | ICD-10-CM | POA: Diagnosis not present

## 2018-12-19 DIAGNOSIS — M48061 Spinal stenosis, lumbar region without neurogenic claudication: Secondary | ICD-10-CM | POA: Diagnosis not present

## 2019-01-01 DIAGNOSIS — Z111 Encounter for screening for respiratory tuberculosis: Secondary | ICD-10-CM | POA: Diagnosis not present

## 2019-01-09 ENCOUNTER — Other Ambulatory Visit: Payer: Self-pay

## 2019-01-09 DIAGNOSIS — Z20822 Contact with and (suspected) exposure to covid-19: Secondary | ICD-10-CM

## 2019-01-09 DIAGNOSIS — Z20828 Contact with and (suspected) exposure to other viral communicable diseases: Secondary | ICD-10-CM | POA: Diagnosis not present

## 2019-01-11 LAB — NOVEL CORONAVIRUS, NAA: SARS-CoV-2, NAA: NOT DETECTED

## 2019-01-18 ENCOUNTER — Encounter: Payer: Self-pay | Admitting: Gastroenterology

## 2019-01-18 DIAGNOSIS — E038 Other specified hypothyroidism: Secondary | ICD-10-CM | POA: Diagnosis not present

## 2019-01-18 DIAGNOSIS — R519 Headache, unspecified: Secondary | ICD-10-CM | POA: Diagnosis not present

## 2019-01-18 DIAGNOSIS — E538 Deficiency of other specified B group vitamins: Secondary | ICD-10-CM | POA: Diagnosis not present

## 2019-01-18 DIAGNOSIS — G4700 Insomnia, unspecified: Secondary | ICD-10-CM | POA: Diagnosis not present

## 2019-01-18 DIAGNOSIS — M859 Disorder of bone density and structure, unspecified: Secondary | ICD-10-CM | POA: Diagnosis not present

## 2019-01-18 DIAGNOSIS — R69 Illness, unspecified: Secondary | ICD-10-CM | POA: Diagnosis not present

## 2019-01-18 DIAGNOSIS — R030 Elevated blood-pressure reading, without diagnosis of hypertension: Secondary | ICD-10-CM | POA: Diagnosis not present

## 2019-01-22 DIAGNOSIS — R82998 Other abnormal findings in urine: Secondary | ICD-10-CM | POA: Diagnosis not present

## 2019-01-23 DIAGNOSIS — Z1212 Encounter for screening for malignant neoplasm of rectum: Secondary | ICD-10-CM | POA: Diagnosis not present

## 2019-01-25 DIAGNOSIS — K5900 Constipation, unspecified: Secondary | ICD-10-CM | POA: Diagnosis not present

## 2019-01-25 DIAGNOSIS — M1990 Unspecified osteoarthritis, unspecified site: Secondary | ICD-10-CM | POA: Diagnosis not present

## 2019-01-25 DIAGNOSIS — Z1331 Encounter for screening for depression: Secondary | ICD-10-CM | POA: Diagnosis not present

## 2019-01-25 DIAGNOSIS — M4186 Other forms of scoliosis, lumbar region: Secondary | ICD-10-CM | POA: Diagnosis not present

## 2019-01-25 DIAGNOSIS — R519 Headache, unspecified: Secondary | ICD-10-CM | POA: Diagnosis not present

## 2019-01-25 DIAGNOSIS — N3090 Cystitis, unspecified without hematuria: Secondary | ICD-10-CM | POA: Diagnosis not present

## 2019-01-25 DIAGNOSIS — M5117 Intervertebral disc disorders with radiculopathy, lumbosacral region: Secondary | ICD-10-CM | POA: Diagnosis not present

## 2019-01-25 DIAGNOSIS — Z0000 Encounter for general adult medical examination without abnormal findings: Secondary | ICD-10-CM | POA: Diagnosis not present

## 2019-01-25 DIAGNOSIS — Z111 Encounter for screening for respiratory tuberculosis: Secondary | ICD-10-CM | POA: Diagnosis not present

## 2019-01-25 DIAGNOSIS — R69 Illness, unspecified: Secondary | ICD-10-CM | POA: Diagnosis not present

## 2019-01-25 DIAGNOSIS — K862 Cyst of pancreas: Secondary | ICD-10-CM | POA: Diagnosis not present

## 2019-01-29 ENCOUNTER — Encounter (INDEPENDENT_AMBULATORY_CARE_PROVIDER_SITE_OTHER): Payer: Self-pay

## 2019-02-22 ENCOUNTER — Other Ambulatory Visit: Payer: Self-pay

## 2019-02-22 DIAGNOSIS — Z20822 Contact with and (suspected) exposure to covid-19: Secondary | ICD-10-CM

## 2019-02-24 LAB — NOVEL CORONAVIRUS, NAA: SARS-CoV-2, NAA: NOT DETECTED

## 2019-03-13 ENCOUNTER — Other Ambulatory Visit: Payer: Self-pay | Admitting: Internal Medicine

## 2019-03-13 DIAGNOSIS — K862 Cyst of pancreas: Secondary | ICD-10-CM

## 2019-03-20 DIAGNOSIS — R69 Illness, unspecified: Secondary | ICD-10-CM | POA: Diagnosis not present

## 2019-04-02 ENCOUNTER — Ambulatory Visit
Admission: RE | Admit: 2019-04-02 | Discharge: 2019-04-02 | Disposition: A | Discharge status: Home / Self Care | Payer: Medicare HMO | Source: Ambulatory Visit | Attending: Internal Medicine | Admitting: Internal Medicine

## 2019-04-02 DIAGNOSIS — R932 Abnormal findings on diagnostic imaging of liver and biliary tract: Secondary | ICD-10-CM | POA: Diagnosis not present

## 2019-04-02 DIAGNOSIS — K862 Cyst of pancreas: Secondary | ICD-10-CM | POA: Diagnosis not present

## 2019-04-02 DIAGNOSIS — N281 Cyst of kidney, acquired: Secondary | ICD-10-CM | POA: Diagnosis not present

## 2019-04-02 MED ORDER — GADOBENATE DIMEGLUMINE 529 MG/ML IV SOLN
15.0000 mL | Freq: Once | INTRAVENOUS | Status: AC | PRN
  Administered 2019-04-02: 11:00:00 15 mL via INTRAVENOUS

## 2019-04-03 ENCOUNTER — Telehealth: Payer: Self-pay | Admitting: Internal Medicine

## 2019-04-03 ENCOUNTER — Telehealth: Payer: Self-pay | Admitting: *Deleted

## 2019-04-03 NOTE — Telephone Encounter (Signed)
Dr Rush Landmark- Would you please take a look at MR/MRCP completed 04/02/19 as ordered by Dr Virgina Jock (in [place])? Ms. Mcferran is a Dr Hilarie Fredrickson patient but Dr Hilarie Fredrickson is out of the office until 04/10/19. Dr Keane Police office has sent MRCP over for our review and it appears that EUS may be needed.... Dr Ardis Hughs is also out this week.  Thank you for any advice you can give!

## 2019-04-03 NOTE — Telephone Encounter (Signed)
DS, I have reviewed the imaging and prior CT. Do you have Dr. Keane Police most recent notes? Do you know why the MRICP was performed earlier than previous notation on prior CT scan? Is patient having new symptoms that are different than her prior? I think a conversation with the patient via a Telehealth visit would be helpful with Dr. Hilarie Fredrickson or one of the Pas, to go over patient's current symptoms if anything has changed and then proceed with scheduling an EUS. The cyst enlargement from just a few months ago would be reason to evaluate via EUS and potentially sample. We can arrange an EUS within the next 3-4 weeks, but I think someone should talk with her first to ensure she understands the role of EUS and the risks associated with it. Would Dr. Hilarie Fredrickson or one of the Pas have an opportunity to talk with patient in the next couple of weeks. I can get her on the schedule tentatively for 2-3 weeks from now, if she agrees to move forward with the EUS. Please let me know what she thinks after you talk with her and then we can work on coordinating procedure. GM

## 2019-04-04 ENCOUNTER — Other Ambulatory Visit: Payer: Self-pay

## 2019-04-04 ENCOUNTER — Other Ambulatory Visit: Payer: Self-pay | Admitting: Internal Medicine

## 2019-04-04 ENCOUNTER — Ambulatory Visit
Admission: RE | Admit: 2019-04-04 | Discharge: 2019-04-04 | Disposition: A | Discharge status: Home / Self Care | Payer: Medicare HMO | Source: Ambulatory Visit | Attending: Internal Medicine | Admitting: Internal Medicine

## 2019-04-04 DIAGNOSIS — Z1231 Encounter for screening mammogram for malignant neoplasm of breast: Secondary | ICD-10-CM

## 2019-04-04 NOTE — Telephone Encounter (Signed)
I have spoken to patient who indicates that originally in August 2020 when she went for CT, she had some abdominal discomfort which prompted the scan. At that time, she was found to have pancreatic tail lesion. She indicates she has not had any symptoms now for quite sometime now. Says that the reason MRCP was completed prior to the 2 year mark as suggested is that Dr Virgina Jock thought she needed to go ahead and have the MRCP without waiting so long, so she went forward with the test at his request. Patient states that she is glad to come in to talk but "doesn't think she has much to add" in regards to symptomatology etc. She is very much agreeable to EUS. She also notes that she is moving to [facility] next week so she is trying to get her medical issues taken care of as soon as possible.  Dr Rush Landmark, Im glad to go ahead and place on extenders' schedule if you still find appropriate. Just let me know. I have placed Dr Keane Police records on patient in your office inbox for review if you'd like to take a look. Also, do you want me to go ahead and try to reserve an EUS spot for her?

## 2019-04-04 NOTE — Telephone Encounter (Signed)
See previous phone encounter.

## 2019-04-04 NOTE — Telephone Encounter (Signed)
I called and spoke with the patient this afternoon. We discussed the changes in her imaging over the course of the last 4 months with a rapid size increase of almost 1 cm in size compared to prior.  Even though in 2017 there was mention of a prior cyst because of the size differential in such a short period in time I do think an endoscopic ultrasound is reasonable. I discussed the risks of the procedure of an EUS.  The risks of EUS including bleeding, infection, aspiration pneumonia and intestinal perforation were discussed as was the possibility it may not give a definitive diagnosis.  If a biopsy of the pancreas is done as part of the EUS, there is an additional risk of pancreatitis at the rate of about 1%.  It was explained that procedure related pancreatitis is typically mild, although can be severe and even life threatening, which is why we do not perform random pancreatic biopsies and only biopsy a lesion we feel is concerning enough to warrant the risk. I think it is worthwhile for [place] to consider an EUS with fine-needle aspiration potentially.  However, the patient has multiple medical allergies to many of the normal antibiotics that we would give.  It seems like she has tolerated Ancef in the past but otherwise she does not feel comfortable with antibiotics if Dr. Virgina Jock has not the okay to the 2 types of antibiotics that he has given her or feels comfortable getting her. I does think that an EUS should be held until we can determine what antibiotic could be administered to her safely.  If it is a second or third generation cephalosporin then we certainly could give that during an EUS and give an oral dose of the medication after to help prevent cyst infection if sampling is performed. Thus I propose the following:  1) we will send this message to Dr. Virgina Jock to help [place] understand what antibiotics she can be given since she states he is the only person who knows what medicines/antibiotics she can  tolerate 2) once we have established that I think an EUS with possible FNA can be performed and we should be able to get that scheduled within the next 3 to 4 weeks thus I would wait until we get this answer before scheduling 3) we will update Dr. Hilarie Fredrickson and my partner Dr. Ardis Hughs who also performs EUS 4) patient appreciative for the call back and wants to try to get this done if possible but also understands the need for safety in the setting of her multiple medication allergies 5) Dottie, once we have this information we can plan on proceeding with EUS scheduled    Justice Britain, MD [facility] Advanced Endoscopy Office # [phone]

## 2019-04-04 NOTE — Telephone Encounter (Signed)
Note has been sent to Dr Virgina Jock. I will follow up tomorrow at some point to make certain that they have received this information.

## 2019-04-05 NOTE — Telephone Encounter (Signed)
Refaxed note to [phone] as staff indicates this is a more appropriate way to contact Dr Virgina Jock.

## 2019-04-09 NOTE — Telephone Encounter (Signed)
Left message on clinical phone line requesting call/fax back regarding what antibiotics patient may have so we can move forward with EUS scheduling.

## 2019-04-09 NOTE — Telephone Encounter (Signed)
Thanks to all involved during absence Very appreciative to Dr. Rush Landmark who went above and beyond to contact the patient while I was away  It seems we have antibiotic info from Dr. Virgina Jock, we will need to determine with these options would provide the needed coverage.  I agree with EUS once we know which antibiotic to use

## 2019-04-09 NOTE — Telephone Encounter (Signed)
We have received fax back from Dr Keane Police office with a note from Dr Virgina Jock indicating "A dose of IV or IM gentamycin may cork here. Biaxin/Clarithromycin (with Cola) or Azithromycin Oral also may work as I do not have any prior reactions to these. "  Original note is placed on Dr [employer] desk for review.Marland KitchenMarland KitchenMarland Kitchen

## 2019-04-10 NOTE — Telephone Encounter (Signed)
There is a separate notation/discussion pending evaluation with Infectious disease in regards to overall antibiotic use. Hold on scheduling for now EUS. GM

## 2019-04-16 NOTE — Telephone Encounter (Signed)
Any updates regarding this patient's EUS?

## 2019-04-16 NOTE — Telephone Encounter (Signed)
We have heard back from Dr. Baxter Flattery with [facility] regarding possible use of meropenem for EUS. I have sent this info back to Dr. Ardis Hughs and [place] and awaiting their opinion, thus I expect to know today or tomorrow

## 2019-04-17 ENCOUNTER — Telehealth: Payer: Self-pay

## 2019-04-17 DIAGNOSIS — K862 Cyst of pancreas: Secondary | ICD-10-CM

## 2019-04-17 NOTE — Telephone Encounter (Signed)
----- Message from Milus Banister, MD sent at 04/17/2019  6:47 AM EST ----- Regarding: RE: Patient for EUS I think carbapenem during the case and NO oral post procedure is probably safe.  Some groups do not given any antibiotics for EUS of pancreatic lesions (see current [employer] article).  I think we can get her on for EUS.  Rynlee Lisbon, Please reach out to her to arrange upper EUS, myself or Dr. Rush Landmark first available time for enlarging pancreatic cyst.  Alert [facility] endo that she will need carabenum V during the procedure and that Dr. Baxter Flattery has ok'd its use.  I would consider her Category 2 if that needs to be entered yet.  Thanks ----- Message ----- From: Irving Copas., MD Sent: 04/16/2019   4:38 PM EST To: Milus Banister, MD, Jerene Bears, MD Subject: RE: Patient for EUS                            DJ, I would usually want to prophylax a patient for a few days after aspiration. I hadn't heard anything back from [place] on my last reply about any antibiotics we could give post-aspiration. Would you be OK with just giving the Carbapenem during aspiration? If you are OK then we can get her scheduled. Let me know DJ.Thanks. GM ----- Message ----- From: Jerene Bears, MD Sent: 04/16/2019  10:50 AM EST To: Milus Banister, MD, # Subject: RE: Patient for EUS                            Hi Dan and Gabe Just closing this loop. Carlyle Basques weighed in with her rec for meropenem.  Would this suffice from your end and if so would either of you be able to schedule her EUS Thanks JMP ----- Message ----- From: Carlyle Basques, MD Sent: 04/13/2019   4:16 PM EST To: Milus Banister, MD, Jerene Bears, MD, # Subject: RE: Patient for EUS                            Looking at her allergies, she could tolerate a dose of carbapenem. If you were to do pre-op abtx, I would do meropenem but not necessarily give her more than 1 dose ----- Message ----- From: Milus Banister, MD Sent: 04/10/2019    8:05 AM EST To: Jerene Bears, MD, Carlyle Basques, MD, # Subject: RE: Patient for EUS                            Gabe, I agree that EUS is a good next step especially given the slight enhancement noted in the lesion on her recent MRI.  I am not sure what the best antibiotic regimen will be and so I am going to reach out to infectious disease, Dr. Baxter Flattery for a curbside.  Caren Griffins, Please see her recent phone note for the most detail on this.  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  Generally we prescribe Cipro IV during the case and 3 days worth of oral Cipro afterward.  She has numerous allergies and we are having difficulty deciding on antibiotic regimen.  Note that although one of our major endoscopy societies recommends antibiotic coverage for every pancreatic cyst needle  aspiration, some authors do not.  A recent [employer] article stated specifically that they do not empirically give all patients antibiotics for pancreatic cystic lesion aspiration.  Would this be an appropriate patient for 1 of you guys to see in the ID clinic to help with antibiotic recommendations?  thanks  DJ  ----- Message ----- From: Irving Copas., MD Sent: 04/10/2019   4:07 AM EST To: Milus Banister, MD Subject: Patient for EUS                                Dorina Hoyer you are well.You probably have seen the messages surrounding this patient.  Basically, patient of [place] who has a significantly enlarging cyst within the last few months.  No real symptoms evident had some abdominal pain earlier this year.  She had a prior image in 2017 that had suggested a small cyst but that had been noted really in the documentation.  However the cyst has grown greater than a centimeter in size and just 3 months.I think an EUS is reasonable with FNA to evaluate for mucinous vs serous nature.Looking at the patient's chart it also looks like she is obtained and been on Keflex in the past  for a UTI does not seem to have that on her allergies list.Not sure that gentamicin or clarithromycin or azithromycin would have true GNR coverage in case we end up doing an EUS and given aspiration as per her PCP.I would suspect getting her a third-generation cephalosporin during the procedure as well as potentially giving her Keflex for home after an aspiration. What you think about the antibiotics and EUS needs?Thanks.GM

## 2019-04-18 NOTE — Telephone Encounter (Signed)
Okay to offer it if it is still available otherwise next available with DJ or myself is fine. Thanks. GM

## 2019-04-18 NOTE — Telephone Encounter (Signed)
Dr Rush Landmark  can I offer the pt 04/26/19 at 1215 pm if it is still available?

## 2019-04-19 ENCOUNTER — Other Ambulatory Visit: Payer: Self-pay

## 2019-04-19 DIAGNOSIS — K862 Cyst of pancreas: Secondary | ICD-10-CM

## 2019-04-19 NOTE — Telephone Encounter (Signed)
See 04/17/19 telephone note.

## 2019-04-19 NOTE — Telephone Encounter (Signed)
The pt has been scheduled for EUS on 05/02/19 at [facility] with Dr Rush Landmark.  The pt has been advised and instructed.  COVID test also scheduled.  Instructions mailed.

## 2019-04-28 ENCOUNTER — Other Ambulatory Visit (HOSPITAL_COMMUNITY)
Admission: RE | Admit: 2019-04-28 | Discharge: 2019-04-28 | Disposition: A | Discharge status: Home / Self Care | Payer: Medicare HMO | Source: Ambulatory Visit | Attending: Gastroenterology | Admitting: Gastroenterology

## 2019-04-28 DIAGNOSIS — Z20822 Contact with and (suspected) exposure to covid-19: Secondary | ICD-10-CM | POA: Insufficient documentation

## 2019-04-28 DIAGNOSIS — Z01812 Encounter for preprocedural laboratory examination: Secondary | ICD-10-CM | POA: Insufficient documentation

## 2019-04-28 LAB — SARS CORONAVIRUS 2 (TAT 6-24 HRS): SARS Coronavirus 2: NEGATIVE

## 2019-04-30 ENCOUNTER — Telehealth: Payer: Self-pay | Admitting: Gastroenterology

## 2019-04-30 NOTE — Telephone Encounter (Signed)
Patient has questions about medications before her EUS. Please call him.

## 2019-05-01 NOTE — Telephone Encounter (Signed)
Left message on machine to call back  

## 2019-05-01 NOTE — Telephone Encounter (Signed)
The pt asked if she could take her BP meds the morning of the procedure. The pt was advised she could take it with a sip of water the morning of the procedure.

## 2019-05-01 NOTE — Telephone Encounter (Signed)
Pt called returning your call 

## 2019-05-02 ENCOUNTER — Other Ambulatory Visit: Payer: Self-pay

## 2019-05-02 ENCOUNTER — Ambulatory Visit (HOSPITAL_COMMUNITY)
Admission: RE | Admit: 2019-05-02 | Discharge: 2019-05-02 | Disposition: A | Discharge status: Home / Self Care | Payer: Medicare HMO | Attending: Gastroenterology | Admitting: Gastroenterology

## 2019-05-02 ENCOUNTER — Ambulatory Visit (HOSPITAL_COMMUNITY): Payer: Medicare HMO | Admitting: Certified Registered Nurse Anesthetist

## 2019-05-02 ENCOUNTER — Encounter (HOSPITAL_COMMUNITY): Payer: Self-pay | Admitting: Gastroenterology

## 2019-05-02 ENCOUNTER — Encounter (HOSPITAL_COMMUNITY)
Admission: RE | Disposition: A | Discharge status: Home / Self Care | Payer: Self-pay | Source: Home / Self Care | Attending: Gastroenterology

## 2019-05-02 DIAGNOSIS — Z8249 Family history of ischemic heart disease and other diseases of the circulatory system: Secondary | ICD-10-CM | POA: Diagnosis not present

## 2019-05-02 DIAGNOSIS — K209 Esophagitis, unspecified without bleeding: Secondary | ICD-10-CM | POA: Diagnosis not present

## 2019-05-02 DIAGNOSIS — K219 Gastro-esophageal reflux disease without esophagitis: Secondary | ICD-10-CM | POA: Insufficient documentation

## 2019-05-02 DIAGNOSIS — K3189 Other diseases of stomach and duodenum: Secondary | ICD-10-CM | POA: Insufficient documentation

## 2019-05-02 DIAGNOSIS — K449 Diaphragmatic hernia without obstruction or gangrene: Secondary | ICD-10-CM | POA: Diagnosis not present

## 2019-05-02 DIAGNOSIS — K862 Cyst of pancreas: Secondary | ICD-10-CM | POA: Insufficient documentation

## 2019-05-02 DIAGNOSIS — I10 Essential (primary) hypertension: Secondary | ICD-10-CM | POA: Insufficient documentation

## 2019-05-02 DIAGNOSIS — E213 Hyperparathyroidism, unspecified: Secondary | ICD-10-CM | POA: Diagnosis not present

## 2019-05-02 DIAGNOSIS — Z96653 Presence of artificial knee joint, bilateral: Secondary | ICD-10-CM | POA: Diagnosis not present

## 2019-05-02 DIAGNOSIS — Z880 Allergy status to penicillin: Secondary | ICD-10-CM | POA: Insufficient documentation

## 2019-05-02 DIAGNOSIS — K317 Polyp of stomach and duodenum: Secondary | ICD-10-CM | POA: Diagnosis not present

## 2019-05-02 DIAGNOSIS — M1990 Unspecified osteoarthritis, unspecified site: Secondary | ICD-10-CM | POA: Diagnosis not present

## 2019-05-02 DIAGNOSIS — Z881 Allergy status to other antibiotic agents status: Secondary | ICD-10-CM | POA: Insufficient documentation

## 2019-05-02 DIAGNOSIS — Z888 Allergy status to other drugs, medicaments and biological substances status: Secondary | ICD-10-CM | POA: Insufficient documentation

## 2019-05-02 DIAGNOSIS — Z87891 Personal history of nicotine dependence: Secondary | ICD-10-CM | POA: Insufficient documentation

## 2019-05-02 HISTORY — PX: FINE NEEDLE ASPIRATION: SHX5430

## 2019-05-02 HISTORY — PX: EUS: SHX5427

## 2019-05-02 HISTORY — PX: ESOPHAGOGASTRODUODENOSCOPY (EGD) WITH PROPOFOL: SHX5813

## 2019-05-02 SURGERY — UPPER ENDOSCOPIC ULTRASOUND (EUS) RADIAL
Anesthesia: Monitor Anesthesia Care

## 2019-05-02 MED ORDER — PROPOFOL 500 MG/50ML IV EMUL
INTRAVENOUS | Status: DC | PRN
  Administered 2019-05-02: 150 ug/kg/min via INTRAVENOUS

## 2019-05-02 MED ORDER — LACTATED RINGERS IV SOLN
INTRAVENOUS | Status: DC
  Administered 2019-05-02: 1000 mL via INTRAVENOUS

## 2019-05-02 MED ORDER — SODIUM CHLORIDE 0.9 % IV SOLN
1.0000 g | Freq: Once | INTRAVENOUS | Status: AC
  Administered 2019-05-02: 1 g via INTRAVENOUS
  Filled 2019-05-02: qty 1

## 2019-05-02 MED ORDER — ONDANSETRON HCL 4 MG/2ML IJ SOLN
INTRAMUSCULAR | Status: DC | PRN
  Administered 2019-05-02: 4 mg via INTRAVENOUS

## 2019-05-02 MED ORDER — LIDOCAINE HCL URETHRAL/MUCOSAL 2 % EX GEL
CUTANEOUS | Status: AC
  Filled 2019-05-02: qty 10

## 2019-05-02 MED ORDER — LIDOCAINE 2% (20 MG/ML) 5 ML SYRINGE
INTRAMUSCULAR | Status: DC | PRN
  Administered 2019-05-02: 60 mg via INTRAVENOUS

## 2019-05-02 MED ORDER — SODIUM CHLORIDE 0.9 % IV SOLN: INTRAVENOUS | Status: DC

## 2019-05-02 MED ORDER — PROPOFOL 10 MG/ML IV BOLUS
INTRAVENOUS | Status: DC | PRN
  Administered 2019-05-02: 20 mg via INTRAVENOUS
  Administered 2019-05-02: 50 mg via INTRAVENOUS
  Administered 2019-05-02: 10 mg via INTRAVENOUS
  Administered 2019-05-02: 30 mg via INTRAVENOUS
  Administered 2019-05-02 (×3): 20 mg via INTRAVENOUS

## 2019-05-02 MED ORDER — PROPOFOL 500 MG/50ML IV EMUL
INTRAVENOUS | Status: AC
  Filled 2019-05-02: qty 50

## 2019-05-02 SURGICAL SUPPLY — 14 items

## 2019-05-02 NOTE — Op Note (Addendum)
[facility] Patient Name: Pamela Snow Procedure Date: 05/02/2019 MRN: [id number] Attending MD: Justice Britain , MD Date of Birth: 1940-10-03 CSN: [id number] Age: 79 Admit Type: Outpatient Procedure:                Upper EUS Indications:              Pancreatic cyst on CT scan, Enlarging Pancreatic                            cyst on MRCP follow up - no prior Pancreatitis Providers:                Justice Britain, MD, Cleda Daub, RN,                            William Dalton, Technician Referring MD:             Lajuan Lines. Hilarie Fredrickson, MD, Shon Baton MD, MD Medicines:                Monitored Anesthesia Care, Meropenem 1 g (as per                            discussion with [facility] ID) Complications:            No immediate complications. Estimated Blood Loss:     Estimated blood loss was minimal. Procedure:                Pre-Anesthesia Assessment:                           - Prior to the procedure, a History and Physical                            was performed, and patient medications and                            allergies were reviewed. The patient's tolerance of                            previous anesthesia was also reviewed. The risks                            and benefits of the procedure and the sedation                            options and risks were discussed with the patient.                            All questions were answered, and informed consent                            was obtained. Prior Anticoagulants: The patient has                            taken no previous anticoagulant or antiplatelet  agents. ASA Grade Assessment: III - A patient with                            severe systemic disease. After reviewing the risks                            and benefits, the patient was deemed in                            satisfactory condition to undergo the procedure.                           After obtaining informed  consent, the endoscope was                            passed under direct vision. Throughout the                            procedure, the patient's blood pressure, pulse, and                            oxygen saturations were monitored continuously. The                            [id number]-H190 ([id number]) Olympus gastroscope was                            introduced through the mouth, and advanced to the                            second part of duodenum. The [id number]-[id number] ([id number])                            Olympus duodenoscope was introduced through the                            mouth, and advanced to the second part of duodenum.                            The [id number] ([id number]) Olympus Linear EUS was                            introduced through the mouth, and advanced to the                            duodenum for ultrasound examination from the                            stomach and duodenum. The upper EUS was                            accomplished without difficulty. The patient  tolerated the procedure. Scope In: Scope Out: Findings:      ENDOSCOPIC FINDING: :      No gross lesions were noted in the entire esophagus.      Multiple small sessile polyps with no bleeding and no stigmata of recent       bleeding were found in the gastric fundus and in the gastric body -       consistent with previously noted fundic gland polyps.      Patchy mildly erythematous mucosa without bleeding was found in the       gastric body and in the gastric antrum - previously biopsied and       negative for HP.      No other gross lesions were noted in the entire examined stomach.      No gross lesions were noted in the duodenal bulb, in the first portion       of the duodenum and in the second portion of the duodenum.      The major papilla was normal (found under a hood).      ENDOSONOGRAPHIC FINDING: :      An irregular lesion was identified in the very distal pancreatic tail,        adjacent to the spleen and splenic hilum vessels. The lesion had a       component of hypoechogenicity as well as anechoic cystic. It measured at       least 17 mm by 10 mm, with the cystic component being 11 mm x 10 mm. The       outer margins were irregular. There was sonographic evidence of the       cystic component abutting the splenic artery. The remainder of the       pancreas was examined as noted below. I tried multiple times to find a       window to try and obtain fluid from the cystic component as an       aspiration, but could not find a window safely with the splenic hilar       vessels being in place. The region just distal to this area that was       more hypoechoic however had a small window for sampling purposes. Fine       needle biopsy was performed. Color Doppler imaging was utilized prior to       needle puncture to confirm a lack of significant vascular structures       within the needle path. Four passes were made with the Acquire 22 gauge       ultrasound core biopsy needle using a transgastric approach. Visible       cores of tissue were obtained. Final cytology results are pending. After       the FNB, I tried to see if I could aspirate the cystic area, but I was       not able to.      Endosonographic imaging in the pancreatic head (1.5 mm), genu of the       pancreas (2.1 mm), pancreatic body (0.5 mm), pancreatic tail (0.7 mm),       and main pancreatic duct showed no abnormalities. The endosonographic       appearance of parenchyma and the upstream pancreatic duct indicated no       ductal or parenchymal calcifications and no parenchymal atrophy.      There was no sign of significant endosonographic abnormality in the  common bile duct. The maximum diameter of the duct was 4.1 mm. No       masses, no stones, no biliary sludge and ducts of normal caliber were       identified.      Endosonographic imaging of the ampulla showed no intramural        (subepithelial) lesion.      Endosonographic imaging in the visualized portion of the liver showed no       mass.      No malignant-appearing lymph nodes were visualized in the celiac region       (level 20), perigastric region and peripancreatic region.      The celiac region was visualized. Impression:               EGD Impression:                           - No gross lesions in esophagus.                           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                           - Erythematous mucosa in the gastric body and                            antrum.                           - No other gross lesions in the stomach.                           - No gross lesions in the duodenal bulb, in the                            first portion of the duodenum and in the second                            portion of the duodenum.                           - Normal major papilla (found under a hood).                           EUS Impression:                           - A mixed hypoechogenic/anechoic lesion was                            identified in the very distal pancreatic tail                            adjacent to the splenic hilum. Fine needle biopsy  performed of the hypoechogenic region but the                            cystic component could not be aspirated due to the                            splenic vessels in the region. The rest of the                            pancreas had an normal endosonographic appearance                            of parenchyma as well as the downstream pancreatic                            duct.                           - There was no sign of significant pathology in the                            common bile duct.                           - No malignant-appearing lymph nodes were                            visualized in the celiac region (level 20),                             perigastric region and peripancreatic region. Moderate Sedation:      Not Applicable - Patient had care per Anesthesia. Recommendation:           - The patient will be observed post-procedure,                            until all discharge criteria are met.                           - Discharge patient to home.                           - Patient has a contact number available for                            emergencies. The signs and symptoms of potential                            delayed complications were discussed with the                            patient. Return to normal activities tomorrow.                            Written discharge  instructions were provided to the                            patient.                           - Low fat diet for 1 week.                           - Monitor for signs/symptoms of pancreatitis,                            bleeding, perforation, and infection. If issues                            please call our number to get further assistance as                            needed.                           - Had prior discussion with ID and with other                            Advanced Endoscopy proceduralist and with patient,                            that post-FNA/FNB we would hold on other                            antibiotics due to her multiple antibiotic                            allergies.                           - Await cytology results.                           - If unremarkable findings then would likely plan a                            follow up MRI/MRCP in 40-month time. If atypical or                            abnormal cells, then may consider repeat EUS                            (though remains higher risk since the splenic                            vessels are within the way of the region.                           - The findings and recommendations were discussed  with the patient.                            - The findings and recommendations were discussed                            with the patient's family. Procedure Code(s):        --- Professional ---                           [phone], Esophagogastroduodenoscopy, flexible,                            transoral; with transendoscopic ultrasound-guided                            intramural or transmural fine needle                            aspiration/biopsy(s), (includes endoscopic                            ultrasound examination limited to the esophagus,                            stomach or duodenum, and adjacent structures) Diagnosis Code(s):        --- Professional ---                           K31.7, Polyp of stomach and duodenum                           K31.89, Other diseases of stomach and duodenum                           K86.89, Other specified diseases of pancreas                           I89.9, Noninfective disorder of lymphatic vessels                            and lymph nodes, unspecified                           K86.2, Cyst of pancreas CPT copyright 2019 American [employer] Association. All rights reserved. The codes documented in this report are preliminary and upon coder review may  be revised to meet current compliance requirements. Justice Britain, MD 05/02/2019 12:40:53 PM Number of Addenda: 0

## 2019-05-02 NOTE — Transfer of Care (Signed)
Immediate Anesthesia Transfer of Care Note  Patient: Pollie Meyer  Procedure(s) Performed: UPPER ENDOSCOPIC ULTRASOUND (EUS) RADIAL (N/A ) ESOPHAGOGASTRODUODENOSCOPY (EGD) WITH PROPOFOL (N/A ) FINE NEEDLE ASPIRATION (FNA) LINEAR  Patient Location: PACU and Endoscopy Unit  Anesthesia Type:MAC  Level of Consciousness: awake, alert  and oriented  Airway & Oxygen Therapy: Patient Spontanous Breathing and Patient connected to face mask oxygen  Post-op Assessment: Report given to RN and Post -op Vital signs reviewed and stable  Post vital signs: Reviewed and stable  Last Vitals:  Vitals Value Taken Time  BP    Temp    Pulse 64 05/02/19 1216  Resp 17 05/02/19 1216  SpO2 100 % 05/02/19 1216  Vitals shown include unvalidated device data.  Last Pain:  Vitals:   05/02/19 0955  TempSrc: Oral  PainSc: 0-No pain         Complications: No apparent anesthesia complications

## 2019-05-02 NOTE — Anesthesia Preprocedure Evaluation (Signed)
Anesthesia Evaluation  Patient identified by MRN, date of birth, ID band Patient awake    Reviewed: Allergy & Precautions, H&P , NPO status , Patient's Chart, lab work & pertinent test results  Airway Mallampati: II   Neck ROM: full    Dental   Pulmonary former smoker,    breath sounds clear to auscultation       Cardiovascular hypertension,  Rhythm:regular Rate:Normal     Neuro/Psych  Headaches,  Neuromuscular disease    GI/Hepatic hiatal hernia, GERD  ,  Endo/Other    Renal/GU      Musculoskeletal  (+) Arthritis ,   Abdominal   Peds  Hematology   Anesthesia Other Findings   Reproductive/Obstetrics                             Anesthesia Physical Anesthesia Plan  ASA: II  Anesthesia Plan: MAC   Post-op Pain Management:    Induction: Intravenous  PONV Risk Score and Plan: 2 and Propofol infusion and Treatment may vary due to age or medical condition  Airway Management Planned: Nasal Cannula  Additional Equipment:   Intra-op Plan:   Post-operative Plan:   Informed Consent: I have reviewed the patients History and Physical, chart, labs and discussed the procedure including the risks, benefits and alternatives for the proposed anesthesia with the patient or authorized representative who has indicated his/her understanding and acceptance.       Plan Discussed with: CRNA, Anesthesiologist and Surgeon  Anesthesia Plan Comments:         Anesthesia Quick Evaluation

## 2019-05-02 NOTE — Discharge Instructions (Signed)
YOU HAD AN ENDOSCOPIC PROCEDURE TODAY: Refer to the procedure report and other information in the discharge instructions given to you for any specific questions about what was found during the examination. If this information does not answer your questions, please call [place] office at [phone] to clarify.  ° °YOU SHOULD EXPECT: Some feelings of bloating in the abdomen. Passage of more gas than usual. Walking can help get rid of the air that was put into your GI tract during the procedure and reduce the bloating. If you had a lower endoscopy (such as a colonoscopy or flexible sigmoidoscopy) you may notice spotting of blood in your stool or on the toilet paper. Some abdominal soreness may be present for a day or two, also. ° °DIET: Your first meal following the procedure should be a light meal and then it is ok to progress to your normal diet. A half-sandwich or bowl of soup is an example of a good first meal. Heavy or fried foods are harder to digest and may make you feel nauseous or bloated. Drink plenty of fluids but you should avoid alcoholic beverages for 24 hours. If you had a esophageal dilation, please see attached instructions for diet.   ° °ACTIVITY: Your care partner should take you home directly after the procedure. You should plan to take it easy, moving slowly for the rest of the day. You can resume normal activity the day after the procedure however YOU SHOULD NOT DRIVE, use power tools, machinery or perform tasks that involve climbing or major physical exertion for 24 hours (because of the sedation medicines used during the test).  ° °SYMPTOMS TO REPORT IMMEDIATELY: °A gastroenterologist can be reached at any hour. Please call [phone]  for any of the following symptoms:  °Following lower endoscopy (colonoscopy, flexible sigmoidoscopy) °Excessive amounts of blood in the stool  °Significant tenderness, worsening of abdominal pains  °Swelling of the abdomen that is new, acute  °Fever of 100° or  higher  °Following upper endoscopy (EGD, EUS, ERCP, esophageal dilation) °Vomiting of blood or coffee ground material  °New, significant abdominal pain  °New, significant chest pain or pain under the shoulder blades  °Painful or persistently difficult swallowing  °New shortness of breath  °Black, tarry-looking or red, bloody stools ° °FOLLOW UP:  °If any biopsies were taken you will be contacted by phone or by letter within the next 1-3 weeks. Call [phone]  if you have not heard about the biopsies in 3 weeks.  °Please also call with any specific questions about appointments or follow up tests. ° °

## 2019-05-02 NOTE — H&P (Signed)
GASTROENTEROLOGY PROCEDURE H&P NOTE   Primary Care Physician: Shon Baton, MD  HPI: Pamela Snow is a 79 y.o. female who presents for EGD/EUS.  Past Medical History:  Diagnosis Date  . Adenomatous colon polyp   . Arthritis   . B12 deficiency   . Chronic constipation   . Diverticulitis   . Diverticulosis    hx of diverticulitis  . Gall stones   . Generalized headaches    states never had until trouble with parathyroidism  . GERD (gastroesophageal reflux disease)   . Hiatal hernia   . Hyperparathyroidism   . Hypertension   . Symptomatic cholelithiasis 05/26/2012   s/p cholecystectomy   Past Surgical History:  Procedure Laterality Date  . ABDOMINAL HYSTERECTOMY  2002  . CHOLECYSTECTOMY N/A 05/25/2012   Procedure: LAPAROSCOPIC CHOLECYSTECTOMY WITH INTRAOPERATIVE CHOLANGIOGRAM;  Surgeon: Leighton Ruff, MD;  Location: [facility];  Service: General;  Laterality: N/A;  unable to come before 12  . JOINT REPLACEMENT     bilateral  . LAPAROSCOPIC COLON RESECTION  04/2011   [facility];diverticullitis  . LEFT HEART CATHETERIZATION WITH CORONARY ANGIOGRAM N/A 10/12/2013   Procedure: LEFT HEART CATHETERIZATION WITH CORONARY ANGIOGRAM;  Surgeon: Sinclair Grooms, MD;  Location: [facility];  Service: Cardiovascular;  Laterality: N/A;  . PARATHYROIDECTOMY  03/12/2011   Procedure: PARATHYROIDECTOMY;  Surgeon: Earnstine Regal, MD;  Location: [facility];  Service: General;  Laterality: Left;  Left superior parathyroidectomy, minimally invasive   . TONSILLECTOMY    . TOTAL KNEE ARTHROPLASTY  2005   bilateral  . VAGINAL HYSTERECTOMY     Current Facility-Administered Medications  Medication Dose Route Frequency Provider Last Rate Last Admin  . 0.9 %  sodium chloride infusion   Intravenous Continuous Mansouraty, Telford Nab., MD      . lactated ringers infusion   Intravenous Continuous Mansouraty, Telford Nab., MD 10 mL/hr at 05/02/19 1003 1,000 mL at 05/02/19 1003   Allergies  Allergen  Reactions  . Ciprofloxacin     Serum sickness    REQUIRING HOSPITALIZATION  . Flagyl [Metronidazole Hcl]     Serum sickness/   REQUIRING HOSPITALIZATION  . Scopolamine Other (See Comments)    Pt states she becomes very confused and has vertigo with scopolamine patch  . Azactam [Aztreonam] Hives  . Clindamycin/Lincomycin Hives  . Penicillins Hives and Other (See Comments)    Thrush Did it involve swelling of the face/tongue/throat, SOB, or low BP? Yes Did it involve sudden or severe rash/hives, skin peeling, or any reaction on the inside of your mouth or nose? Unknown Did you need to seek medical attention at a hospital or doctor's office? No When did it last happen?childhood reaction (teenager) If all above answers are "NO", may proceed with cephalosporin use.   . Bactrim [Sulfamethoxazole-Trimethoprim] Swelling    swollen tongue  . Moviprep [Peg-Kcl-Nacl-Nasulf-Na Asc-C] Other (See Comments)    Pt's abdomen swells with the prep and she never "processes" the solution  . Scallops [Shellfish Allergy] Nausea And Vomiting  . Doxycycline Rash  . Gabapentin Hives and Rash   Family History  Problem Relation Age of Onset  . Colon cancer Mother   . Heart failure Mother   . Cancer Mother        colon  . Colon cancer Sister   . Cancer Sister        colon  . Heart disease Father        heart attack following surgery  . Esophageal cancer  Neg Hx   . Rectal cancer Neg Hx   . Stomach cancer Neg Hx    Social History   Socioeconomic History  . Marital status: Married    Spouse name: Not on file  . Number of children: 3  . Years of education: Not on file  . Highest education level: Not on file  Occupational History  . Occupation: retired  Tobacco Use  . Smoking status: Former Smoker    Types: Cigarettes    Quit date: 04/05/2000    Years since quitting: 19.0  . Smokeless tobacco: Never Used  . Tobacco comment: quit smoking 10 yrs ago  Substance and Sexual Activity  . Alcohol use:  Yes    Comment: weekends and occasional cocktail  . Drug use: No  . Sexual activity: Never  Other Topics Concern  . Not on file  Social History Narrative  . Not on file   Social Determinants of Health   Financial Resource Strain:   . Difficulty of Paying Living Expenses: Not on file  Food Insecurity:   . Worried About Charity fundraiser in the Last Year: Not on file  . Ran Out of Food in the Last Year: Not on file  Transportation Needs:   . Lack of Transportation (Medical): Not on file  . Lack of Transportation (Non-Medical): Not on file  Physical Activity:   . Days of Exercise per Week: Not on file  . Minutes of Exercise per Session: Not on file  Stress:   . Feeling of Stress : Not on file  Social Connections:   . Frequency of Communication with Friends and Family: Not on file  . Frequency of Social Gatherings with Friends and Family: Not on file  . Attends Religious Services: Not on file  . Active Member of Clubs or Organizations: Not on file  . Attends Archivist Meetings: Not on file  . Marital Status: Not on file  Intimate Partner Violence:   . Fear of Current or Ex-Partner: Not on file  . Emotionally Abused: Not on file  . Physically Abused: Not on file  . Sexually Abused: Not on file    Physical Exam: Vital signs in last 24 hours: Temp:  [98.7 F (37.1 C)] 98.7 F (37.1 C) (01/27 0955) Pulse Rate:  [86] 86 (01/27 0955) Resp:  [18] 18 (01/27 0955) BP: (206)/(90) 206/90 (01/27 0955) SpO2:  [100 %] 100 % (01/27 0955) Weight:  [63.5 kg] 63.5 kg (01/27 0955)   GEN: NAD EYE: Sclerae anicteric ENT: MMM CV: Non-tachycardic GI: Soft, NT/ND NEURO:  Alert & Oriented x 3  Lab Results: No results for input(s): WBC, HGB, HCT, PLT in the last 72 hours. BMET No results for input(s): NA, K, CL, CO2, GLUCOSE, BUN, CREATININE, CALCIUM in the last 72 hours. LFT No results for input(s): PROT, ALBUMIN, AST, ALT, ALKPHOS, BILITOT, BILIDIR, IBILI in the last 72  hours. PT/INR No results for input(s): LABPROT, INR in the last 72 hours.   Impression / Plan: This is a 79 y.o.female who presents for EGD/EUS.  The risks of EUS including bleeding, infection, aspiration pneumonia and intestinal perforation were discussed as was the possibility it may not give a definitive diagnosis.  If a biopsy of the pancreas is done as part of the EUS, there is an additional risk of pancreatitis at the rate of about 1%.  It was explained that procedure related pancreatitis is typically mild, although can be severe and even life threatening, which is  why we do not perform random pancreatic biopsies and only biopsy a lesion we feel is concerning enough to warrant the risk.  The risks and benefits of endoscopic evaluation were discussed with the patient; these include but are not limited to the risk of perforation, infection, bleeding, missed lesions, lack of diagnosis, severe illness requiring hospitalization, as well as anesthesia and sedation related illnesses.  The patient is agreeable to proceed.    Justice Britain, MD [facility] Office # [phone]

## 2019-05-03 ENCOUNTER — Telehealth: Payer: Self-pay | Admitting: Gastroenterology

## 2019-05-03 ENCOUNTER — Encounter: Payer: Self-pay | Admitting: *Deleted

## 2019-05-03 LAB — CYTOLOGY - NON PAP

## 2019-05-03 NOTE — Telephone Encounter (Signed)
Left message on machine to call back  

## 2019-05-03 NOTE — Telephone Encounter (Signed)
Patient is calling states she has a bad headache and she would like to know it is a aftermath from her procedure yesterday.

## 2019-05-03 NOTE — Telephone Encounter (Signed)
Dr Rush Landmark the pt called complaining of headache since procedure yesterday.  She took 2 extra strength tylenol last night every 6 hours and has had 2 today.  She has also been pushing fluids.  She is concerned that it has something to do with the antibiotic she was given.  Please advise

## 2019-05-04 NOTE — Telephone Encounter (Signed)
I spoke with the pt this morning and she states that her HA has resolved.  She pushed fluids and woke and felt really well.  She will call with any further concerns.

## 2019-05-04 NOTE — Telephone Encounter (Signed)
Thanks for the update. GM

## 2019-05-04 NOTE — Telephone Encounter (Signed)
Patty, I would not normally think that it would be a result of the antibiotic or necessarily from the procedure or biopsy.  She has had multiple medication allergies to antibiotics however, so anything could be possible.  I agree with the Tylenol for a few days.  If things persist beyond that then she will need to be seen by PCP for Followup as I would be unclear why this would be persisting.  She has tolerated this anesthesia previo usly when procedures were done in the [facility]. Keep me UTD on how she is today. Thanks. GM

## 2019-05-06 ENCOUNTER — Encounter: Payer: Self-pay | Admitting: Gastroenterology

## 2019-05-07 ENCOUNTER — Telehealth: Payer: Self-pay

## 2019-05-07 NOTE — Telephone Encounter (Signed)
Pyrtle, Lajuan Lines, MD  Algernon Huxley, RN; Timothy Lasso, RN  Got this from [facility] as an FYI  I just want to be sure someone has handled the MRI/MRCP he recommended in 3 months (from a reminder standpt)  Thanks  JMP    Reminder placed in epic.

## 2019-05-08 NOTE — Anesthesia Postprocedure Evaluation (Signed)
Anesthesia Post Note  Patient: Pamela Snow  Procedure(s) Performed: UPPER ENDOSCOPIC ULTRASOUND (EUS) RADIAL (N/A ) ESOPHAGOGASTRODUODENOSCOPY (EGD) WITH PROPOFOL (N/A ) FINE NEEDLE ASPIRATION (FNA) LINEAR     Patient location during evaluation: Endoscopy Anesthesia Type: MAC Level of consciousness: awake and alert Pain management: pain level controlled Vital Signs Assessment: post-procedure vital signs reviewed and stable Respiratory status: spontaneous breathing, nonlabored ventilation, respiratory function stable and patient connected to nasal cannula oxygen Cardiovascular status: blood pressure returned to baseline and stable Postop Assessment: no apparent nausea or vomiting Anesthetic complications: no    Last Vitals:  Vitals:   05/02/19 1300 05/02/19 1305  BP: (!) 148/112 (!) 196/89  Pulse: 73 73  Resp: 19 12  Temp:    SpO2: 96% 98%    Last Pain:  Vitals:   05/02/19 1305  TempSrc:   PainSc: 0-No pain                 Ayjah Show S

## 2019-05-11 ENCOUNTER — Telehealth: Payer: Self-pay | Admitting: Gastroenterology

## 2019-05-11 NOTE — Telephone Encounter (Signed)
Patient is calling for results of her procedure she stated she has not yet received the letter we sent out for her. She very anxious and wants to make sure everything Is ok.

## 2019-05-11 NOTE — Telephone Encounter (Signed)
Reviewed path letter with pt over the phone. Reminder in epic for repeat MR/MRCP in 3 mths.

## 2019-05-25 ENCOUNTER — Encounter: Payer: Self-pay | Admitting: Gastroenterology

## 2019-06-18 DIAGNOSIS — D379 Neoplasm of uncertain behavior of digestive organ, unspecified: Secondary | ICD-10-CM | POA: Diagnosis not present

## 2019-06-18 DIAGNOSIS — D374 Neoplasm of uncertain behavior of colon: Secondary | ICD-10-CM | POA: Diagnosis not present

## 2019-06-18 DIAGNOSIS — K862 Cyst of pancreas: Secondary | ICD-10-CM | POA: Diagnosis not present

## 2019-06-29 DIAGNOSIS — Z20822 Contact with and (suspected) exposure to covid-19: Secondary | ICD-10-CM | POA: Diagnosis not present

## 2019-06-29 DIAGNOSIS — Z01812 Encounter for preprocedural laboratory examination: Secondary | ICD-10-CM | POA: Diagnosis not present

## 2019-07-02 DIAGNOSIS — D136 Benign neoplasm of pancreas: Secondary | ICD-10-CM | POA: Diagnosis not present

## 2019-07-02 DIAGNOSIS — Z96653 Presence of artificial knee joint, bilateral: Secondary | ICD-10-CM | POA: Diagnosis not present

## 2019-07-02 DIAGNOSIS — Z881 Allergy status to other antibiotic agents status: Secondary | ICD-10-CM | POA: Diagnosis not present

## 2019-07-02 DIAGNOSIS — K8689 Other specified diseases of pancreas: Secondary | ICD-10-CM | POA: Diagnosis not present

## 2019-07-02 DIAGNOSIS — K219 Gastro-esophageal reflux disease without esophagitis: Secondary | ICD-10-CM | POA: Diagnosis not present

## 2019-07-02 DIAGNOSIS — Z9049 Acquired absence of other specified parts of digestive tract: Secondary | ICD-10-CM | POA: Diagnosis not present

## 2019-07-02 DIAGNOSIS — Z79899 Other long term (current) drug therapy: Secondary | ICD-10-CM | POA: Diagnosis not present

## 2019-07-02 DIAGNOSIS — E039 Hypothyroidism, unspecified: Secondary | ICD-10-CM | POA: Diagnosis not present

## 2019-07-02 DIAGNOSIS — R933 Abnormal findings on diagnostic imaging of other parts of digestive tract: Secondary | ICD-10-CM | POA: Diagnosis not present

## 2019-07-02 DIAGNOSIS — Z888 Allergy status to other drugs, medicaments and biological substances status: Secondary | ICD-10-CM | POA: Diagnosis not present

## 2019-07-02 DIAGNOSIS — N2889 Other specified disorders of kidney and ureter: Secondary | ICD-10-CM | POA: Diagnosis not present

## 2019-07-02 DIAGNOSIS — I10 Essential (primary) hypertension: Secondary | ICD-10-CM | POA: Diagnosis not present

## 2019-07-02 DIAGNOSIS — R69 Illness, unspecified: Secondary | ICD-10-CM | POA: Diagnosis not present

## 2019-08-10 ENCOUNTER — Other Ambulatory Visit: Payer: Self-pay

## 2019-08-10 ENCOUNTER — Telehealth: Payer: Self-pay

## 2019-08-10 DIAGNOSIS — K862 Cyst of pancreas: Secondary | ICD-10-CM

## 2019-08-10 NOTE — Telephone Encounter (Signed)
Called pt to set up MRCP. Pt states she has already had this done at [facility].

## 2019-08-10 NOTE — Telephone Encounter (Signed)
-----   Message from Algernon Huxley, RN sent at 05/07/2019 10:45 AM EST ----- Regarding: scan repeat a MRI/MRCP in 60-months   See letter from Dr. Jannifer Rodney

## 2019-08-21 ENCOUNTER — Other Ambulatory Visit: Payer: Self-pay

## 2019-08-21 ENCOUNTER — Ambulatory Visit (AMBULATORY_SURGERY_CENTER): Payer: Medicare Other | Admitting: *Deleted

## 2019-08-21 VITALS — Ht 68.0 in | Wt 137.0 lb

## 2019-08-21 DIAGNOSIS — Z86010 Personal history of colonic polyps: Secondary | ICD-10-CM

## 2019-08-21 DIAGNOSIS — Z800 Family history of malignant neoplasm of digestive organs: Secondary | ICD-10-CM

## 2019-08-21 MED ORDER — NA SULFATE-K SULFATE-MG SULF 17.5-3.13-1.6 GM/177ML PO SOLN: 1.0000 | Freq: Once | ORAL | 0 refills | Status: AC

## 2019-08-21 NOTE — Progress Notes (Signed)

## 2019-08-22 ENCOUNTER — Ambulatory Visit (HOSPITAL_COMMUNITY): Payer: Medicare HMO

## 2019-08-28 ENCOUNTER — Telehealth: Payer: Self-pay | Admitting: Gastroenterology

## 2019-08-28 NOTE — Telephone Encounter (Signed)
PT STATES SHE WANTS THE SAME PREP SHE HAD IN 2018- SHE HAS BEEN READING ABOUT SUPREP AND IS CONCERNED ABOUT THE SODIUM- INFORMED PT IN 2018 SHE HAD SUPREP- WITH GOOD RESULTS-SHE STATES SHE HAD NO ISSUES WITH SUPREP IN 2018- SHE STATES SHE SHOULD BE FINE SINCE IT THE SAME PREP AND THANKED ME FOR CALLING HER BACK - SHE SAID IT WAS PACKAGED DIFFERENT AND SHE WANTED TO BE SURE SHE COULD TAKE IT

## 2019-08-28 NOTE — Telephone Encounter (Signed)
Patient is requesting to speak with a nurse in reference to her prep medication she stated she has some concerns. Please advise

## 2019-09-04 ENCOUNTER — Other Ambulatory Visit: Payer: Self-pay | Admitting: Internal Medicine

## 2019-09-04 ENCOUNTER — Encounter: Payer: Self-pay | Admitting: Internal Medicine

## 2019-09-04 ENCOUNTER — Ambulatory Visit (AMBULATORY_SURGERY_CENTER): Payer: Medicare HMO | Admitting: Internal Medicine

## 2019-09-04 ENCOUNTER — Other Ambulatory Visit: Payer: Self-pay

## 2019-09-04 VITALS — BP 169/96 | HR 76 | Temp 97.3°F | Resp 14 | Ht 68.0 in

## 2019-09-04 DIAGNOSIS — D128 Benign neoplasm of rectum: Secondary | ICD-10-CM

## 2019-09-04 DIAGNOSIS — D124 Benign neoplasm of descending colon: Secondary | ICD-10-CM

## 2019-09-04 DIAGNOSIS — Z86010 Personal history of colonic polyps: Secondary | ICD-10-CM | POA: Diagnosis not present

## 2019-09-04 DIAGNOSIS — D127 Benign neoplasm of rectosigmoid junction: Secondary | ICD-10-CM | POA: Diagnosis not present

## 2019-09-04 DIAGNOSIS — D123 Benign neoplasm of transverse colon: Secondary | ICD-10-CM

## 2019-09-04 DIAGNOSIS — D120 Benign neoplasm of cecum: Secondary | ICD-10-CM | POA: Diagnosis not present

## 2019-09-04 DIAGNOSIS — D125 Benign neoplasm of sigmoid colon: Secondary | ICD-10-CM

## 2019-09-04 DIAGNOSIS — Z800 Family history of malignant neoplasm of digestive organs: Secondary | ICD-10-CM | POA: Diagnosis not present

## 2019-09-04 DIAGNOSIS — D122 Benign neoplasm of ascending colon: Secondary | ICD-10-CM

## 2019-09-04 DIAGNOSIS — I10 Essential (primary) hypertension: Secondary | ICD-10-CM | POA: Diagnosis not present

## 2019-09-04 DIAGNOSIS — K219 Gastro-esophageal reflux disease without esophagitis: Secondary | ICD-10-CM | POA: Diagnosis not present

## 2019-09-04 MED ORDER — SODIUM CHLORIDE 0.9 % IV SOLN: 500.0000 mL | INTRAVENOUS | Status: DC

## 2019-09-04 NOTE — Patient Instructions (Signed)
Handout on polyps, diverticulosis and hemorrhoids given. ? ?YOU HAD AN ENDOSCOPIC PROCEDURE TODAY AT THE [facility]:   Refer to the procedure report that was given to you for any specific questions about what was found during the examination.  If the procedure report does not answer your questions, please call your gastroenterologist to clarify.  If you requested that your care partner not be given the details of your procedure findings, then the procedure report has been included in a sealed envelope for you to review at your convenience later. ? ?YOU SHOULD EXPECT: Some feelings of bloating in the abdomen. Passage of more gas than usual.  Walking can help get rid of the air that was put into your GI tract during the procedure and reduce the bloating. If you had a lower endoscopy (such as a colonoscopy or flexible sigmoidoscopy) you may notice spotting of blood in your stool or on the toilet paper. If you underwent a bowel prep for your procedure, you may not have a normal bowel movement for a few days. ? ?Please Note:  You might notice some irritation and congestion in your nose or some drainage.  This is from the oxygen used during your procedure.  There is no need for concern and it should clear up in a day or so. ? ?SYMPTOMS TO REPORT IMMEDIATELY: ? ?Following lower endoscopy (colonoscopy or flexible sigmoidoscopy): ? Excessive amounts of blood in the stool ? Significant tenderness or worsening of abdominal pains ? Swelling of the abdomen that is new, acute ? Fever of 100?F or higher ? ? ?For urgent or emergent issues, a gastroenterologist can be reached at any hour by calling [phone]. ?Do not use MyChart messaging for urgent concerns.  ? ? ?DIET:  We do recommend a small meal at first, but then you may proceed to your regular diet.  Drink plenty of fluids but you should avoid alcoholic beverages for 24 hours. ? ?ACTIVITY:  You should plan to take it easy for the rest of today and you  should NOT DRIVE or use heavy machinery until tomorrow (because of the sedation medicines used during the test).   ? ?FOLLOW UP: ?Our staff will call the number listed on your records 48-72 hours following your procedure to check on you and address any questions or concerns that you may have regarding the information given to you following your procedure. If we do not reach you, we will leave a message.  We will attempt to reach you two times.  During this call, we will ask if you have developed any symptoms of COVID 19. If you develop any symptoms (ie: fever, flu-like symptoms, shortness of breath, cough etc.) before then, please call [phone].  If you test positive for Covid 19 in the 2 weeks post procedure, please call and report this information to us.   ? ?If any biopsies were taken you will be contacted by phone or by letter within the next 1-3 weeks.  Please call us at [phone] if you have not heard about the biopsies in 3 weeks.  ? ? ?SIGNATURES/CONFIDENTIALITY: ?You and/or your care partner have signed paperwork which will be entered into your electronic medical record.  These signatures attest to the fact that that the information above on your After Visit Summary has been reviewed and is understood.  Full responsibility of the confidentiality of this discharge information lies with you and/or your care-partner.  ?

## 2019-09-04 NOTE — Progress Notes (Signed)
Vs CW ° °

## 2019-09-04 NOTE — Progress Notes (Signed)
Called to room to assist during endoscopic procedure.  Patient ID and intended procedure confirmed with present staff. Received instructions for my participation in the procedure from the performing physician.  

## 2019-09-04 NOTE — Op Note (Signed)
[facility] Patient Name: Pamela Snow Procedure Date: 09/04/2019 10:00 AM MRN: [id number] Endoscopist: Jerene Bears , MD Age: 79 Referring MD:  Date of Birth: [birth year] Gender: Female Account #: [id number] Procedure:                Colonoscopy Indications:              High risk colon cancer surveillance: Personal                            history of multiple (3 or more) adenomas, Family                            history of colon cancer in multiple first-degree                            relatives, Last colonoscopy 3 years ago Medicines:                Monitored Anesthesia Care Procedure:                Pre-Anesthesia Assessment:                           - Prior to the procedure, a History and Physical                            was performed, and patient medications and                            allergies were reviewed. The patient's tolerance of                            previous anesthesia was also reviewed. The risks                            and benefits of the procedure and the sedation                            options and risks were discussed with the patient.                            All questions were answered, and informed consent                            was obtained. Prior Anticoagulants: The patient has                            taken no previous anticoagulant or antiplatelet                            agents. ASA Grade Assessment: II - A patient with                            mild systemic disease. After reviewing the risks  and benefits, the patient was deemed in                            satisfactory condition to undergo the procedure.                           After obtaining informed consent, the colonoscope                            was passed under direct vision. Throughout the                            procedure, the patient's blood pressure, pulse, and                            oxygen saturations were  monitored continuously. The                            Colonoscope was introduced through the anus and                            advanced to the cecum, identified by appendiceal                            orifice and ileocecal valve. The colonoscopy was                            performed without difficulty. The patient tolerated                            the procedure well. The quality of the bowel                            preparation was good. The ileocecal valve,                            appendiceal orifice, and rectum were photographed. Scope In: 10:32:23 AM Scope Out: 10:58:33 AM Scope Withdrawal Time: 0 hours 18 minutes 23 seconds  Total Procedure Duration: 0 hours 26 minutes 10 seconds  Findings:                 Skin tags were found on perianal exam.                           Two sessile polyps were found in the cecum. The                            polyps were 3 to 5 mm in size. These polyps were                            removed with a cold snare. Resection and retrieval                            were complete.  Two sessile polyps were found in the ascending                            colon. The polyps were 2 to 4 mm in size. These                            polyps were removed with a cold snare. Resection                            and retrieval were complete.                           Two sessile polyps were found in the transverse                            colon. The polyps were 4 to 6 mm in size. These                            polyps were removed with a cold snare. Resection                            and retrieval were complete.                           A 4 mm polyp was found in the descending colon. The                            polyp was sessile. The polyp was removed with a                            cold snare. Resection and retrieval were complete.                           A 3 mm polyp was found in the sigmoid colon. The                             polyp was sessile. The polyp was removed with a                            cold snare. Resection and retrieval were complete.                           A 5 mm polyp was found in the recto-sigmoid colon.                            The polyp was sessile. The polyp was removed with a                            cold snare. Resection and retrieval were complete.                           A 4  mm polyp was found in the rectum. The polyp was                            sessile. The polyp was removed with a cold snare.                            Resection and retrieval were complete.                           There was evidence of a prior end-to-side                            colo-colonic anastomosis in the distal sigmoid                            colon. This was patent and was characterized by                            healthy appearing mucosa and visible sutures.                           Multiple small and large-mouthed diverticula were                            found in the sigmoid colon, descending colon and                            ascending colon.                           Internal hemorrhoids were found during                            retroflexion. The hemorrhoids were small. Complications:            No immediate complications. Estimated Blood Loss:     Estimated blood loss was minimal. Impression:               - Perianal skin tags found on perianal exam.                           - Two 3 to 5 mm polyps in the cecum, removed with a                            cold snare. Resected and retrieved.                           - Two 2 to 4 mm polyps in the ascending colon,                            removed with a cold snare. Resected and retrieved.                           - Two 4 to 6 mm polyps in the transverse  colon,                            removed with a cold snare. Resected and retrieved.                           - One 4 mm polyp in the descending colon, removed                             with a cold snare. Resected and retrieved.                           - One 3 mm polyp in the sigmoid colon, removed with                            a cold snare. Resected and retrieved.                           - One 5 mm polyp at the recto-sigmoid colon,                            removed with a cold snare. Resected and retrieved.                           - One 4 mm polyp in the rectum, removed with a cold                            snare. Resected and retrieved.                           - Patent end-to-side colo-colonic anastomosis,                            characterized by healthy appearing mucosa and                            visible sutures.                           - Diverticulosis in the sigmoid colon, in the                            descending colon and in the ascending colon.                           - Small internal hemorrhoids. Recommendation:           - Patient has a contact number available for                            emergencies. The signs and symptoms of potential                            delayed complications were discussed with the  patient. Return to normal activities tomorrow.                            Written discharge instructions were provided to the                            patient.                           - Resume previous diet.                           - Continue present medications.                           - Await pathology results.                           - Repeat colonoscopy is recommended for                            surveillance. The colonoscopy date will be                            determined after pathology results from today's                            exam become available for review. Jerene Bears, MD 09/04/2019 11:04:53 AM This report has been signed electronically.

## 2019-09-04 NOTE — Progress Notes (Signed)
Report to PACU, RN, vss, BBS= Clear.  

## 2019-09-06 ENCOUNTER — Telehealth: Payer: Self-pay | Admitting: *Deleted

## 2019-09-06 NOTE — Telephone Encounter (Signed)
First follow up call attempt.  Message left on voicemail. 

## 2019-09-06 NOTE — Telephone Encounter (Signed)
Second follow up call attempt.  Message left to call if any questions or concerns.

## 2019-09-09 ENCOUNTER — Encounter: Payer: Self-pay | Admitting: Internal Medicine

## 2019-09-19 DIAGNOSIS — M47816 Spondylosis without myelopathy or radiculopathy, lumbar region: Secondary | ICD-10-CM | POA: Diagnosis not present

## 2019-09-27 DIAGNOSIS — D692 Other nonthrombocytopenic purpura: Secondary | ICD-10-CM | POA: Diagnosis not present

## 2020-01-03 DIAGNOSIS — J069 Acute upper respiratory infection, unspecified: Secondary | ICD-10-CM | POA: Diagnosis not present

## 2020-01-03 DIAGNOSIS — Z20822 Contact with and (suspected) exposure to covid-19: Secondary | ICD-10-CM | POA: Diagnosis not present

## 2020-01-10 DIAGNOSIS — K862 Cyst of pancreas: Secondary | ICD-10-CM | POA: Diagnosis not present

## 2020-01-10 DIAGNOSIS — D136 Benign neoplasm of pancreas: Secondary | ICD-10-CM | POA: Diagnosis not present

## 2020-01-14 DIAGNOSIS — K862 Cyst of pancreas: Secondary | ICD-10-CM | POA: Diagnosis not present

## 2020-01-23 ENCOUNTER — Telehealth: Payer: Self-pay | Admitting: Internal Medicine

## 2020-01-23 NOTE — Telephone Encounter (Signed)
Patient has a history of diverticulosis and symptoms are most likely related to diverticulitis Antibiotics are usually used to control this inflammation and prevent complication However, she has multiple antibiotic allergies including penicillin, quinolones, doxycycline, sulfa. Please asked the patient if there is any antibiotic that she has taken in the past which she can tolerate. We may need input from PCP given his records Thanks JMP

## 2020-01-23 NOTE — Telephone Encounter (Signed)
Pt states she has a h/o diverticulitis and has had surgery for it in the past. States 10 days ago she started having some stomach discomfort and gas. She changed her diet to a bland diet and has not been eating as much. No she has no appetite and she is having pain in her LLQ and RLQ, reports the pain is worse when she walks. She does not have the heavy feeling that she has had in the past when she had diverticulitis. Please advise.

## 2020-01-24 ENCOUNTER — Other Ambulatory Visit: Payer: Self-pay

## 2020-01-24 MED ORDER — CEFDINIR 300 MG PO CAPS
300.0000 mg | ORAL_CAPSULE | Freq: Two times a day (BID) | ORAL | 0 refills | Status: DC
Start: 2020-01-24 — End: 2020-10-01

## 2020-01-24 NOTE — Telephone Encounter (Signed)
Spoke with pt and she states we should probably check with Dr. Virgina Jock. Reports she has not been on an antibiotic in about a year so she may be able to tolerate something. She was not sure but knows she can not take amoxicillin, doxy or bactrim.

## 2020-01-24 NOTE — Telephone Encounter (Signed)
Call placed to Dr. Keane Police nurse, left message for her to call back to let us know what antibiotic Dr. Virgina Jock would recommend.   Office called back and Dr. Virgina Jock would like to speak with Dr. Hilarie Fredrickson. Gave office RN Dr. Vena Rua cell number.

## 2020-01-24 NOTE — Telephone Encounter (Signed)
Can you check with Dr. Keane Police RN, and if needed I can call him too.  I will need his # Thanks JMP

## 2020-01-24 NOTE — Telephone Encounter (Signed)
Spoke with pt and she is aware. Script sent to pts pharmacy , she knows to provide [place] with an update.

## 2020-01-24 NOTE — Telephone Encounter (Signed)
I spoke to Dr. Virgina Jock with [facility] by phone  He was able to use his record to determine that she took cefdinir in December 2018 without problems. The normal antibiotics which I would consider for diverticulitis have caused significant reactions/allergy for her in the past  Please prescribe cefdinir 300 mg twice daily x7 days Please explained to her that with her history there is allergy possible though she has tolerated this in the past.  If she develops rash, fever, swelling, shortness of breath she needs to go to the emergency room immediately Also asked that she let me know how this medication is working and if her diverticulitis symptoms are improving  Thanks JMP

## 2020-01-28 DIAGNOSIS — Z0000 Encounter for general adult medical examination without abnormal findings: Secondary | ICD-10-CM | POA: Diagnosis not present

## 2020-01-28 DIAGNOSIS — E538 Deficiency of other specified B group vitamins: Secondary | ICD-10-CM | POA: Diagnosis not present

## 2020-01-28 DIAGNOSIS — I700 Atherosclerosis of aorta: Secondary | ICD-10-CM | POA: Diagnosis not present

## 2020-01-28 DIAGNOSIS — M8580 Other specified disorders of bone density and structure, unspecified site: Secondary | ICD-10-CM | POA: Diagnosis not present

## 2020-01-28 DIAGNOSIS — D692 Other nonthrombocytopenic purpura: Secondary | ICD-10-CM | POA: Diagnosis not present

## 2020-01-28 DIAGNOSIS — R030 Elevated blood-pressure reading, without diagnosis of hypertension: Secondary | ICD-10-CM | POA: Diagnosis not present

## 2020-01-28 DIAGNOSIS — R69 Illness, unspecified: Secondary | ICD-10-CM | POA: Diagnosis not present

## 2020-01-28 DIAGNOSIS — E210 Primary hyperparathyroidism: Secondary | ICD-10-CM | POA: Diagnosis not present

## 2020-01-28 DIAGNOSIS — E559 Vitamin D deficiency, unspecified: Secondary | ICD-10-CM | POA: Diagnosis not present

## 2020-01-28 DIAGNOSIS — E039 Hypothyroidism, unspecified: Secondary | ICD-10-CM | POA: Diagnosis not present

## 2020-02-22 DIAGNOSIS — I10 Essential (primary) hypertension: Secondary | ICD-10-CM | POA: Diagnosis not present

## 2020-02-22 DIAGNOSIS — Z9049 Acquired absence of other specified parts of digestive tract: Secondary | ICD-10-CM | POA: Diagnosis not present

## 2020-02-22 DIAGNOSIS — Z8719 Personal history of other diseases of the digestive system: Secondary | ICD-10-CM | POA: Diagnosis not present

## 2020-02-22 DIAGNOSIS — Z8639 Personal history of other endocrine, nutritional and metabolic disease: Secondary | ICD-10-CM | POA: Diagnosis not present

## 2020-02-22 DIAGNOSIS — E039 Hypothyroidism, unspecified: Secondary | ICD-10-CM | POA: Diagnosis not present

## 2020-02-22 DIAGNOSIS — E538 Deficiency of other specified B group vitamins: Secondary | ICD-10-CM | POA: Diagnosis not present

## 2020-03-03 DIAGNOSIS — R1030 Lower abdominal pain, unspecified: Secondary | ICD-10-CM | POA: Diagnosis not present

## 2020-03-10 DIAGNOSIS — K5900 Constipation, unspecified: Secondary | ICD-10-CM | POA: Diagnosis not present

## 2020-03-10 DIAGNOSIS — Z8719 Personal history of other diseases of the digestive system: Secondary | ICD-10-CM | POA: Diagnosis not present

## 2020-03-10 DIAGNOSIS — Z9049 Acquired absence of other specified parts of digestive tract: Secondary | ICD-10-CM | POA: Diagnosis not present

## 2020-03-10 DIAGNOSIS — R140 Abdominal distension (gaseous): Secondary | ICD-10-CM | POA: Diagnosis not present

## 2020-03-10 DIAGNOSIS — R1031 Right lower quadrant pain: Secondary | ICD-10-CM | POA: Diagnosis not present

## 2020-03-13 DIAGNOSIS — M47816 Spondylosis without myelopathy or radiculopathy, lumbar region: Secondary | ICD-10-CM | POA: Diagnosis not present

## 2020-03-21 DIAGNOSIS — R102 Pelvic and perineal pain: Secondary | ICD-10-CM | POA: Diagnosis not present

## 2020-03-21 DIAGNOSIS — R1031 Right lower quadrant pain: Secondary | ICD-10-CM | POA: Diagnosis not present

## 2020-04-03 DIAGNOSIS — M4126 Other idiopathic scoliosis, lumbar region: Secondary | ICD-10-CM | POA: Diagnosis not present

## 2020-04-03 DIAGNOSIS — M47816 Spondylosis without myelopathy or radiculopathy, lumbar region: Secondary | ICD-10-CM | POA: Diagnosis not present

## 2020-07-22 ENCOUNTER — Telehealth: Payer: Self-pay | Admitting: Internal Medicine

## 2020-07-22 ENCOUNTER — Encounter: Payer: Self-pay | Admitting: Internal Medicine

## 2020-07-22 NOTE — Telephone Encounter (Signed)
Yes, ok for surveillance colonoscopy with me

## 2020-07-22 NOTE — Telephone Encounter (Signed)
Inbound call from patient. She is due a recall colonoscopy and would like it to be with you again. She have been seen at [facility] for gastro ultrasound for pancreas. Please advise on scheduling.

## 2020-08-07 ENCOUNTER — Other Ambulatory Visit: Payer: Self-pay | Admitting: Internal Medicine

## 2020-08-07 DIAGNOSIS — Z1231 Encounter for screening mammogram for malignant neoplasm of breast: Secondary | ICD-10-CM

## 2020-09-17 ENCOUNTER — Other Ambulatory Visit: Payer: Self-pay

## 2020-09-17 ENCOUNTER — Ambulatory Visit (AMBULATORY_SURGERY_CENTER): Payer: Medicare HMO

## 2020-09-17 VITALS — Ht 67.5 in | Wt 133.0 lb

## 2020-09-17 DIAGNOSIS — Z86010 Personal history of colonic polyps: Secondary | ICD-10-CM

## 2020-09-17 DIAGNOSIS — Z800 Family history of malignant neoplasm of digestive organs: Secondary | ICD-10-CM

## 2020-09-17 MED ORDER — NA SULFATE-K SULFATE-MG SULF 17.5-3.13-1.6 GM/177ML PO SOLN: 1.0000 | Freq: Once | ORAL | 0 refills | Status: AC

## 2020-09-17 NOTE — Progress Notes (Signed)
Pt verified name, DOB, address and insurance during PV today.   Pt mailed instruction packet to include copy of consent form to read and not return, and instructions.  PV completed over the phone. Pt encouraged to call with questions or issues. My Chart instructions to pt as well     No allergies to soy or egg Pt is not on blood thinners or diet pills Denies issues with sedation/intubation Denies atrial flutter/fib Denies constipation   Emmi instructions given to pt  Pt is aware of Covid safety and care partner requirements.   Pt wants suprep; She realizes that cost may be over 100 dollars.

## 2020-09-29 ENCOUNTER — Encounter: Payer: Self-pay | Admitting: Internal Medicine

## 2020-09-30 ENCOUNTER — Ambulatory Visit: Payer: Medicare HMO

## 2020-10-01 ENCOUNTER — Other Ambulatory Visit: Payer: Self-pay

## 2020-10-01 ENCOUNTER — Ambulatory Visit (AMBULATORY_SURGERY_CENTER): Payer: Medicare HMO | Admitting: Internal Medicine

## 2020-10-01 ENCOUNTER — Encounter: Payer: Self-pay | Admitting: Internal Medicine

## 2020-10-01 VITALS — BP 168/94 | HR 64 | Temp 97.9°F | Resp 18 | Ht 68.0 in | Wt 133.0 lb

## 2020-10-01 DIAGNOSIS — Z86010 Personal history of colonic polyps: Secondary | ICD-10-CM

## 2020-10-01 DIAGNOSIS — D120 Benign neoplasm of cecum: Secondary | ICD-10-CM

## 2020-10-01 DIAGNOSIS — D124 Benign neoplasm of descending colon: Secondary | ICD-10-CM | POA: Diagnosis not present

## 2020-10-01 HISTORY — PX: COLONOSCOPY: SHX174

## 2020-10-01 MED ORDER — SODIUM CHLORIDE 0.9 % IV SOLN: 500.0000 mL | Freq: Once | INTRAVENOUS | Status: DC

## 2020-10-01 NOTE — Op Note (Signed)
[facility] Patient Name: Pamela Snow Procedure Date: 10/01/2020 2:56 PM MRN: [id number] Endoscopist: Jerene Bears , MD Age: 80 Referring MD:  Date of Birth: [birth year] Gender: Female Account #: [id number] Procedure:                Colonoscopy Indications:              High risk colon cancer surveillance: Personal                            history of multiple adenomas (10 on last exam),                            Last colonoscopy: June 2021; family history of                            colon cancer in multiple 1st degree relatives Medicines:                Monitored Anesthesia Care Procedure:                Pre-Anesthesia Assessment:                           - Prior to the procedure, a History and Physical                            was performed, and patient medications and                            allergies were reviewed. The patient's tolerance of                            previous anesthesia was also reviewed. The risks                            and benefits of the procedure and the sedation                            options and risks were discussed with the patient.                            All questions were answered, and informed consent                            was obtained. Prior Anticoagulants: The patient has                            taken no previous anticoagulant or antiplatelet                            agents. ASA Grade Assessment: II - A patient with                            mild systemic disease. After reviewing the risks  and benefits, the patient was deemed in                            satisfactory condition to undergo the procedure.                           After obtaining informed consent, the colonoscope                            was passed under direct vision. Throughout the                            procedure, the patient's blood pressure, pulse, and                            oxygen saturations were  monitored continuously. The                            Olympus [id number]-[id number] (#[id number]) Colonoscope was                            introduced through the anus and advanced to the                            cecum, identified by appendiceal orifice and                            ileocecal valve. The colonoscopy was performed                            without difficulty. The patient tolerated the                            procedure well. The quality of the bowel                            preparation was good. The ileocecal valve,                            appendiceal orifice, and rectum were photographed. Scope In: 3:07:07 PM Scope Out: 3:25:58 PM Scope Withdrawal Time: 0 hours 14 minutes 17 seconds  Total Procedure Duration: 0 hours 18 minutes 51 seconds  Findings:                 The digital rectal exam was normal.                           A 3 mm polyp was found in the cecum. The polyp was                            sessile. The polyp was removed with a cold snare.                            Resection and retrieval were complete.  A 4 mm polyp was found in the descending colon. The                            polyp was sessile. The polyp was removed with a                            cold snare. Resection and retrieval were complete.                           There was evidence of a prior end-to-side                            colo-colonic anastomosis in the distal sigmoid                            colon. This was patent and was characterized by                            visible sutures.                           Multiple small and large-mouthed diverticula were                            found in the sigmoid colon, descending colon and                            ascending colon.                           Internal hemorrhoids were found during                            retroflexion. The hemorrhoids were small. Complications:            No immediate  complications. Estimated Blood Loss:     Estimated blood loss was minimal. Impression:               - One 3 mm polyp in the cecum, removed with a cold                            snare. Resected and retrieved.                           - One 4 mm polyp in the descending colon, removed                            with a cold snare. Resected and retrieved.                           - Patent end-to-side colo-colonic anastomosis,                            characterized by visible sutures.                           -  Diverticulosis in the sigmoid colon, in the                            descending colon and in the ascending colon.                           - Internal hemorrhoids. Recommendation:           - Patient has a contact number available for                            emergencies. The signs and symptoms of potential                            delayed complications were discussed with the                            patient. Return to normal activities tomorrow.                            Written discharge instructions were provided to the                            patient.                           - Resume previous diet.                           - Continue present medications.                           - Await pathology results.                           - Repeat colonoscopy may be recommended for                            surveillance. The colonoscopy date will be                            determined after pathology results from today's                            exam become available for review. Jerene Bears, MD 10/01/2020 3:31:08 PM This report has been signed electronically.

## 2020-10-01 NOTE — Patient Instructions (Signed)
Please read handouts provided. Continue present medications. Await pathology results.   YOU HAD AN ENDOSCOPIC PROCEDURE TODAY AT THE [facility]:   Refer to the procedure report that was given to you for any specific questions about what was found during the examination.  If the procedure report does not answer your questions, please call your gastroenterologist to clarify.  If you requested that your care partner not be given the details of your procedure findings, then the procedure report has been included in a sealed envelope for you to review at your convenience later.  YOU SHOULD EXPECT: Some feelings of bloating in the abdomen. Passage of more gas than usual.  Walking can help get rid of the air that was put into your GI tract during the procedure and reduce the bloating. If you had a lower endoscopy (such as a colonoscopy or flexible sigmoidoscopy) you may notice spotting of blood in your stool or on the toilet paper. If you underwent a bowel prep for your procedure, you may not have a normal bowel movement for a few days.  Please Note:  You might notice some irritation and congestion in your nose or some drainage.  This is from the oxygen used during your procedure.  There is no need for concern and it should clear up in a day or so.  SYMPTOMS TO REPORT IMMEDIATELY:  Following lower endoscopy (colonoscopy or flexible sigmoidoscopy):  Excessive amounts of blood in the stool  Significant tenderness or worsening of abdominal pains  Swelling of the abdomen that is new, acute  Fever of 100F or higher   For urgent or emergent issues, a gastroenterologist can be reached at any hour by calling [phone]. Do not use MyChart messaging for urgent concerns.    DIET:  We do recommend a small meal at first, but then you may proceed to your regular diet.  Drink plenty of fluids but you should avoid alcoholic beverages for 24 hours.  ACTIVITY:  You should plan to take it easy  for the rest of today and you should NOT DRIVE or use heavy machinery until tomorrow (because of the sedation medicines used during the test).    FOLLOW UP: Our staff will call the number listed on your records 48-72 hours following your procedure to check on you and address any questions or concerns that you may have regarding the information given to you following your procedure. If we do not reach you, we will leave a message.  We will attempt to reach you two times.  During this call, we will ask if you have developed any symptoms of COVID 19. If you develop any symptoms (ie: fever, flu-like symptoms, shortness of breath, cough etc.) before then, please call [phone].  If you test positive for Covid 19 in the 2 weeks post procedure, please call and report this information to us.    If any biopsies were taken you will be contacted by phone or by letter within the next 1-3 weeks.  Please call us at [phone] if you have not heard about the biopsies in 3 weeks.    SIGNATURES/CONFIDENTIALITY: You and/or your care partner have signed paperwork which will be entered into your electronic medical record.  These signatures attest to the fact that that the information above on your After Visit Summary has been reviewed and is understood.  Full responsibility of the confidentiality of this discharge information lies with you and/or your care-partner.  

## 2020-10-01 NOTE — Progress Notes (Signed)
pt tolerated well. VSS. awake and to recovery. Report given to RN.  

## 2020-10-01 NOTE — Progress Notes (Signed)
Called to room to assist during endoscopic procedure.  Patient ID and intended procedure confirmed with present staff. Received instructions for my participation in the procedure from the performing physician.  

## 2020-10-01 NOTE — Progress Notes (Signed)
Vitals by CW.  Pt's states no medical or surgical changes since previsit or office visit.

## 2020-10-03 ENCOUNTER — Telehealth: Payer: Self-pay | Admitting: *Deleted

## 2020-10-03 NOTE — Telephone Encounter (Signed)
  Follow up Call-  Call back number 10/01/2020 09/04/2019  Post procedure Call Back phone  # [phone]  Permission to leave phone message Yes Yes  Some recent data might be hidden    [facility]

## 2020-10-03 NOTE — Telephone Encounter (Signed)
  Follow up Call-  Call back number 10/01/2020 09/04/2019  Post procedure Call Back phone  # [phone]  Permission to leave phone message Yes Yes  Some recent data might be hidden     Patient questions:  Do you have a fever, pain , or abdominal swelling? No. Pain Score  0 *  Have you tolerated food without any problems? Yes.    Have you been able to return to your normal activities? Yes.    Do you have any questions about your discharge instructions: Diet   No. Medications  No. Follow up visit  No.  Do you have questions or concerns about your Care? No.  Actions: * If pain score is 4 or above: No action needed, pain <4.  Have you developed a fever since your procedure? no  2.   Have you had an respiratory symptoms (SOB or cough) since your procedure? no  3.   Have you tested positive for COVID 19 since your procedure no  4.   Have you had any family members/close contacts diagnosed with the COVID 19 since your procedure?  no   If yes to any of these questions please route to Joylene John, RN and Joella Prince, RN

## 2020-10-08 ENCOUNTER — Encounter: Payer: Self-pay | Admitting: Internal Medicine

## 2020-12-08 ENCOUNTER — Encounter: Payer: Self-pay | Admitting: Internal Medicine

## 2021-07-22 ENCOUNTER — Telehealth: Payer: Self-pay | Admitting: Internal Medicine

## 2021-07-22 NOTE — Telephone Encounter (Signed)
Patient states that she was seen at her pcp and was sent for a ct scan to rule out appendicitis but turns out she was dx with diverticulitis and would like to speak to nurse regarding matter. ?

## 2021-07-22 NOTE — Telephone Encounter (Signed)
?Pt saw her PCP, he thought she had appendicitis but ct shows she has diverticulitis. Pt states she cannot take antibiotics because she is allergic to all but one. Her PCP told her to go on a clear liquid diet. This is the first bout of diverticulitis since her surgery. Reports she had pain on the right side and a temp of 100.5 last night. She wants to know if Dr. Hilarie Fredrickson thinks she needs to do anything else. Please advise. ? ? ? ? ? ?CT Abdomen Pelvis W Contrast ? ?Anatomical Region Laterality Modality  ?Abdomen -- Computed Tomography  ?Pelvis -- --  ? ?Impression ? ? ?--Acute distal descending colonic diverticulitis. No free air or abscess formation. Consider correlation with direct visualization upon resolution of symptoms to exclude underlying neoplasm.  ? ?--Small volume pelvic fluid, likely reactive.  ? ?--Similar cystic pancreatic tail lesion, consistent with biopsy-proven serous cystadenoma. ?Narrative ? ?EXAM: CT ABDOMEN PELVIS W CONTRAST  ?DATE: 07/22/2021 1:27 PM  ?ACCESSION: [id number] UN  ?DICTATED: 07/22/2021 1:36 PM  ?INTERPRETATION LOCATION: [place]  ? 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 ?COMPARISON: CT abdomen pelvis 01/08/2021  ? ?TECHNIQUE: A helical CT scan of the abdomen and pelvis was obtained following IV contrast from the lung bases through the pubic symphysis. Images were reconstructed in the axial plane. Coronal and sagittal reformatted images were also provided for further evaluation.  ? ? ?FINDINGS:  ? ?LOWER CHEST: Subsegmental bibasilar atelectasis/scarring. No consolidation. No pleural or pericardial effusion.  ? ?LIVER: Normal liver contour. Unchanged subcentimeter segment II lesion (2:13), too small to further characterize. No suspicious  focal liver lesions.  ? ?BILIARY: The gallbladder is surgically absent. Similar mild intrahepatic and extrahepatic biliary ductal dilatation, likely secondary to post cystectomy state.  ? ?SPLEEN: Normal in size and contour.  ? ?PANCREAS: Normal pancreatic contour. 1.6 cm hypodense lesion along the pancreatic tail (2:31), previously 1.5 cm when measured similarly, consistent with biopsy proven serous cystadenoma. No new focal lesions.  No ductal dilation.  ? ?ADRENAL GLANDS: Mild thickening of the left adrenal gland, unchanged. The right adrenal gland is normal.  ? ?KIDNEYS/URETERS: Symmetric renal enhancement.  No hydronephrosis.  No solid renal mass. Similar bilateral renal cysts. Additional subcentimeter bilateral renal hypodensities are too small to further characterize but similar compared to prior.  ? ?BLADDER: Underdistended, limiting evaluation.  ? ?REPRODUCTIVE ORGANS: The uterus is surgically absent. No suspicious adnexal masses.  ? ?GI TRACT: Sequelae of prior partial sigmoid colectomy with anastomosis in the pelvis (2:88). Colonic diverticulosis. Moderate wall thickening of the distal descending colon with a focally inflamed diverticulum (2:86-89, 4:50). No findings of bowel obstruction.    ? ?PERITONEUM, RETROPERITONEUM AND MESENTERY: No free air.  Small volume pelvic fluid.   Right gluteal subcutaneous granulomata.  ? ?LYMPH NODES: No adenopathy.  ? ?VESSELS: The hepatic veins are patent. The portal venous system is patent. Normal caliber abdominal aorta. Scattered aortic atherosclerosis. Normal IVC.    ? ?BONES and SOFT TISSUES: Multilevel degenerative changes of the spine. Similar sclerosis of the right sacroiliac joint. Mild levoscoliosis of the lumbar spine. No aggressive osseous lesions.  Postsurgical changes of ventral abdominal wall. ?Procedure Note ? ?Charlott Rakes, MD - 07/22/2021  ?Formatting of this note might be different from the original.  ?EXAM: CT  ABDOMEN PELVIS W CONTRAST  ?DATE:  07/22/2021 1:27 PM  ?ACCESSION: [id number] UN  ?DICTATED: 07/22/2021 1:36 PM  ?INTERPRETATION LOCATION: [place]  ? 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

## 2021-07-23 NOTE — Telephone Encounter (Signed)
Difficult situation with definitive diverticulitis by CT scan with multiple antibiotic allergies ?If we are treating this without antibiotics I would recommend clear liquid diet, Tylenol as needed up to 4000 mg in any 24-hour period to control discomfort and fever ?If worsening she needs to go to the emergency room where antibiotics would be needed along with observation given her multiple allergies ?Office visit with me for continuity when possible ?

## 2021-07-23 NOTE — Telephone Encounter (Signed)
Left message for pt to call back. ? ?Spoke with pt and she is aware of Dr. Quentin Mulling recommendations. ?

## 2021-07-23 NOTE — Telephone Encounter (Signed)
Patient returned your call, please advise.
